# Patient Record
Sex: Female | Born: 1950
Health system: Southern US, Community
[De-identification: ages and names within clinical notes are randomized; demographics above are authoritative.]

## PROBLEM LIST (undated history)

## (undated) DIAGNOSIS — G4730 Sleep apnea, unspecified: Secondary | ICD-10-CM

## (undated) DIAGNOSIS — I10 Essential (primary) hypertension: Secondary | ICD-10-CM

## (undated) DIAGNOSIS — Z8489 Family history of other specified conditions: Secondary | ICD-10-CM

## (undated) DIAGNOSIS — K859 Acute pancreatitis without necrosis or infection, unspecified: Secondary | ICD-10-CM

## (undated) DIAGNOSIS — M1990 Unspecified osteoarthritis, unspecified site: Secondary | ICD-10-CM

## (undated) DIAGNOSIS — Z9289 Personal history of other medical treatment: Secondary | ICD-10-CM

## (undated) DIAGNOSIS — D649 Anemia, unspecified: Secondary | ICD-10-CM

## (undated) DIAGNOSIS — Z8639 Personal history of other endocrine, nutritional and metabolic disease: Secondary | ICD-10-CM

## (undated) DIAGNOSIS — M069 Rheumatoid arthritis, unspecified: Secondary | ICD-10-CM

## (undated) DIAGNOSIS — Z974 Presence of external hearing-aid: Secondary | ICD-10-CM

## (undated) DIAGNOSIS — J45909 Unspecified asthma, uncomplicated: Secondary | ICD-10-CM

## (undated) DIAGNOSIS — J40 Bronchitis, not specified as acute or chronic: Secondary | ICD-10-CM

## (undated) DIAGNOSIS — E039 Hypothyroidism, unspecified: Secondary | ICD-10-CM

## (undated) DIAGNOSIS — Z889 Allergy status to unspecified drugs, medicaments and biological substances status: Secondary | ICD-10-CM

## (undated) DIAGNOSIS — Z973 Presence of spectacles and contact lenses: Secondary | ICD-10-CM

## (undated) HISTORY — DX: Personal history of other endocrine, nutritional and metabolic disease: Z86.39

## (undated) HISTORY — PX: CHOLECYSTECTOMY: SHX55

## (undated) HISTORY — PX: COLONOSCOPY: SHX174

## (undated) HISTORY — DX: Unspecified asthma, uncomplicated: J45.909

## (undated) HISTORY — DX: Rheumatoid arthritis, unspecified: M06.9

## (undated) HISTORY — PX: OVARIAN CYST SURGERY: SHX726

---

## 2001-04-21 ENCOUNTER — Encounter (HOSPITAL_COMMUNITY): Admission: RE | Admit: 2001-04-21 | Discharge: 2001-05-21 | Payer: Self-pay | Admitting: Rheumatology

## 2001-07-21 ENCOUNTER — Encounter (HOSPITAL_COMMUNITY): Admission: RE | Admit: 2001-07-21 | Discharge: 2001-08-20 | Payer: Self-pay | Admitting: Rheumatology

## 2001-11-10 ENCOUNTER — Encounter (HOSPITAL_COMMUNITY): Admission: RE | Admit: 2001-11-10 | Discharge: 2001-12-10 | Payer: Self-pay | Admitting: Rheumatology

## 2002-03-02 ENCOUNTER — Encounter (HOSPITAL_COMMUNITY): Admission: RE | Admit: 2002-03-02 | Discharge: 2002-04-01 | Payer: Self-pay | Admitting: Rheumatology

## 2002-05-30 ENCOUNTER — Encounter (HOSPITAL_COMMUNITY): Admission: RE | Admit: 2002-05-30 | Discharge: 2002-06-29 | Payer: Self-pay | Admitting: Rheumatology

## 2002-08-23 ENCOUNTER — Encounter (HOSPITAL_COMMUNITY): Admission: RE | Admit: 2002-08-23 | Discharge: 2002-09-22 | Payer: Self-pay | Admitting: Rheumatology

## 2002-11-23 ENCOUNTER — Encounter (HOSPITAL_COMMUNITY): Admission: RE | Admit: 2002-11-23 | Discharge: 2002-12-23 | Payer: Self-pay | Admitting: Rheumatology

## 2003-01-11 ENCOUNTER — Encounter (HOSPITAL_COMMUNITY): Admission: RE | Admit: 2003-01-11 | Discharge: 2003-02-10 | Payer: Self-pay | Admitting: Rheumatology

## 2003-02-28 ENCOUNTER — Encounter (HOSPITAL_COMMUNITY): Admission: RE | Admit: 2003-02-28 | Discharge: 2003-03-30 | Payer: Self-pay | Admitting: Rheumatology

## 2003-05-10 ENCOUNTER — Encounter (HOSPITAL_COMMUNITY): Admission: RE | Admit: 2003-05-10 | Discharge: 2003-06-09 | Payer: Self-pay | Admitting: Rheumatology

## 2003-05-24 ENCOUNTER — Encounter: Payer: Self-pay | Admitting: Rheumatology

## 2003-08-30 ENCOUNTER — Encounter (HOSPITAL_COMMUNITY): Admission: RE | Admit: 2003-08-30 | Discharge: 2003-09-29 | Payer: Self-pay | Admitting: Rheumatology

## 2004-10-25 ENCOUNTER — Ambulatory Visit (HOSPITAL_COMMUNITY): Admission: RE | Admit: 2004-10-25 | Discharge: 2004-10-25 | Payer: Self-pay | Admitting: Internal Medicine

## 2006-09-17 ENCOUNTER — Inpatient Hospital Stay (HOSPITAL_COMMUNITY): Admission: RE | Admit: 2006-09-17 | Discharge: 2006-09-22 | Payer: Self-pay | Admitting: Family Medicine

## 2006-09-20 ENCOUNTER — Encounter (INDEPENDENT_AMBULATORY_CARE_PROVIDER_SITE_OTHER): Payer: Self-pay | Admitting: Specialist

## 2006-09-21 ENCOUNTER — Ambulatory Visit: Payer: Self-pay | Admitting: Internal Medicine

## 2006-09-28 ENCOUNTER — Ambulatory Visit (HOSPITAL_COMMUNITY): Admission: RE | Admit: 2006-09-28 | Discharge: 2006-09-28 | Payer: Self-pay | Admitting: General Surgery

## 2006-10-04 ENCOUNTER — Ambulatory Visit (HOSPITAL_COMMUNITY): Admission: RE | Admit: 2006-10-04 | Discharge: 2006-10-04 | Payer: Self-pay | Admitting: Internal Medicine

## 2010-05-19 ENCOUNTER — Ambulatory Visit (HOSPITAL_COMMUNITY): Admission: RE | Admit: 2010-05-19 | Discharge: 2010-05-19 | Payer: Self-pay | Admitting: Endocrinology

## 2011-03-27 NOTE — H&P (Signed)
Laura Allen, Laura Allen             ACCOUNT NO.:  [id number]   MEDICAL RECORD NO.:  [id number]          PATIENT TYPE:  INP   LOCATION:  A339                          FACILITY:  [facility]   PHYSICIAN:  Dalia Heading, M.D.  DATE OF BIRTH:  1951-02-12   DATE OF ADMISSION:  09/17/2006  DATE OF DISCHARGE:  LH                                HISTORY & PHYSICAL   CHIEF COMPLAINT:  Epigastric pain, nausea, vomiting.   HISTORY OF PRESENT ILLNESS:  The patient is a 60 year old white female who  presents with a several-day history of worsening epigastric pain, nausea,  vomiting.  She was seen by Dr. Patrica Duel and sent from his office for an  ultrasound of the gallbladder.  She was found to have acute cholecystitis  with her common bile duct at the upper limits of normal.  Her gallbladder  wall was thickened.   She was then sent to the emergency room for further evaluation/treatment.   PAST MEDICAL HISTORY:  1. Arthritis.  2. Mild hypertension.   PAST SURGICAL HISTORY:  Exploratory laparotomy in the remote past.   CURRENT MEDICATIONS:  Hydrochlorothiazide. __________  .   ALLERGIES:  1. LEVAQUIN.  2. STREPTOMYCIN.   REVIEW OF SYSTEMS:  Noncontributory.   PHYSICAL EXAMINATION:  GENERAL:  The patient is a pleasant 60 year old white  female who is mildly obese in no acute distress.  VITAL SIGNS:  She is afebrile.  Vital signs stable.  HEENT:  Reveals no scleral icterus.  LUNGS:  Clear to auscultation with equal breath sounds bilaterally.  HEART:  Reveals a regular rate and rhythm without S3, S4, or murmurs.  ABDOMEN:  Soft with tenderness noted in the right upper quadrant and  epigastric region.  No rigidity is noted.  No masses noted.  RECTAL:  Deferred at this time.   White blood cell count 21, hematocrit 41, platelet count 433.  MET-7 is  remarkable for a glucose 135.  Liver enzyme tests revealed total bilirubin  of 3.7, alkaline phosphatase of 306, SGOT 311, SGPT 226, calcium  9.8,  amylase 1422, lipase 1122.   IMPRESSION:  Gallstone pancreatitis.   PLAN:  The patient will be admitted to the hospital for control for nausea,  vomiting, and pain.  She will be hydrated for her acute pancreatitis.  Once  this has resolved, she will need a laparoscopic cholecystectomy with  cholangiograms.  Should her liver enzyme test worsen, she may need a  preoperative ERCP.  Further management is pending her course.      Dalia Heading, M.D.  Electronically Signed     MAJ/MEDQ  D:  09/18/2006  T:  09/18/2006  Job:  [id number]   cc:   Patrica Duel, M.D.  Fax: [phone]

## 2011-03-27 NOTE — Discharge Summary (Signed)
NAME:  Laura Allen, COLQUHOUN             ACCOUNT NO.:  [id number]   MEDICAL RECORD NO.:  [id number]          PATIENT TYPE:  INP   LOCATION:  A339                          FACILITY:  [facility]   PHYSICIAN:  Dalia Heading, M.D.  DATE OF BIRTH:  [birth year]   DATE OF ADMISSION:  09/17/2006  DATE OF DISCHARGE:  11/14/2007LH                                 DISCHARGE SUMMARY   HOSPITAL COURSE SUMMARY:  The patient is a 60 year old white female who  presented to [facility] with right upper quadrant abdominal pain,  nausea, vomiting.  Ultrasound of the gallbladder revealed cholelithiasis  with a dilated common bile duct.  Her blood tests revealed that she had  gallstone pancreatitis with elevated LFTs as well as amylase and lipase.  She was admitted to the hospital for further evaluation and treatment.  Once  her pancreatitis resolved, she underwent a laparoscopic cholecystectomy with  cholangiograms.  This was performed on September 20, 2006.  She was found to  have choledocholithiasis at the time of surgery.  She tolerated the surgery  well.  Dr. Karilyn Cota of Gastroenterology was consulted and the following day,  the patient underwent ERCP with stone extraction.  She tolerated this  procedure well.  Her postoperative course was remarkable for a mild fever  which has since resolved.  Her liver enzyme tests were almost normal.  Her  total bilirubin was 0.5.  Her amylase was 35 which was normal.  Her white  blood cell count was within normal limits.  Hematocrit was 30 and felt to be  secondary to multiple procedures and hydration.   DISPOSITION:  The patient is being discharged home on September 22, 2006, in  good, improving condition.   DISCHARGE INSTRUCTIONS:  The patient is to follow up Dr. Franky Macho on  September 28, 2006.   DISCHARGE MEDICATIONS:  Vicodin 1-2 tablets p.o. q.4h. p.r.n. pain.  She is  to resume all her other medications as previously prescribed.   PRINCIPAL DIAGNOSES:  1.  Gallstone pancreatitis.  2. Choledocholithiasis.   PRINCIPAL PROCEDURES:  1. Laparoscopic cholecystectomy with cholangiograms on September 20, 2006,      by Dr. Franky Macho.  2. ERCP with stone extraction by Dr. Karilyn Cota on September 21, 2006.      Dalia Heading, M.D.  Electronically Signed     MAJ/MEDQ  D:  09/22/2006  T:  09/22/2006  Job:  [id number]   cc:   Dalia Heading, M.D.  Fax: [phone]   Lionel December, M.D.  [address]  [place]  [place] [address]   Patrica Duel, M.D.  Fax: [phone]

## 2011-03-27 NOTE — Consult Note (Signed)
[facility]  Patient:    Laura Allen, Laura Allen Visit Number: [id number] MRN: [id number]          Service Type: RHE Location: [facility] Attending Physician:  Aundra Dubin Dictated by:   Nathaneil Canary, M.D. Proc. Date: 03/02/02 Admit Date:  03/02/2002   CC:         Colette Ribas, M.D.   Consultation Report  CHIEF COMPLAINT:  Rheumatoid arthritis.  HISTORY OF PRESENT ILLNESS:  The patient has overall had a good winter even though it has been quite cold and damp. She has had minor flares to her hands. She has had to use no prednisone during this time. She had some sinus problems related to allergies but there is no chronic cough. She has had no fever, URIs, shortness of breath, nausea, or stomatitis. Her weight is up five pounds. There is no polyuria, polydipsia.  CURRENT MEDICATIONS:  Methotrexate 15 mg q. week, folic acid 1 mg q.d., Arava 20 mg q.d., Tums 500 mg t.i.d., Zyrtec 10 mg p.r.n., Allegra q.d.  PHYSICAL EXAMINATION:  VITAL SIGNS:  Weight 218 pounds. Blood pressure 132/88, respirations 16.  GENERAL:  No distress.  SKIN:  Clear.  LUNGS:  Clear.  HEART:  Regular, no murmur.  LOWER EXTREMITIES:  No edema.  MUSCULOSKELETAL:  The hands and wrists have soft, cool, and nontender chronic synovitis. There is no guarding with flexion at the wrists. The elbows extend fully and there are no nodules. Shoulders have some mild stiffness and a good range of motion. Back nontender. Knees are cool and flex easily to 130 degrees. The ankles and feet were nontender.  ASSESSMENT AND PLAN:  Rheumatoid arthritis. She is doing quite well. She does have about 30 minutes of early morning stiffness but overall, is quite pleased at how she is doing. We will continue the medicines, as above. I have pointed out again that I would like to check the labs every three months. We are setting her up for that and will also check today.  She will return in four  months. Dictated by:   Nathaneil Canary, M.D. Attending Physician:  Aundra Dubin DD:  03/02/02 TD:  03/02/02 Job: [id number]

## 2011-03-27 NOTE — Op Note (Signed)
Laura Allen, DOUGAN             ACCOUNT NO.:  [id number]   MEDICAL RECORD NO.:  [id number]          PATIENT TYPE:  INP   LOCATION:  A339                          FACILITY:  [facility]   PHYSICIAN:  Dalia Heading, M.D.  DATE OF BIRTH:  [birth year]   DATE OF PROCEDURE:  09/20/2006  DATE OF DISCHARGE:                                 OPERATIVE REPORT   PREOPERATIVE DIAGNOSES:  Cholecystitis, cholelithiasis.   POSTOPERATIVE DIAGNOSES:  Cholecystitis, cholelithiasis.  Choledocholithiasis.   PROCEDURE:  Laparoscopic cholecystectomy with cholangiograms.   SURGEON:  Dalia Heading, M.D.   ASSISTANT:  Arna Snipe, M.D.   ANESTHESIA:  General endotracheal.   INDICATIONS:  The patient is a 60 year old white female who presented  several days ago with gallstone pancreatitis.  Ultrasound shows  cholelithiasis with a dilated common bile duct.  The patient now comes to  the operating room for laparoscopic cholecystectomy with cholangiograms.  The risks and benefits of the procedure including bleeding, infection,  hepatobiliary injury, the possibility of an open procedure, and the  possibility of a postoperative ERCP were fully explained to the patient who  gave informed consent.   PROCEDURE NOTE:  The patient was placed in the supine position.  After  induction of general endotracheal anesthesia, the abdomen was prepped and  draped using the usual sterile technique with Betadine.  Surgical site  confirmation was performed.   A supraumbilical incision was made down to the fascia.  Veress needle was  introduced into the abdominal cavity and confirmation of placement was done  using the saline drop test.  The abdomen was then insufflated to 16 mmHg  pressure.  An 11-mm trocar was introduced into the abdominal cavity under  direct visualization without difficulty.  The patient was placed in reversed  Trendelenburg position.  Additional 11-mm trocar was placed in the  epigastric region and  5-mm trocars were placed in the right upper quadrant  and right flank regions.  Liver was inspected and noted to be within normal  limits.  The gallbladder was retracted superior and laterally.  The  dissection was begun around the infundibulum of the gallbladder.  The cystic  duct was first identified.  Its junction to the infundibulum was fully  identified.  A single Endoclip was placed proximally on the cystic duct.  An  incision was made into the cystic duct, and a cholangiocatheter was  inserted.  Under digital fluoroscopy, cholangiograms were performed.  There  were multiple filling defects in the distal common bile duct consistent with  choledocholithiasis.  The dye did flow into the duodenum.  Several attempts  at passing a basket through the cystic duct were made, but it could not be  passed.  It was elected to abort any further attempts at getting the common  bile duct stones.  Multiple Endoclips were placed distally on the cystic  duct, and the cystic duct was divided.  The cystic artery was likewise  ligated and divided.  The gallbladder was then freed away from the  gallbladder fossa using Bovie electrocautery.  The gallbladder was delivered  through the epigastric trocar  site using EndoCatch bag.  The gallbladder  fossa was inspected, and no abnormal bleeding or bile leakage was noted.  Surgicel was placed in the gallbladder fossa.  All fluid and air were then  evacuated from the abdominal cavity prior to removal of the trocars.   All wounds were irrigated with normal saline.  All wounds were injected with  0.5% Sensorcaine.  The supraumbilical fascia was reapproximated using an 0  Vicryl interrupted suture.  All skin incisions were closed using staples.  Betadine ointment and dry sterile dressings were applied.   All tape and needle counts were correct at the end of the procedure.  The  patient was extubated in the operating room and went back to recovery room  awake and in  stable condition.   COMPLICATIONS:  None.   SPECIMEN:  Gallbladder with stones.   BLOOD LOSS:  Minimal.      Dalia Heading, M.D.  Electronically Signed     MAJ/MEDQ  D:  09/20/2006  T:  09/20/2006  Job:  [id number]   cc:   Lionel December, M.D.  [address]  [place]  [place] [address]   Patrica Duel, M.D.  Fax: [phone]

## 2011-03-27 NOTE — Consult Note (Signed)
   NAME:  Laura Allen, Puerta NO.:  [id number]   MEDICAL RECORD NO.:  [id number]                   PATIENT TYPE:   LOCATION:                                       FACILITY:   PHYSICIAN:  Aundra Dubin, M.D.            DATE OF BIRTH:   DATE OF CONSULTATION:  01/11/2003  DATE OF DISCHARGE:                                   CONSULTATION   CHIEF COMPLAINT:  Rheumatoid arthritis.   HISTORY OF PRESENT ILLNESS:  The patient reports that she has had some mild  soreness to the left wrist.  This really does not limit her from daily  activities and lifting.  She has had a fairly good winter with no major  flares.  She feels overall that her hands and wrists and other joints are  doing well.  Her weight is down 3 pounds.  There have been no URIs, fever,  cough, shortness of breath, nausea, or stomatitis.   Labs on November 23, 2002 showed an albumin 4.3, ALT 27, AST 32, creatinine  0.7, WBC 9.1, HGB 14.2, PLT 382.   MEDICATIONS:  1. Methotrexate 15 mg every week.  2. Folic acid 1 mg daily.  3. Arava 20 mg daily.  4. Tums 500 mg t.i.d.  5. Allegra p.r.n.  6. Tylenol p.r.n.   PHYSICAL EXAMINATION:  VITAL SIGNS:  Weight 215 pounds, blood pressure  118/80, respirations 16.  GENERAL:  She appears well.  SKIN:  Clear.  LUNGS:  Clear.  NECK:  No adenopathy, negative JVD.  LOWER EXTREMITIES:  No edema.  MUSCULOSKELETAL:  The hands at the MCPs show chronic synovitis but they are  cool and nontender.  She is tender along the left radial area.  She has a  positive Finkelstein's test.  Elbows, shoulders, knees, ankles, and feet  have a good range of motion and show no active arthritis.  The knees have  crepitation.    ASSESSMENT AND PLAN:  Rheumatoid arthritis.  She is stable and the labs are  good.  She will continue on the Arava and methotrexate as above.  She rarely  needs prednisone on a p.r.n. basis.   She will return in mid April for repeat labs and I  will see her back in four  months.                                               Aundra Dubin, M.D.    WWT/MEDQ  D:  01/11/2003  T:  01/11/2003  Job:  [id number]   cc:   Kirk Ruths, M.D.  [address]  [place]  [place] [address]  Fax: [phone]

## 2011-03-27 NOTE — Consult Note (Signed)
NAME:  Laura Allen, Laura Allen NO.:  [id number]   MEDICAL RECORD NO.:  [id number]                   PATIENT TYPE:   LOCATION:                                       FACILITY:   PHYSICIAN:  Aundra Dubin, M.D.            DATE OF BIRTH:   DATE OF CONSULTATION:  05/10/2003  DATE OF DISCHARGE:                                   CONSULTATION   CHIEF COMPLAINT:  Rheumatoid arthritis.   Laura Allen reports that her right wrist and hand have been quite sore and  swollen for about two weeks.  She has generally had a good four months.  She  is not having nausea, rashes, URIs, fever, cough, shortness of breath, or  headaches.  Her weight is up 3 pounds.  She has had fairly significant  congestion and problems with her sinuses over this spring.   MEDICATIONS:  1. Arava 20 mg daily.  2. Methotrexate 20 mg every week.  3. Folic acid 1 mg daily.  4. Tums 500 mg t.i.d.  5. Tylenol p.r.n.  6. Allegra p.r.n.   PHYSICAL EXAMINATION:  VITAL SIGNS:  Weight 218 pounds, blood pressure  138/70, respirations 16.  GENERAL:  No distress.  LUNGS:  They have mild rhonchi in both lungs.  NECK:  No adenopathy, negative JVD.  HEART:  Regular, no murmur.  LOWER EXTREMITIES:  No edema.  MUSCULOSKELETAL:  She does have active swelling to the right MCPs which are  warm.  The dorsal hand and wrist are swollen and tender.  The left hand and  wrist have chronic RA changes but are nontender.  Elbows, shoulders, knees,  ankles, feet have a good range of motion with slight stiffness and no pain.   ASSESSMENT AND PLAN:  1. Rheumatoid arthritis.  This is the first significant flare that I have     seen with her in probably over a year.  We will give her an injection of     120 mg of Depo-Medrol today.  She will continue with the methotrexate and     Arava as above.  Labs checked on February 28, 2003 showed an albumin 3.8,     ALT 20, AST 25, creatinine 0.7, WBC 8.7, HGB 12.6, PLT 351.   Labs will be     checked again on the third week of July.  At that time we will have her     hands     x-rayed.  2. Weight gain.   She will return in four months.                                               Aundra Dubin, M.D.    WWT/MEDQ  D:  05/10/2003  T:  05/10/2003  Job:  [id number]  cc:   Kirk Ruths, M.D.  [address]  [place]  [place] [address]  Fax: [phone]

## 2011-03-27 NOTE — Consult Note (Signed)
NAME:  Laura Allen, Laura Allen             ACCOUNT NO.:  [id number]   MEDICAL RECORD NO.:  [id number]          PATIENT TYPE:  INP   LOCATION:  A339                          FACILITY:  [facility]   PHYSICIAN:  Lionel December, M.D.    DATE OF BIRTH:  08/17/1951   DATE OF CONSULTATION:  09/20/2006  DATE OF DISCHARGE:                                   CONSULTATION   REASON FOR CONSULTATION:  Choledocholithiasis; asked to see the patient for  ERCP.   INDICATIONS:  Asked to see the patient for ERCP by Dr. Lovell Sheehan.   HISTORY OF PRESENT ILLNESS:  Laura Allen is a 60 year old Caucasian female who  was in her usual state of health until about a week ago, when she noted  infrascapular shoulder pain.  Initially she thought this was related to her  rheumatoid arthritis.  However, pain became more intense and also  experienced right upper quadrant pain .  She was seen by Dr. Regino Schultze last  week and noted to have elevated transaminases.  She had ultrasound which  revealed cholelithiasis.  CBD was 6 mm and without evidence of  choledocholithiasis.  The patient was admitted to this facility 2 days ago.  She was seen in consultation by Dr. Lovell Sheehan.  She had laparoscopic  cholecystectomy earlier today.  She had intraoperative cholangiogram, given  elevated transaminases.  She was noted to have multiple stones in the bile  duct, which Dr. Lovell Sheehan was not able to flush out.  She remains with the  right upper quadrant pain.  She describes this pain to be the same pain that  she had prior to the cholecystectomy.  She feels better with pain medicine.  She denies chronic heartburn, dysphagia, hematemesis, melena or rectal  bleeding.  She has had screening colonoscopy with Dr. Jena Gauss 7 or 8 years  ago.   MEDICINES AT HOME:  1. Hydrochlorothiazide 25 mg q.a.m.  2. Areva.   PRESENT MEDICATIONS:  1. Zosyn 3.375 grams IV q.6 h.  2. Dilaudid 4.5 mg to 1 mg IV q.3 h. p.r.n. pain.  3. Zofran 4 mg IV q.6 h. p.r.n.  4. Lortab 1-2  q.4 h. p.r.n.  5. Tylenol p.r.n.  6. Maalox p.r.n.   PAST MEDICAL HISTORY:  1. Rheumatoid arthritis was diagnosed in 1994, and she is under care of      Dr. Aundra Dubin.  Periodically she has required short courses of      prednisone.  2. She has hypertension, diagnosed about 6 months ago.  3. She has history of asthmatic bronchitis.  4. History of ovarian cyst.   PAST SURGICAL HISTORY:  She had a laparotomy in 1977 for ruptured large  ovarian cyst, and she had part of her ovary taken out; she believes she had  tubal ligation at the same time.  She had another laparoscopy in 1980.  She  had colonoscopy 7 or 8 years ago, which was normal.   ALLERGIES:  STREPTOMYCIN and LEVAQUIN, causing rash and itching.   FAMILY HISTORY:  Both parents are in fair to good health.  Father has  noninsulin-dependent diabetes mellitus.  She has 2 brothers in good health.   SOCIAL HISTORY:  She is married.  She works at [employer] with [employer]; she  has a Office manager.  She has two children in good health.  She has never smoked  cigarettes or drank alcohol.   PHYSICAL EXAM:  GENERAL:  Pleasant, mildly obese Caucasian female who is in  no acute distress.  She weighs 100.9 kg.  She is 67 inches tall.  VITAL SIGNS:  Pulse 100 per minute, blood pressure 150/86, temperature is  101.2 and respirations 20.  HEENT:  Conjunctivae is pink.  Sclera is nonicteric.  Oropharyngeal mucosa  is normal.  NECK:  No neck masses are noted.  CARDIAC:  Exam with regular rhythm.  Normal S1-S2.  No murmur or gallop  noted.  LUNGS:  Clear to auscultation.  ABDOMEN:  Full.  She has Steri-Strips covering laparoscopy wounds.  Bowel  sounds are normal.  Abdomen is soft with mild to moderate tenderness at  right upper quadrant.  No organomegaly or masses noted.  RECTAL:  Examination deferred.  EXTREMITIES:  No clubbing or edema noted.   LABS ON ADMISSION:  (November 10, /2007) WBC 7.8, H&H was 11.1 and 32.90,  MCV 79.6,  platelet count 318,000.  Bilirubin 1.7, direct bilirubin 4.8, AP  228, AST 109, ALT 130.  Sodium 137, potassium 3.2, chloride 102, CO2 30,  glucose 140.  Albumin 3.0, calcium 8.3.  Amylase 434, lipase 520.   LABS FROM THIS MORNING:  WBC 8.2.  Potassium 4.2.  LFTs from yesterday:  Bilirubin 0.8, AP 197, AST 46, ALT 86.  Amylase 140, lipase 135.  Cholangiogram reviewed with Dr. Janece Canterbury.  Mildly dilated CBD and chest  discomfort with multiple stones and CBD.  There is some contrast in the  duodenum.   ASSESSMENT:  Laura Allen is a 60 year old Caucasian female who underwent  laparoscopic cholecystectomy along with intraoperative cholangiogram, and  noted to have choledocholithiasis.  Her LFTs preoperatively were elevated,  as was amylase and lipase -- indicating biliary pancreatitis.  She will  therefore need ERCP to clear her duct of these stones.  She has low-grade  fever, which may be due to cholangitis postoperatively, or due to  pancreatitis.  The patient does not appear to be toxic.  I agree with  treating with Zosyn.   RECOMMENDATIONS:  She will have PT/PTT in a.m., along with type and hold for  2 units.  She will also have LFTs, amylase and lipase repeated.  ERCP with sphincterotomy and stone extraction.  I reviewed the procedures  with the patient at length, along with her husband and their daughter.  I  informed her that if her selective cannulation is difficult, pancreatic  stent may be  left in place in order to minimize risk of flaring up pancreatitis.  Other  risks were also reviewed with the patient.  She is agreeable.  The procedure  will be performed on September 21, 2006.   We would like to thank Dr. Lovell Sheehan for the opportunity to participate in the  care of this nice lady.      Lionel December, M.D.  Electronically Signed     NR/MEDQ  D:  09/20/2006  T:  09/20/2006  Job:  [id number]

## 2011-03-27 NOTE — Consult Note (Signed)
   NAME:  Laura Allen, Laura Allen                      ACCOUNT NO.:  [id number]   MEDICAL RECORD NO.:  [id number]                   PATIENT TYPE:   LOCATION:                                       FACILITY:   PHYSICIAN:  Aundra Dubin, M.D.            DATE OF BIRTH:   DATE OF CONSULTATION:  06/29/2002  DATE OF DISCHARGE:                             RHEUMATOLOGY CONSULTATION   CHIEF COMPLAINT:  Rheumatoid arthritis.   HISTORY OF PRESENT ILLNESS:  The patient reports that her right hand has  been swelling off and on during the summer.  It flared up about two weeks  ago and still hurts at this time.  Otherwise, most other joints have not  bothered her.  She has stiffness in the right hand for about an hour in the  mornings.  She denies any URIs, fever, cough, nausea, stomatitis, or  shortness of breath.  There has been no back pain.   MEDICATIONS:  1. Arava 20 mg q.d.  2. Folic acid 1 mg q.d.  3. Methotrexate 15 mg q.d.  4. Tums 500 mg t.i.d.  5. Zyrtec 10 mg q.d.  6. Allegra q.d.   PHYSICAL EXAMINATION:  VITAL SIGNS:  Weight 218 pounds, blood pressure  130/80, respirations 18.  GENERAL:  No distress.  LUNGS:  Clear.  HEART:  Regular with no murmur.  NECK:  Negative JVD.  EXTREMITIES:  Lower extremities no edema.  MUSCULOSKELETAL:  The right hand shows swollen second and third MCPs which  are warm and quite tender.  The right wrist also is warm and tender.  The  left wrist has mild swelling and mild tenderness.  Elbows extend well.  Shoulders:  Good range of motion.  Back:  Nontender.  Knees, ankles, and  feet have a good range of motion and are nontender.   ASSESSMENT/PLAN:  Rheumatoid arthritis.  She is having a moderate flare.  I  have decided to give her 120 mg of Depo Medrol IM and not use daily  prednisone.  The methotrexate will be increased to 20 mg q.week and she will  continue with the Arava.  Laboratories checked on May 30, 2002 showed a WBC  7.3, HGB 13.4, PLT  377,000, albumin 3.9, AST 23, ALT 23, creatinine 0.7.   She will return in about four weeks for laboratories to be checked again and  I will see her in three months.                                               Aundra Dubin, M.D.   WWT/MEDQ  D:  06/29/2002  T:  06/29/2002  Job:  [id number]

## 2011-03-27 NOTE — Consult Note (Signed)
NAME:  Laura Allen, Thies NO.:  [id number]   MEDICAL RECORD NO.:  [id number]                   PATIENT TYPE:   LOCATION:                                       FACILITY:   PHYSICIAN:  Aundra Dubin, M.D.            DATE OF BIRTH:   DATE OF CONSULTATION:  08/30/2003  DATE OF DISCHARGE:                                   CONSULTATION   CHIEF COMPLAINT:  Rheumatoid arthritis.   HISTORY OF PRESENT ILLNESS:  Laura Allen did have her hands x-rayed in July,  and I have evaluated these.  The x-rays show that she has continued joint  space loss of the second and third bilateral MCPs, but there is little  change.  There has been no interval development of erosions since December  2000.  The carpal bone shows some crowding.  There is sclerosis changes more  to the right carpal area.  Overall, these x-rays show little change.  Ms.  Allen tells me that she is doing well.  She has had some mild flaring, but  she says these have not been severe.  She aches more with rainy weather.  Her weight is stable.  There has been no URIs, fever, cough, shortness of  breath, nausea, or stomatitis.   LABORATORY DATA:  Labs on May 24, 2003, showed WBC 10.6, hemoglobin 13.2,  platelets 445, creatinine 0.8, ALT 17, albumin 3.8.   MEDICATIONS:  1. Methotrexate 20 mg q. week.  2. Arava 20 mg q. day.  3. Folic acid 1 mg q. day.  4. Tums 500 mg t.i.d.  5. Tylenol p.r.n.  6. Allegra p.r.n.   PHYSICAL EXAMINATION:  VITAL SIGNS:  Weight 217 pounds, blood pressure  120/68, respirations 16.  GENERAL:  She appears well.  LUNGS:  Clear.  HEART:  Regular, no murmur.  EXTREMITIES:  Lower extremities with no edema.  SKIN:  Normal.  MUSCULOSKELETAL:  She continues with the chronic synovitis to the MCPs and  wrists which are cool and have minor tenderness.  Elbows extend fully and  there is no nodule.  Shoulders good range of motion.  Knees have mild  crepitation, but were  nontender.  The ankles and feet were nontender.    ASSESSMENT AND PLAN:  Rheumatoid arthritis.  She is stable and we will  continue the Arava and methotrexate as above.  We will check usual labs  today.  She will go to Dr. Edison Simon office in January for labs to include a  CBC, ALT, AST, creatinine, and albumin.   She will return to my [place] office in mid-February.      ___________________________________________                                            Aundra Dubin, M.D.  WWT/MEDQ  D:  08/30/2003  T:  08/30/2003  Job:  [id number]   cc:   Kirk Ruths, M.D.  [address]  [place]  [place] [address]  Fax: [phone]

## 2011-03-27 NOTE — Op Note (Signed)
NAME:  Laura Allen, Laura Allen             ACCOUNT NO.:  [id number]   MEDICAL RECORD NO.:  [id number]          PATIENT TYPE:  INP   LOCATION:  A339                          FACILITY:  [facility]   PHYSICIAN:  Lionel December, M.D.    DATE OF BIRTH:  [birth year]   DATE OF PROCEDURE:  09/21/2006  DATE OF DISCHARGE:                                 OPERATIVE REPORT   PROCEDURE:  ERCP with sphincterotomy and stone extraction and PD stenting.   INDICATIONS:  Lenox is a 60 year old Caucasian female who presented with  biliary pancreatitis.  She had laparoscopic cholecystectomy yesterday with  intraoperative cholangiogram revealing multiple stones.  She is therefore  undergoing therapeutic procedure.  Procedure risks were reviewed with the  patient and informed consent was obtained.   MEDICATIONS FOR CONSCIOUS SEDATION AND ANESTHESIA:  Please see anesthesia  records for details.   FINDINGS:  Procedure was performed in OR.  After the patient was given  endotracheal anesthesia, she was positioned in semiprone position.  Therapeutic Olympus video duodenoscope was passed per oropharynx without any  difficulty into stomach and across the pylorus and bulb and descending  duodenum.  Ampulla of Vater was identified was normal appearing.  Initially  a PE was cannulated and gently filled with dilute contrast and no defects  were noted.  The CBD then selectively cannulated, the RX44 autotome and 0.35  Hydra Jagwire.  CBD and CHD were mildly dilated at 7-8 mm in diameter.  The  intrahepatic radicals were normal.  Multiple filling defect noted at CBD  consistent with stones.  The sphincterotomy was performed.  Stone balloon  extractor was passed through bile duct multiple times and at least six or  more stones were removed and duct was cleared of stones and debris.  Given  history of biliary pancreatitis, it was decided to leave the pancreatic  stent in place to minimize the risk of pancreatitis or flaring up her  pancreatitis.  PD was cannulated with the same autotome and 0.35 guidewire  was in the left in the pancreatic duct.  A 4-French 5 cm long pigtail  Freeman stent was pushed over the guidewire with a sphincterotome.  It was  deployed, but still connected to a scope and finally pushed out of the  channel using the snare.  Scope was withdrawn.  The patient tolerated the  procedure well and she was extubated and brought to PACU.   FINAL DIAGNOSIS:  Mildly dilated common bile duct and common hepatic duct  with multiple stones.  Sphincterotomy performed and all stones were removed.   Normal pancreatic duct.  Pancreatic duct stent left in place to reduce risk  of flaring up of her pancreatitis.   PLAN:  Clear liquids today.  Will leave her on antibiotics the rest of the  day.  Low-fat diet in a.m.  She will have her CBC, LFTs and amylase.  She  will have plain abdominal film in 10 days unless she is able to recognize  the stent in her feces.  She will also have LFTs when she sees Dr. Lovell Sheehan  for follow-up.  Lionel December, M.D.  Electronically Signed     NR/MEDQ  D:  09/21/2006  T:  09/21/2006  Job:  [id number]   cc:   Kirk Ruths, M.D.  Fax: [phone]

## 2011-03-27 NOTE — Consult Note (Signed)
   NAMEDEJANA, PUGSLEY                         ACCOUNT NO.:  [id number]   MEDICAL RECORD NO.:  [id number]                  PATIENT TYPE:   LOCATION:                                       FACILITY:   PHYSICIAN:  Aundra Dubin, M.D.            DATE OF BIRTH:   DATE OF CONSULTATION:  09/21/2002  DATE OF DISCHARGE:                                   CONSULTATION   CHIEF COMPLAINT:  Rheumatoid arthritis.   HISTORY OF PRESENT ILLNESS:  The patient returns reporting that she is doing  better than she was in August.  She has transient pain in various joints.  It seems to last a few days, but overall feels her pain is at a low level.  She is not having any back pain, polyuria, polydipsia.  There have been no  URIs, fever, cough, stomatitis, or shortness of breath.   MEDICATIONS:  1. Arava 20 mg q.d.  2. Methotrexate 20 mg q.week.  3. Folic acid 1 mg q.d.  4. Tums 500 mg t.i.d.  5. Zyrtec 10 mg q.d.  6. Allegra q.d.   PHYSICAL EXAMINATION:  VITAL SIGNS:  Weight 218 pounds, blood pressure  128/80, respirations 16.  GENERAL:  No distress.  LUNGS:  Clear.  HEART:  Regular.  No murmur.  EXTREMITIES:  Lower extremities no edema.  MUSCULOSKELETAL:  She continues to have moderate chronic synovitis of the  MCPs and wrists that are cool and have minor tenderness today.  Elbows  extend fully and have no nodules.  Shoulders:  Good range of motion.  Back:  Nontender.  Knees are cool and flex easily to 130 degrees.  The ankles and  feet were nontender.    ASSESSMENT/PLAN:  Rheumatoid arthritis.  She is stable and we will continue  the medicines as above.  Laboratories checked on August 23, 2002 showed an  albumin 3.7, ALT 20, creatinine 0.6, AST 21.  She had a normal CBC and a  hemoglobin was 13.4.  We will check laboratories again in mid January.   She will return in four months.                                               Aundra Dubin, M.D.    WWT/MEDQ  D:  09/21/2002   T:  09/21/2002  Job:  [id number]   cc:   Corrie Mckusick, M.D.  [address] [place] [place]  [place]  [place] [address]  Fax: [phone]

## 2011-03-27 NOTE — Consult Note (Signed)
[facility]  Patient:    Laura Allen, Laura Allen Visit Number: [id number] MRN: [id number]          Service Type: RHE Location: [facility] Attending Physician:  Aundra Dubin Dictated by:   Aundra Dubin, M.D. Proc. Date: 11/10/01 Admit Date:  11/10/2001   CC:         Assunta Found, M.D.   Consultation Report  CHIEF COMPLAINT:  Rheumatoid arthritis.  HISTORY:  Ms. Min returns reporting that she is doing well.  She has had some mild episodes of achiness and pain, particularly before rainy weather arrives.  She has not needed to use any prednisone since last seeing her in September.  There has been no URI, fever, cough, nausea, shortness of breath, or skin rashes.  There has been no back pain.  MEDICATIONS: 1. Arava 20 mg q.d. 2. Methotrexate 15 mg q.week. 3. Folic acid 1 mg q.d. 4. Tums 500 mg t.i.d.  PHYSICAL EXAMINATION  VITAL SIGNS:  Weight 213 pounds, blood pressure 140/90, respirations 16.  GENERAL:  No distress.  LUNGS:  Clear.  NECK:  Negative JVD.  HEART:  Regular, no murmur.  EXTREMITIES:  Lower extremities:  No edema.  MUSCULOSKELETAL:  She has inactive synovitis across the MCPs and wrists which are cool, soft, and nontender.  Elbows extend fully and have no nodules. Shoulders, neck:  Good range of motion without resistance.  Knees, ankles, and feet have a good range of motion and show no active arthritis.  She does have some mild crepitation to the right knee.  ASSESSMENT AND PLAN:  Rheumatoid arthritis.  She is doing well with the vigorous treatment with methotrexate and Arava.  I usually like to check laboratories every three months and it has gone almost four months at this point.  We will check laboratories today and then in three months.  Laboratories on July 21, 2001 showed an albumin 3.9, ALT 24, AST 27, creatinine 0.7.  Hemoglobin 13.5, WBC 6.3, platelets 343,000.  She will return in four months. Dictated  by:   Aundra Dubin, M.D. Attending Physician:  Aundra Dubin DD:  11/10/01 TD:  11/10/01 Job: [id number]

## 2011-04-14 ENCOUNTER — Other Ambulatory Visit (HOSPITAL_COMMUNITY): Payer: Self-pay | Admitting: Family Medicine

## 2011-04-14 DIAGNOSIS — Z139 Encounter for screening, unspecified: Secondary | ICD-10-CM

## 2011-04-24 ENCOUNTER — Ambulatory Visit (HOSPITAL_COMMUNITY)
Admission: RE | Admit: 2011-04-24 | Discharge: 2011-04-24 | Disposition: A | Discharge status: Home / Self Care | Payer: BC Managed Care – PPO | Source: Ambulatory Visit | Attending: Family Medicine | Admitting: Family Medicine

## 2011-04-24 DIAGNOSIS — Z139 Encounter for screening, unspecified: Secondary | ICD-10-CM

## 2011-04-24 DIAGNOSIS — Z1231 Encounter for screening mammogram for malignant neoplasm of breast: Secondary | ICD-10-CM | POA: Insufficient documentation

## 2011-10-13 ENCOUNTER — Other Ambulatory Visit (HOSPITAL_COMMUNITY): Payer: Self-pay | Admitting: Rheumatology

## 2011-10-13 DIAGNOSIS — R609 Edema, unspecified: Secondary | ICD-10-CM

## 2011-10-13 DIAGNOSIS — M25561 Pain in right knee: Secondary | ICD-10-CM

## 2011-10-16 ENCOUNTER — Ambulatory Visit (HOSPITAL_COMMUNITY)
Admission: RE | Admit: 2011-10-16 | Discharge: 2011-10-16 | Disposition: A | Discharge status: Home / Self Care | Payer: BC Managed Care – PPO | Source: Ambulatory Visit | Attending: Rheumatology | Admitting: Rheumatology

## 2011-10-16 DIAGNOSIS — R609 Edema, unspecified: Secondary | ICD-10-CM

## 2011-10-16 DIAGNOSIS — M7120 Synovial cyst of popliteal space [Baker], unspecified knee: Secondary | ICD-10-CM | POA: Insufficient documentation

## 2011-10-16 DIAGNOSIS — M25569 Pain in unspecified knee: Secondary | ICD-10-CM | POA: Insufficient documentation

## 2011-10-16 DIAGNOSIS — M1710 Unilateral primary osteoarthritis, unspecified knee: Secondary | ICD-10-CM | POA: Insufficient documentation

## 2011-10-16 DIAGNOSIS — M25561 Pain in right knee: Secondary | ICD-10-CM

## 2011-10-16 DIAGNOSIS — IMO0002 Reserved for concepts with insufficient information to code with codable children: Secondary | ICD-10-CM | POA: Insufficient documentation

## 2011-12-03 ENCOUNTER — Encounter: Payer: Self-pay | Admitting: Orthopedic Surgery

## 2011-12-03 ENCOUNTER — Ambulatory Visit (INDEPENDENT_AMBULATORY_CARE_PROVIDER_SITE_OTHER): Payer: BC Managed Care – PPO | Admitting: Orthopedic Surgery

## 2011-12-03 VITALS — BP 106/68 | Ht 67.0 in | Wt 242.0 lb

## 2011-12-03 DIAGNOSIS — M069 Rheumatoid arthritis, unspecified: Secondary | ICD-10-CM

## 2011-12-03 DIAGNOSIS — M1380 Other specified arthritis, unspecified site: Secondary | ICD-10-CM

## 2011-12-03 DIAGNOSIS — M1710 Unilateral primary osteoarthritis, unspecified knee: Secondary | ICD-10-CM

## 2011-12-03 DIAGNOSIS — M1289 Other specific arthropathies, not elsewhere classified, multiple sites: Secondary | ICD-10-CM

## 2011-12-03 DIAGNOSIS — M1712 Unilateral primary osteoarthritis, left knee: Secondary | ICD-10-CM | POA: Insufficient documentation

## 2011-12-03 DIAGNOSIS — IMO0002 Reserved for concepts with insufficient information to code with codable children: Secondary | ICD-10-CM

## 2011-12-03 NOTE — Patient Instructions (Signed)
DR WILL CALL  

## 2011-12-03 NOTE — Progress Notes (Signed)
Patient ID: Laura Allen, female   DOB: [birth year], 61 y.o.   MRN: [id number]   Consultation requested by Dr. Stacey Drain Chief complaint cyst behind the knee  This is a 61 year old female who was diagnosed with rheumatoid factor positive for rheumatoid arthritis in 1994 has been followed for several years by Dr. Leonette Most well.  She has had several cortisone shots in her RIGHT knee most recently in December and has been on a prednisone taper for exacerbation of her rheumatoid symptoms.  An MRI was done on December 7 for persistent symptoms and it showed severe 3 compartment disease worse on the medial compartment with extrusion of the medial meniscus and a large Baker cyst  The patient complains of soreness medially when walking and a grabbing sensation behind her knee associated with swelling.  Her symptoms started suddenly and seemed to come and go and wax and wane.  The pain is described as sharp and throbbing.  She rates the pain 6/10.  As far as activities go the patient notes that she does have trouble getting in and out of a car, getting up from a seated position.  She emulates with a cane.  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  The review of systems is as follows  Positive findings fatigue snoring but she's had a sleep apnea testing is negative, heartburn, joint pain and swelling  Negative findings include blurred vision chest pain frequency changes and skin numbness nervousness bleeding excessive thirst or adverse reaction  Past Surgical History  Procedure Date  . Cholecystectomy   . Ovarian cyst surgery    Past Medical History  Diagnosis Date  . History of underactive thyroid   . Rheumatoid arthritis    History   Social History  . Marital Status: Married    Spouse Name: N/A    Number of Children: N/A  . Years of Education: N/A   Occupational History  . Not on file.   Social History Main Topics  . Smoking status: Never Smoker   . Smokeless tobacco: Not on  file  . Alcohol Use: No  . Drug Use: No  . Sexually Active: Not on file   Other Topics Concern  . Not on file   Social History Narrative  . No narrative on file    Physical examination BP 106/68  Ht 5\' 7"  (1.702 m)  Wt 242 lb (109.77 kg)  BMI 37.90 kg/m2  Gen. Appearance: Grooming and hygiene are normal.  Body habitus is endomorphic.  Cardiovascular exam observation and palpation revealed warm extremities normal pulses but mild to moderate peripheral edema.  Lymph nodes exam negative  Skin exam normal x4 extremities  Neurologically intact no deficits or psychiatric exam awake alert oriented x3 no signs of depression  Amylase with a cane  Upper extremity shows some swelling of the joints especially the RIGHT upper extremity along the RIGHT wrist on the ulnar side.  This area is somewhat tender.  Otherwise there are no contractures subluxations no atrophy and no tremors  RIGHT knee there is a pocket of fluid in the back of the knee consistent with Baker cyst.  Medial joint line tenderness is noted.  No significant joint effusion noted.  Range of motion equals 110.  Morphology of the limb may prevent further knee flexion he appears to come to full extension.  Strength and extension is normal.  Knee is stable in the coronal as well as the sagittal plane  The LEFT knee  cursory exam revealed no contracture subluxation atrophy tremor or malalignment  Of note the RIGHT knee is in varus alignment  Hip range of motion normal bilaterally.  Imaging  The patient came with an MRI it shows severe arthritis extrusion of the medial meniscus and large baker cyst  I took plain films with the patient standing or graft to show severe varus malalignment.  The medial compartment is completely obliterated.  There are marginal osteophytes.  Impression although the patient has a diagnosis of rheumatoid arthritis and this may be osteoarthritis or at least the x-ray findings are osteoarthritis  with marginal osteophytes varus deformity and asymmetric medial compartment disease  At this point I think the patient will need a knee replacement.  I think an x-ray of the cervical spine is warranted if surgery is planned.  I will speak with Dr. Kellie Simmering look to discuss possible surgery in [facility] to manage the rheumatoid arthritis  If that is the case she is familiar with Dr. Ophelia Charter and like to see him for evaluation for knee replacement.

## 2012-10-28 ENCOUNTER — Emergency Department (HOSPITAL_COMMUNITY): Payer: Worker's Compensation

## 2012-10-28 ENCOUNTER — Emergency Department (HOSPITAL_COMMUNITY)
Admission: EM | Admit: 2012-10-28 | Discharge: 2012-10-28 | Disposition: A | Discharge status: Home / Self Care | Payer: Worker's Compensation | Attending: Emergency Medicine | Admitting: Emergency Medicine

## 2012-10-28 ENCOUNTER — Encounter (HOSPITAL_COMMUNITY): Payer: Self-pay | Admitting: *Deleted

## 2012-10-28 ENCOUNTER — Other Ambulatory Visit: Payer: Self-pay

## 2012-10-28 DIAGNOSIS — Z79899 Other long term (current) drug therapy: Secondary | ICD-10-CM | POA: Insufficient documentation

## 2012-10-28 DIAGNOSIS — R197 Diarrhea, unspecified: Secondary | ICD-10-CM | POA: Insufficient documentation

## 2012-10-28 DIAGNOSIS — Y9289 Other specified places as the place of occurrence of the external cause: Secondary | ICD-10-CM | POA: Insufficient documentation

## 2012-10-28 DIAGNOSIS — R55 Syncope and collapse: Secondary | ICD-10-CM | POA: Insufficient documentation

## 2012-10-28 DIAGNOSIS — R296 Repeated falls: Secondary | ICD-10-CM | POA: Insufficient documentation

## 2012-10-28 DIAGNOSIS — E079 Disorder of thyroid, unspecified: Secondary | ICD-10-CM | POA: Insufficient documentation

## 2012-10-28 DIAGNOSIS — Y9389 Activity, other specified: Secondary | ICD-10-CM | POA: Insufficient documentation

## 2012-10-28 DIAGNOSIS — R42 Dizziness and giddiness: Secondary | ICD-10-CM | POA: Insufficient documentation

## 2012-10-28 DIAGNOSIS — R110 Nausea: Secondary | ICD-10-CM | POA: Insufficient documentation

## 2012-10-28 DIAGNOSIS — S161XXA Strain of muscle, fascia and tendon at neck level, initial encounter: Secondary | ICD-10-CM

## 2012-10-28 DIAGNOSIS — S139XXA Sprain of joints and ligaments of unspecified parts of neck, initial encounter: Secondary | ICD-10-CM | POA: Insufficient documentation

## 2012-10-28 LAB — COMPREHENSIVE METABOLIC PANEL
ALT: 19 U/L (ref 0–35)
AST: 37 U/L (ref 0–37)
Albumin: 4.2 g/dL (ref 3.5–5.2)
Alkaline Phosphatase: 119 U/L — ABNORMAL HIGH (ref 39–117)
BUN: 21 mg/dL (ref 6–23)
CO2: 28 mEq/L (ref 19–32)
Calcium: 9.8 mg/dL (ref 8.4–10.5)
Chloride: 94 mEq/L — ABNORMAL LOW (ref 96–112)
Creatinine, Ser: 0.82 mg/dL (ref 0.50–1.10)
GFR calc Af Amer: 88 mL/min — ABNORMAL LOW (ref >90)
GFR calc non Af Amer: 76 mL/min — ABNORMAL LOW (ref >90)
Sodium: 134 mEq/L — ABNORMAL LOW (ref 135–145)
Total Bilirubin: 0.3 mg/dL (ref 0.3–1.2)

## 2012-10-28 LAB — CBC WITH DIFFERENTIAL/PLATELET
Basophils Absolute: 0 10*3/uL (ref 0.0–0.1)
Eosinophils Relative: 0 % (ref 0–5)
HCT: 41.3 % (ref 36.0–46.0)
Hemoglobin: 13.5 g/dL (ref 12.0–15.0)
Lymphocytes Relative: 11 % — ABNORMAL LOW (ref 12–46)
MCH: 26.7 pg (ref 26.0–34.0)
MCHC: 32.7 g/dL (ref 30.0–36.0)
MCV: 81.6 fL (ref 78.0–100.0)
Monocytes Absolute: 1.1 10*3/uL — ABNORMAL HIGH (ref 0.1–1.0)
Monocytes Relative: 6 % (ref 3–12)
Neutro Abs: 15.6 10*3/uL — ABNORMAL HIGH (ref 1.7–7.7)
Neutrophils Relative %: 83 % — ABNORMAL HIGH (ref 43–77)
Platelets: 367 10*3/uL (ref 150–400)
RBC: 5.06 MIL/uL (ref 3.87–5.11)
RDW: 15.1 % (ref 11.5–15.5)
WBC: 18.9 10*3/uL — ABNORMAL HIGH (ref 4.0–10.5)

## 2012-10-28 LAB — GLUCOSE, CAPILLARY: Glucose-Capillary: 128 mg/dL — ABNORMAL HIGH (ref 70–99)

## 2012-10-28 NOTE — ED Notes (Signed)
C-Collar placed on arrival.

## 2012-10-28 NOTE — ED Notes (Signed)
Pt states syncopal episode today at work at [employer] Pt states she fell between wall and toilet, c/o neck pain. States she went home and was called by work to come in to be checked.

## 2012-10-28 NOTE — ED Notes (Signed)
Patient with no complaints at this time. Respirations even and unlabored. Skin warm/dry. Discharge instructions reviewed with patient at this time. Patient given opportunity to voice concerns/ask questions. Patient discharged at this time and left Emergency Department with steady gait.   

## 2012-10-28 NOTE — ED Provider Notes (Signed)
History  This chart was scribed for Geoffery Lyons, MD by Bennett Scrape, ED Scribe. This patient was seen in room APA19/[facility]19 and the patient's care was started at 3:20 PM.  CSN: [id number]  Arrival date & time 10/28/12  1441   First MD Initiated Contact with Patient 10/28/12 1520      Chief Complaint  Patient presents with  . Loss of Consciousness  . Neck Pain    The history is provided by the patient. No language interpreter was used.   Laura Allen is a 60 y.o. female who presents to the Emergency Department complaining of one syncopal episode that occurred in the bathroom at her work approximately 4 hours ago. She states that she sat down, immediately felt lightheaded and nauseated and woke up with her head bent to the left against the wall next to the toilet. She c/o neck pain described as stiffness that she states has been improving since the incident. She states that she went home, took a nap and was told by her work to be evaluated when she called to check back in. She reports prior episodes of syncope while pregnant and during times of emotional distress, the last episode occurring 5 years ago. She reports one episode of non-bloody diarrhea today after the incident but denies emesis, CP, and abdominal pain as associated symptoms. She denies any recent changes in medication or heart related conditions. She denies having recurrent angina, following up with a cardiologist or having a recent stress test performed. She has a h/o hypothyroidism and she denies smoking and alcohol use.   Past Medical History  Diagnosis Date  . History of underactive thyroid   . Rheumatoid arthritis     Past Surgical History  Procedure Date  . Cholecystectomy   . Ovarian cyst surgery     Family History  Problem Relation Age of Onset  . Arthritis    . Cancer    . Diabetes      History  Substance Use Topics  . Smoking status: Never Smoker   . Smokeless tobacco: Not on file  . Alcohol  Use: No    No OB history provided.  Review of Systems  Constitutional: Negative for fever and chills.  HENT: Positive for neck pain. Negative for congestion.   Cardiovascular: Negative for chest pain.  Gastrointestinal: Positive for nausea (prior to the episode) and diarrhea (after the episode). Negative for vomiting and abdominal pain.  Neurological: Positive for syncope (one episode) and light-headedness (prior to the episode). Negative for headaches.  All other systems reviewed and are negative.    Allergies  Levofloxacin and Streptomycin  Home Medications   Current Outpatient Rx  Name  Route  Sig  Dispense  Refill  . DILTIAZEM HCL 120 MG PO TABS   Oral   Take 120 mg by mouth 4 (four) times daily.         . ENBREL [place]   Subcutaneous   Inject into the skin.         . FUROSEMIDE 20 MG PO TABS   Oral   Take 20 mg by mouth 2 (two) times daily.         Marland Kitchen LEVOTHYROXINE SODIUM 75 MCG PO TABS   Oral   Take 75 mcg by mouth daily.         Marland Kitchen PREDNISONE 10 MG PO TABS   Oral   Take 10 mg by mouth daily.           Triage  Vitals: BP 119/73  Pulse 92  Temp 97.5 F (36.4 C) (Oral)  Resp 16  Ht 5\' 7"  (1.702 m)  Wt 225 lb (102.059 kg)  BMI 35.24 kg/m2  SpO2 100%  Physical Exam  Nursing note and vitals reviewed. Constitutional: She is oriented to person, place, and time. She appears well-developed and well-nourished. No distress.  HENT:  Head: Normocephalic and atraumatic.  Eyes: Conjunctivae normal and EOM are normal. Pupils are equal, round, and reactive to light.  Neck: No tracheal deviation present.  Cardiovascular: Normal rate and regular rhythm.   Pulmonary/Chest: Effort normal and breath sounds normal. No respiratory distress.  Musculoskeletal: Normal range of motion. She exhibits tenderness. She exhibits no edema.       Tenderness to palpation in the soft tissues of the lower cervical spine. There is no bony tenderness or step offs.  Neurological: She  is alert and oriented to person, place, and time. She exhibits normal muscle tone. Coordination normal.  Skin: Skin is warm and dry.  Psychiatric: She has a normal mood and affect. Her behavior is normal.    ED Course  Procedures (including critical care time)  DIAGNOSTIC STUDIES: Oxygen Saturation is 100% on room air, normal by my interpretation.    COORDINATION OF CARE: 3:39 PM- Advised pt that her EKG was normal. Discussed treatment plan which includes x-rays and CBC panel with pt at bedside and pt agreed to plan.  Labs Reviewed  GLUCOSE, CAPILLARY - Abnormal; Notable for the following:    Glucose-Capillary 128 (*)     All other components within normal limits  CBC WITH DIFFERENTIAL - Abnormal; Notable for the following:    WBC 18.9 (*)     Neutrophils Relative 83 (*)     Neutro Abs 15.6 (*)     Lymphocytes Relative 11 (*)     Monocytes Absolute 1.1 (*)     All other components within normal limits  COMPREHENSIVE METABOLIC PANEL - Abnormal; Notable for the following:    Sodium 134 (*)     Potassium 3.2 (*)     Chloride 94 (*)     Glucose, Bld 115 (*)     Total Protein 8.4 (*)     Alkaline Phosphatase 119 (*)     GFR calc non Af Amer 76 (*)     GFR calc Af Amer 88 (*)     All other components within normal limits   Dg Cervical Spine Complete  10/28/2012  *RADIOLOGY REPORT*  Clinical Data: Neck pain  CERVICAL SPINE - COMPLETE 4+ VIEW  Comparison: None  Findings: Mild osseous demineralization. Disc space narrowing with endplate spur formation C4-C5, C5-C6, and C6-C7 with minimal retrolistheses at these levels. Diffuse facet degenerative changes. Prevertebral soft tissues normal thickness. Small uncovertebral spurs encroach upon cervical neural foramina bilaterally, greatest at left C6-C7. No acute fracture, additional subluxation, or bone destruction. C1-C2 alignment normal.  IMPRESSION: Multilevel facet degenerative changes. Degenerative disc disease changes C4-C5 through C6-C7  with minimal retrolistheses and scattered neural foraminal encroachment greatest at left C6-C7 neural foramen.   Original Report Authenticated By: Ulyses Southward, M.D.      No diagnosis found.   Date: 10/28/2012  Rate: 90  Rhythm: normal sinus rhythm  QRS Axis: normal  Intervals: normal  ST/T Wave abnormalities: nonspecific T wave changes  Conduction Disutrbances:none  Narrative Interpretation:   Old EKG Reviewed: none available    MDM  The patient presents after a syncopal episode at work.  She is complaining  of neck discomfort, however the xrays are unremarkable for fracture.  Her labs reveal an elevated wbc of 19k but she has no other symptoms to explain this.  The electrolytes are unremarkable as is the ekg.  She will be discharged to home with prn follow up.        I personally performed the services described in this documentation, which was scribed in my presence. The recorded information has been reviewed and is accurate.      Geoffery Lyons, MD 10/28/12 [phone]

## 2013-08-18 ENCOUNTER — Encounter (HOSPITAL_COMMUNITY): Payer: Self-pay | Admitting: Pharmacy Technician

## 2013-08-30 NOTE — Patient Instructions (Signed)
Your procedure is scheduled on: 09/04/2013  Report to [facility] at  830  AM.  Call this number if you have problems the morning of surgery: [phone]   Do not eat food or drink liquids :After Midnight.      Take these medicines the morning of surgery with A SIP OF WATER:cardiazem, hydrodiuril, synthroid, methotrexate, deltazone   Do not wear jewelry, make-up or nail polish.  Do not wear lotions, powders, or perfumes.   Do not shave 48 hours prior to surgery.  Do not bring valuables to the hospital.  Contacts, dentures or bridgework may not be worn into surgery.  Leave suitcase in the car. After surgery it may be brought to your room.  For patients admitted to the hospital, checkout time is 11:00 AM the day of discharge.   Patients discharged the day of surgery will not be allowed to drive home.  :     Please read over the following fact sheets that you were given: Coughing and Deep Breathing, Surgical Site Infection Prevention, Anesthesia Post-op Instructions and Care and Recovery After Surgery    Cataract A cataract is a clouding of the lens of the eye. When a lens becomes cloudy, vision is reduced based on the degree and nature of the clouding. Many cataracts reduce vision to some degree. Some cataracts make people more near-sighted as they develop. Other cataracts increase glare. Cataracts that are ignored and become worse can sometimes look white. The white color can be seen through the pupil. CAUSES   Aging. However, cataracts may occur at any age, even in newborns.   Certain drugs.   Trauma to the eye.   Certain diseases such as diabetes.   Specific eye diseases such as chronic inflammation inside the eye or a sudden attack of a rare form of glaucoma.   Inherited or acquired medical problems.  SYMPTOMS   Gradual, progressive drop in vision in the affected eye.   Severe, rapid visual loss. This most often happens when trauma is the cause.  DIAGNOSIS  To detect a  cataract, an eye doctor examines the lens. Cataracts are best diagnosed with an exam of the eyes with the pupils enlarged (dilated) by drops.  TREATMENT  For an early cataract, vision may improve by using different eyeglasses or stronger lighting. If that does not help your vision, surgery is the only effective treatment. A cataract needs to be surgically removed when vision loss interferes with your everyday activities, such as driving, reading, or watching TV. A cataract may also have to be removed if it prevents examination or treatment of another eye problem. Surgery removes the cloudy lens and usually replaces it with a substitute lens (intraocular lens, IOL).  At a time when both you and your doctor agree, the cataract will be surgically removed. If you have cataracts in both eyes, only one is usually removed at a time. This allows the operated eye to heal and be out of danger from any possible problems after surgery (such as infection or poor wound healing). In rare cases, a cataract may be doing damage to your eye. In these cases, your caregiver may advise surgical removal right away. The vast majority of people who have cataract surgery have better vision afterward. HOME CARE INSTRUCTIONS  If you are not planning surgery, you may be asked to do the following:  Use different eyeglasses.   Use stronger or brighter lighting.   Ask your eye doctor about reducing your medicine dose or  changing medicines if it is thought that a medicine caused your cataract. Changing medicines does not make the cataract go away on its own.   Become familiar with your surroundings. Poor vision can lead to injury. Avoid bumping into things on the affected side. You are at a higher risk for tripping or falling.   Exercise extreme care when driving or operating machinery.   Wear sunglasses if you are sensitive to bright light or experiencing problems with glare.  SEEK IMMEDIATE MEDICAL CARE IF:   You have a  worsening or sudden vision loss.   You notice redness, swelling, or increasing pain in the eye.   You have a fever.  Document Released: 10/26/2005 Document Revised: 10/15/2011 Document Reviewed: 06/19/2011 [facility] Patient Information 2012 [place], [place].PATIENT INSTRUCTIONS POST-ANESTHESIA  IMMEDIATELY FOLLOWING SURGERY:  Do not drive or operate machinery for the first twenty four hours after surgery.  Do not make any important decisions for twenty four hours after surgery or while taking narcotic pain medications or sedatives.  If you develop intractable nausea and vomiting or a severe headache please notify your doctor immediately.  FOLLOW-UP:  Please make an appointment with your surgeon as instructed. You do not need to follow up with anesthesia unless specifically instructed to do so.  WOUND CARE INSTRUCTIONS (if applicable):  Keep a dry clean dressing on the anesthesia/puncture wound site if there is drainage.  Once the wound has quit draining you may leave it open to air.  Generally you should leave the bandage intact for twenty four hours unless there is drainage.  If the epidural site drains for more than 36-48 hours please call the anesthesia department.  QUESTIONS?:  Please feel free to call your physician or the hospital operator if you have any questions, and they will be happy to assist you.

## 2013-08-31 ENCOUNTER — Encounter (HOSPITAL_COMMUNITY)
Admission: RE | Admit: 2013-08-31 | Discharge: 2013-08-31 | Disposition: A | Discharge status: Home / Self Care | Payer: BC Managed Care – PPO | Source: Ambulatory Visit | Attending: Ophthalmology | Admitting: Ophthalmology

## 2013-08-31 ENCOUNTER — Encounter (HOSPITAL_COMMUNITY): Payer: Self-pay | Admitting: Pharmacy Technician

## 2013-09-01 ENCOUNTER — Encounter (HOSPITAL_COMMUNITY)
Admission: RE | Admit: 2013-09-01 | Discharge: 2013-09-01 | Disposition: A | Discharge status: Home / Self Care | Payer: BC Managed Care – PPO | Source: Ambulatory Visit | Attending: Ophthalmology | Admitting: Ophthalmology

## 2013-09-01 ENCOUNTER — Encounter (HOSPITAL_COMMUNITY): Payer: Self-pay

## 2013-09-01 HISTORY — DX: Hypothyroidism, unspecified: E03.9

## 2013-09-01 HISTORY — DX: Essential (primary) hypertension: I10

## 2013-09-01 LAB — BASIC METABOLIC PANEL
CO2: 28 mEq/L (ref 19–32)
Chloride: 95 mEq/L — ABNORMAL LOW (ref 96–112)
Creatinine, Ser: 1.02 mg/dL (ref 0.50–1.10)
GFR calc Af Amer: 67 mL/min — ABNORMAL LOW (ref >90)
Potassium: 3.7 mEq/L (ref 3.5–5.1)

## 2013-09-01 LAB — HEMOGLOBIN AND HEMATOCRIT, BLOOD
HCT: 41.2 % (ref 36.0–46.0)
Hemoglobin: 13.6 g/dL (ref 12.0–15.0)

## 2013-09-01 MED ORDER — PHENYLEPHRINE HCL 2.5 % OP SOLN
OPHTHALMIC | Status: AC
  Filled 2013-09-01: qty 15

## 2013-09-01 MED ORDER — TETRACAINE HCL 0.5 % OP SOLN
OPHTHALMIC | Status: AC
  Filled 2013-09-01: qty 2

## 2013-09-01 MED ORDER — LIDOCAINE HCL 3.5 % OP GEL
OPHTHALMIC | Status: AC
  Filled 2013-09-01: qty 1

## 2013-09-01 MED ORDER — LIDOCAINE HCL (PF) 1 % IJ SOLN
INTRAMUSCULAR | Status: AC
  Filled 2013-09-01: qty 2

## 2013-09-01 MED ORDER — CYCLOPENTOLATE-PHENYLEPHRINE OP SOLN OPTIME - NO CHARGE
OPHTHALMIC | Status: AC
  Filled 2013-09-01: qty 2

## 2013-09-01 MED ORDER — NEOMYCIN-POLYMYXIN-DEXAMETH 3.5-10000-0.1 OP SUSP
OPHTHALMIC | Status: AC
  Filled 2013-09-01: qty 5

## 2013-09-04 ENCOUNTER — Encounter (HOSPITAL_COMMUNITY): Payer: BC Managed Care – PPO | Admitting: Anesthesiology

## 2013-09-04 ENCOUNTER — Ambulatory Visit (HOSPITAL_COMMUNITY)
Admission: RE | Admit: 2013-09-04 | Discharge: 2013-09-04 | Disposition: A | Discharge status: Home / Self Care | Payer: BC Managed Care – PPO | Source: Ambulatory Visit | Attending: Ophthalmology | Admitting: Ophthalmology

## 2013-09-04 ENCOUNTER — Ambulatory Visit (HOSPITAL_COMMUNITY): Payer: BC Managed Care – PPO | Admitting: Anesthesiology

## 2013-09-04 ENCOUNTER — Encounter (HOSPITAL_COMMUNITY)
Admission: RE | Disposition: A | Discharge status: Home / Self Care | Payer: Self-pay | Source: Ambulatory Visit | Attending: Ophthalmology

## 2013-09-04 ENCOUNTER — Encounter (HOSPITAL_COMMUNITY): Payer: Self-pay | Admitting: *Deleted

## 2013-09-04 DIAGNOSIS — Z01812 Encounter for preprocedural laboratory examination: Secondary | ICD-10-CM | POA: Insufficient documentation

## 2013-09-04 DIAGNOSIS — H2589 Other age-related cataract: Secondary | ICD-10-CM | POA: Insufficient documentation

## 2013-09-04 DIAGNOSIS — I10 Essential (primary) hypertension: Secondary | ICD-10-CM | POA: Insufficient documentation

## 2013-09-04 HISTORY — PX: CATARACT EXTRACTION W/PHACO: SHX586

## 2013-09-04 SURGERY — PHACOEMULSIFICATION, CATARACT, WITH IOL INSERTION
Anesthesia: Monitor Anesthesia Care | Site: Eye | Laterality: Left | Wound class: Clean

## 2013-09-04 MED ORDER — FENTANYL CITRATE 0.05 MG/ML IJ SOLN
25.0000 ug | INTRAMUSCULAR | Status: AC
  Administered 2013-09-04 (×2): 25 ug via INTRAVENOUS

## 2013-09-04 MED ORDER — PHENYLEPHRINE HCL 2.5 % OP SOLN
1.0000 [drp] | OPHTHALMIC | Status: AC
  Administered 2013-09-04 (×3): 1 [drp] via OPHTHALMIC

## 2013-09-04 MED ORDER — PROVISC 10 MG/ML IO SOLN
INTRAOCULAR | Status: DC | PRN
  Administered 2013-09-04: 8.5 mg via INTRAOCULAR

## 2013-09-04 MED ORDER — BSS IO SOLN
INTRAOCULAR | Status: DC | PRN
  Administered 2013-09-04: 15 mL via INTRAOCULAR

## 2013-09-04 MED ORDER — EPINEPHRINE HCL 1 MG/ML IJ SOLN
INTRAMUSCULAR | Status: DC | PRN
  Administered 2013-09-04: 10:00:00

## 2013-09-04 MED ORDER — LACTATED RINGERS IV SOLN
INTRAVENOUS | Status: DC
  Administered 2013-09-04: 09:00:00 via INTRAVENOUS

## 2013-09-04 MED ORDER — POVIDONE-IODINE 5 % OP SOLN
OPHTHALMIC | Status: DC | PRN
  Administered 2013-09-04: 1 via OPHTHALMIC

## 2013-09-04 MED ORDER — TETRACAINE HCL 0.5 % OP SOLN
1.0000 [drp] | OPHTHALMIC | Status: AC
  Administered 2013-09-04 (×3): 1 [drp] via OPHTHALMIC

## 2013-09-04 MED ORDER — FENTANYL CITRATE 0.05 MG/ML IJ SOLN
INTRAMUSCULAR | Status: AC
  Filled 2013-09-04: qty 2

## 2013-09-04 MED ORDER — CYCLOPENTOLATE-PHENYLEPHRINE 0.2-1 % OP SOLN
1.0000 [drp] | OPHTHALMIC | Status: AC
  Administered 2013-09-04 (×3): 1 [drp] via OPHTHALMIC

## 2013-09-04 MED ORDER — MIDAZOLAM HCL 2 MG/2ML IJ SOLN
1.0000 mg | INTRAMUSCULAR | Status: DC | PRN
  Administered 2013-09-04: 2 mg via INTRAVENOUS

## 2013-09-04 MED ORDER — EPINEPHRINE HCL 1 MG/ML IJ SOLN
INTRAMUSCULAR | Status: AC
  Filled 2013-09-04: qty 1

## 2013-09-04 MED ORDER — LIDOCAINE HCL 3.5 % OP GEL
1.0000 "application " | Freq: Once | OPHTHALMIC | Status: AC
  Administered 2013-09-04: 1 via OPHTHALMIC

## 2013-09-04 MED ORDER — LIDOCAINE HCL (PF) 1 % IJ SOLN
INTRAMUSCULAR | Status: DC | PRN
  Administered 2013-09-04: .6 mL

## 2013-09-04 MED ORDER — MIDAZOLAM HCL 2 MG/2ML IJ SOLN
INTRAMUSCULAR | Status: AC
  Filled 2013-09-04: qty 2

## 2013-09-04 MED ORDER — LIDOCAINE 3.5 % OP GEL OPTIME - NO CHARGE
OPHTHALMIC | Status: DC | PRN
  Administered 2013-09-04: 2 [drp] via OPHTHALMIC

## 2013-09-04 SURGICAL SUPPLY — 32 items

## 2013-09-04 NOTE — H&P (Signed)
I have reviewed the H&P, the patient was re-examined, and I have identified no interval changes in medical condition and plan of care since the history and physical of record  

## 2013-09-04 NOTE — Op Note (Signed)
Date of Admission: 09/04/2013  Date of Surgery: 09/04/2013  Pre-Op Dx: Cataract  Left  Eye  Post-Op Dx: Combined Cataract  Left  Eye,  Dx Code 366.19  Surgeon: Gemma Payor, M.D.  Assistants: None  Anesthesia: Topical with MAC  Indications: Painless, progressive loss of vision with compromise of daily activities.  Surgery: Cataract Extraction with Intraocular lens Implant Left Eye  Discription: The patient had dilating drops and viscous lidocaine placed into the left eye in the pre-op holding area. After transfer to the operating room, a time out was performed. The patient was then prepped and draped. Beginning with a 75 degree blade a paracentesis port was made at the surgeon's 2 o'clock position. The anterior chamber was then filled with 1% non-preserved lidocaine. This was followed by filling the anterior chamber with Provisc. A 2.2mm keratome blade was used to make a clear corneal incision at the temporal limbus. A bent cystatome needle was used to create a continuous tear capsulotomy. Hydrodissection was performed with balanced salt solution on a Fine canula. The lens nucleus was then removed using the phacoemulsification handpiece. Residual cortex was removed with the I&A handpiece. The anterior chamber and capsular bag were refilled with Provisc. A posterior chamber intraocular lens was placed into the capsular bag with it's injector. The implant was positioned with the Kuglan hook. The Provisc was then removed from the anterior chamber and capsular bag with the I&A handpiece. Stromal hydration of the main incision and paracentesis port was performed with BSS on a Fine canula. The wounds were tested for leak which was negative. The patient tolerated the procedure well. There were no operative complications. The patient was then transferred to the recovery room in stable condition.  Complications: None  Specimen: None  EBL: None  Prosthetic device: B&L enVista, MX60, power 17.5D, SN  [id number].

## 2013-09-04 NOTE — Anesthesia Preprocedure Evaluation (Signed)
Anesthesia Evaluation  Patient identified by MRN, date of birth, ID band Patient awake    Reviewed: Allergy & Precautions, H&P , NPO status , Patient's Chart, lab work & pertinent test results  Airway Mallampati: II      Dental  (+) Teeth Intact   Pulmonary neg pulmonary ROS,  breath sounds clear to auscultation        Cardiovascular hypertension, Pt. on medications Rhythm:Regular Rate:Normal     Neuro/Psych    GI/Hepatic   Endo/Other  Hypothyroidism   Renal/GU      Musculoskeletal  (+) Arthritis -, Rheumatoid disorders,    Abdominal   Peds  Hematology   Anesthesia Other Findings   Reproductive/Obstetrics                           Anesthesia Physical Anesthesia Plan  ASA: III  Anesthesia Plan: MAC   Post-op Pain Management:    Induction: Intravenous  Airway Management Planned: Nasal Cannula  Additional Equipment:   Intra-op Plan:   Post-operative Plan:   Informed Consent: I have reviewed the patients History and Physical, chart, labs and discussed the procedure including the risks, benefits and alternatives for the proposed anesthesia with the patient or authorized representative who has indicated his/her understanding and acceptance.     Plan Discussed with:   Anesthesia Plan Comments:         Anesthesia Quick Evaluation  

## 2013-09-04 NOTE — Transfer of Care (Signed)
Immediate Anesthesia Transfer of Care Note  Patient: Laura Allen  Procedure(s) Performed: Procedure(s) with comments: CATARACT EXTRACTION PHACO AND INTRAOCULAR LENS PLACEMENT (IOC) (Left) - CDE:  10.22  Patient Location: Short Stay  Anesthesia Type:MAC  Level of Consciousness: awake, alert , oriented and patient cooperative  Airway & Oxygen Therapy: Patient Spontanous Breathing  Post-op Assessment: Report given to PACU RN, Post -op Vital signs reviewed and stable and Patient moving all extremities  Post vital signs: Reviewed and stable  Complications: No apparent anesthesia complications

## 2013-09-04 NOTE — Anesthesia Postprocedure Evaluation (Signed)
  Anesthesia Post-op Note  Patient: Laura Allen  Procedure(s) Performed: Procedure(s) with comments: CATARACT EXTRACTION PHACO AND INTRAOCULAR LENS PLACEMENT (IOC) (Left) - CDE:  10.22  Patient Location: Short Stay  Anesthesia Type:MAC  Level of Consciousness: awake, alert , oriented and patient cooperative  Airway and Oxygen Therapy: Patient Spontanous Breathing  Post-op Pain: none  Post-op Assessment: Post-op Vital signs reviewed, Patient's Cardiovascular Status Stable, Respiratory Function Stable, Patent Airway and Pain level controlled  Post-op Vital Signs: Reviewed and stable  Complications: No apparent anesthesia complications

## 2013-09-06 ENCOUNTER — Encounter (HOSPITAL_COMMUNITY): Payer: Self-pay | Admitting: Ophthalmology

## 2013-09-11 ENCOUNTER — Encounter (HOSPITAL_COMMUNITY): Payer: Self-pay | Admitting: Pharmacy Technician

## 2013-09-12 ENCOUNTER — Encounter (HOSPITAL_COMMUNITY)
Admission: RE | Admit: 2013-09-12 | Discharge: 2013-09-12 | Disposition: A | Discharge status: Home / Self Care | Payer: BC Managed Care – PPO | Source: Ambulatory Visit | Attending: Ophthalmology | Admitting: Ophthalmology

## 2013-09-12 ENCOUNTER — Encounter (HOSPITAL_COMMUNITY): Payer: Self-pay

## 2013-09-13 MED ORDER — NEOMYCIN-POLYMYXIN-DEXAMETH 3.5-10000-0.1 OP SUSP
OPHTHALMIC | Status: AC
  Filled 2013-09-13: qty 5

## 2013-09-13 MED ORDER — PHENYLEPHRINE HCL 2.5 % OP SOLN
OPHTHALMIC | Status: AC
  Filled 2013-09-13: qty 15

## 2013-09-13 MED ORDER — LIDOCAINE HCL (PF) 1 % IJ SOLN
INTRAMUSCULAR | Status: AC
  Filled 2013-09-13: qty 2

## 2013-09-13 MED ORDER — TETRACAINE HCL 0.5 % OP SOLN
OPHTHALMIC | Status: AC
  Filled 2013-09-13: qty 2

## 2013-09-14 ENCOUNTER — Ambulatory Visit (HOSPITAL_COMMUNITY)
Admission: RE | Admit: 2013-09-14 | Discharge: 2013-09-14 | Disposition: A | Discharge status: Home / Self Care | Payer: BC Managed Care – PPO | Source: Ambulatory Visit | Attending: Ophthalmology | Admitting: Ophthalmology

## 2013-09-14 ENCOUNTER — Encounter (HOSPITAL_COMMUNITY): Payer: Self-pay | Admitting: Ophthalmology

## 2013-09-14 ENCOUNTER — Ambulatory Visit (HOSPITAL_COMMUNITY): Payer: BC Managed Care – PPO | Admitting: Anesthesiology

## 2013-09-14 ENCOUNTER — Encounter (HOSPITAL_COMMUNITY): Payer: BC Managed Care – PPO | Admitting: Anesthesiology

## 2013-09-14 ENCOUNTER — Encounter (HOSPITAL_COMMUNITY)
Admission: RE | Disposition: A | Discharge status: Home / Self Care | Payer: Self-pay | Source: Ambulatory Visit | Attending: Ophthalmology

## 2013-09-14 DIAGNOSIS — H2589 Other age-related cataract: Secondary | ICD-10-CM | POA: Insufficient documentation

## 2013-09-14 DIAGNOSIS — I10 Essential (primary) hypertension: Secondary | ICD-10-CM | POA: Insufficient documentation

## 2013-09-14 HISTORY — PX: CATARACT EXTRACTION W/PHACO: SHX586

## 2013-09-14 SURGERY — PHACOEMULSIFICATION, CATARACT, WITH IOL INSERTION
Anesthesia: Monitor Anesthesia Care | Site: Eye | Laterality: Right | Wound class: Clean

## 2013-09-14 MED ORDER — LIDOCAINE 3.5 % OP GEL OPTIME - NO CHARGE
OPHTHALMIC | Status: DC | PRN
  Administered 2013-09-14: 2 [drp] via OPHTHALMIC

## 2013-09-14 MED ORDER — LACTATED RINGERS IV SOLN
INTRAVENOUS | Status: DC
  Administered 2013-09-14: 1000 mL via INTRAVENOUS

## 2013-09-14 MED ORDER — POVIDONE-IODINE 5 % OP SOLN
OPHTHALMIC | Status: DC | PRN
  Administered 2013-09-14: 1 via OPHTHALMIC

## 2013-09-14 MED ORDER — CYCLOPENTOLATE-PHENYLEPHRINE 0.2-1 % OP SOLN
1.0000 [drp] | OPHTHALMIC | Status: AC
  Administered 2013-09-14 (×3): 1 [drp] via OPHTHALMIC

## 2013-09-14 MED ORDER — FENTANYL CITRATE 0.05 MG/ML IJ SOLN
25.0000 ug | INTRAMUSCULAR | Status: AC
  Administered 2013-09-14 (×2): 25 ug via INTRAVENOUS

## 2013-09-14 MED ORDER — MIDAZOLAM HCL 5 MG/5ML IJ SOLN
INTRAMUSCULAR | Status: AC
  Filled 2013-09-14: qty 5

## 2013-09-14 MED ORDER — MIDAZOLAM HCL 2 MG/2ML IJ SOLN
1.0000 mg | INTRAMUSCULAR | Status: DC | PRN
  Administered 2013-09-14: 2 mg via INTRAVENOUS

## 2013-09-14 MED ORDER — PHENYLEPHRINE HCL 2.5 % OP SOLN
1.0000 [drp] | OPHTHALMIC | Status: AC
  Administered 2013-09-14 (×3): 1 [drp] via OPHTHALMIC

## 2013-09-14 MED ORDER — FENTANYL CITRATE 0.05 MG/ML IJ SOLN
INTRAMUSCULAR | Status: AC
  Filled 2013-09-14: qty 2

## 2013-09-14 MED ORDER — LIDOCAINE HCL 3.5 % OP GEL
1.0000 "application " | Freq: Once | OPHTHALMIC | Status: AC
  Administered 2013-09-14: 1 via OPHTHALMIC

## 2013-09-14 MED ORDER — BSS IO SOLN
INTRAOCULAR | Status: DC | PRN
  Administered 2013-09-14: 15 mL via INTRAOCULAR

## 2013-09-14 MED ORDER — TETRACAINE HCL 0.5 % OP SOLN
1.0000 [drp] | OPHTHALMIC | Status: AC
  Administered 2013-09-14 (×3): 1 [drp] via OPHTHALMIC

## 2013-09-14 MED ORDER — LIDOCAINE HCL 3.5 % OP GEL
OPHTHALMIC | Status: AC
  Filled 2013-09-14: qty 1

## 2013-09-14 MED ORDER — LIDOCAINE HCL (PF) 1 % IJ SOLN
INTRAMUSCULAR | Status: DC | PRN
  Administered 2013-09-14: .3 mL

## 2013-09-14 MED ORDER — EPINEPHRINE HCL 1 MG/ML IJ SOLN
INTRAOCULAR | Status: DC | PRN
  Administered 2013-09-14: 15:00:00

## 2013-09-14 MED ORDER — PROVISC 10 MG/ML IO SOLN
INTRAOCULAR | Status: DC | PRN
  Administered 2013-09-14: 0.85 mL via INTRAOCULAR

## 2013-09-14 SURGICAL SUPPLY — 32 items

## 2013-09-14 NOTE — Anesthesia Postprocedure Evaluation (Signed)
  Anesthesia Post-op Note  Patient: Laura Allen  Procedure(s) Performed: Procedure(s) with comments: CATARACT EXTRACTION PHACO AND INTRAOCULAR LENS PLACEMENT (IOC) (Right) - CDE:8.81  Patient Location: Short Stay  Anesthesia Type:MAC  Level of Consciousness: awake, alert  and oriented  Airway and Oxygen Therapy: Patient Spontanous Breathing  Post-op Pain: none  Post-op Assessment: Post-op Vital signs reviewed, Patient's Cardiovascular Status Stable, Respiratory Function Stable, Patent Airway and No signs of Nausea or vomiting  Post-op Vital Signs: Reviewed and stable  Complications: No apparent anesthesia complications

## 2013-09-14 NOTE — Anesthesia Preprocedure Evaluation (Signed)
Anesthesia Evaluation  Patient identified by MRN, date of birth, ID band Patient awake    Reviewed: Allergy & Precautions, H&P , NPO status , Patient's Chart, lab work & pertinent test results  Airway Mallampati: II      Dental  (+) Teeth Intact   Pulmonary neg pulmonary ROS,  breath sounds clear to auscultation        Cardiovascular hypertension, Pt. on medications Rhythm:Regular Rate:Normal     Neuro/Psych    GI/Hepatic   Endo/Other  Hypothyroidism   Renal/GU      Musculoskeletal  (+) Arthritis -, Rheumatoid disorders,    Abdominal   Peds  Hematology   Anesthesia Other Findings   Reproductive/Obstetrics                           Anesthesia Physical Anesthesia Plan  ASA: III  Anesthesia Plan: MAC   Post-op Pain Management:    Induction: Intravenous  Airway Management Planned: Nasal Cannula  Additional Equipment:   Intra-op Plan:   Post-operative Plan:   Informed Consent: I have reviewed the patients History and Physical, chart, labs and discussed the procedure including the risks, benefits and alternatives for the proposed anesthesia with the patient or authorized representative who has indicated his/her understanding and acceptance.     Plan Discussed with:   Anesthesia Plan Comments:         Anesthesia Quick Evaluation

## 2013-09-14 NOTE — H&P (Signed)
I have reviewed the H&P, the patient was re-examined, and I have identified no interval changes in medical condition and plan of care since the history and physical of record  

## 2013-09-14 NOTE — Transfer of Care (Signed)
Immediate Anesthesia Transfer of Care Note  Patient: Laura Allen  Procedure(s) Performed: Procedure(s) with comments: CATARACT EXTRACTION PHACO AND INTRAOCULAR LENS PLACEMENT (IOC) (Right) - CDE:8.81  Patient Location: Short Stay  Anesthesia Type:MAC  Level of Consciousness: awake  Airway & Oxygen Therapy: Patient Spontanous Breathing  Post-op Assessment: Report given to PACU RN  Post vital signs: Reviewed  Complications: No apparent anesthesia complications

## 2013-09-14 NOTE — Op Note (Signed)
Date of Admission: 09/14/2013  Date of Surgery: 09/14/2013  Pre-Op Dx: Cataract  Right  Eye  Post-Op Dx: Cataract  Right  Eye,  Dx Code 366.19  Surgeon: Gemma Payor, M.D.  Assistants: None  Anesthesia: Topical with MAC  Indications: Painless, progressive loss of vision with compromise of daily activities.  Surgery: Cataract Extraction with Intraocular lens Implant Right Eye  Discription: The patient had dilating drops and viscous lidocaine placed into the right eye in the pre-op holding area. After transfer to the operating room, a time out was performed. The patient was then prepped and draped. Beginning with a 75 degree blade a paracentesis port was made at the surgeon's 2 o'clock position. The anterior chamber was then filled with 1% non-preserved lidocaine. This was followed by filling the anterior chamber with Provisc.  A 2.2mm keratome blade was used to make a clear corneal incision at the temporal limbus.  A bent cystatome needle was used to create a continuous tear capsulotomy. Hydrodissection was performed with balanced salt solution on a Fine canula. The lens nucleus was then removed using the phacoemulsification handpiece. Residual cortex was removed with the I&A handpiece. The anterior chamber and capsular bag were refilled with Provisc. A posterior chamber intraocular lens was placed into the capsular bag with it's injector. The implant was positioned with the Kuglan hook. The Provisc was then removed from the anterior chamber and capsular bag with the I&A handpiece. Stromal hydration of the main incision and paracentesis port was performed with BSS on a Fine canula. The wounds were tested for leak which was negative. The patient tolerated the procedure well. There were no operative complications. The patient was then transferred to the recovery room in stable condition.  Complications: None  Specimen: None  EBL: None  Prosthetic device: B&L enVista, MX60, power 18.0D, SN  [id number].

## 2013-09-18 ENCOUNTER — Encounter (HOSPITAL_COMMUNITY): Payer: Self-pay | Admitting: Ophthalmology

## 2015-05-03 ENCOUNTER — Other Ambulatory Visit (HOSPITAL_COMMUNITY): Payer: Self-pay | Admitting: Orthopaedic Surgery

## 2015-05-09 NOTE — Pre-Procedure Instructions (Signed)
Laura Allen  05/09/2015        Your procedure is scheduled on Monday, May 20, 2015  Report to [facility] Admitting at 10:30 A.M.   Call this number if you have problems the morning of surgery:  [phone]             For other question before surgery, Call [phone] , Monday through Friday from 8:00 am - 4:00 pm.   Remember:  Do not eat food or drink liquids after midnight Sunday, May 19, 2015   Take these medicines the morning of surgery with A SIP OF WATER: diltiazem (CARDIZEM),  levothyroxine (SYNTHROID, LEVOTHROID)      Stop taking Aspirin, vitamins, and herbal medications. Do not take any NSAIDs ie: Ibuprofen, Advil, Naproxen or any medication containing Aspirin; stop 1 week prior to procedure ( Monday, May 13, 2015).      Do not wear jewelry, make-up or nail polish.  Do not wear lotions, powders, or perfumes.  You may not wear deodorant.  Do not shave 48 hours prior to surgery.    Do not bring valuables to the hospital.  [facility] is not responsible for any belongings or valuables.  Contacts, dentures or bridgework may not be worn into surgery.  Leave your suitcase in the car.  After surgery it may be brought to your room.  For patients admitted to the hospital, discharge time will be determined by your treatment team.   Special instructions:    [place] - Preparing for Surgery  Before surgery, you can play an important role.  Because skin is not sterile, your skin needs to be as free of germs as possible.  You can reduce the number of germs on you skin by washing with CHG (chlorahexidine gluconate) soap before surgery.  CHG is an antiseptic cleaner which kills germs and bonds with the skin to continue killing germs even after washing.  Please DO NOT use if you have an allergy to CHG or antibacterial soaps.  If your skin becomes reddened/irritated stop using the CHG and inform your nurse when you arrive at Short Stay.  Do not shave  (including legs and underarms) for at least 48 hours prior to the first CHG shower.  You may shave your face.  Please follow these instructions carefully:   1.  Shower with CHG Soap the night before surgery and the morning of Surgery.  2.  If you choose to wash your hair, wash your hair first as usual with your normal shampoo.  3.  After you shampoo, rinse your hair and body thoroughly to remove the Shampoo.  4.  Use CHG as you would any other liquid soap.  You can apply chg directly  to the skin and wash gently with scrungie or a clean washcloth.  5.  Apply the CHG Soap to your body ONLY FROM THE NECK DOWN.  Do not use on open wounds or open sores.  Avoid contact with your eyes, ears, mouth and genitals (private parts).  Wash genitals (private parts) with your normal soap.  6.  Wash thoroughly, paying special attention to the area where your surgery will be performed.  7.  Thoroughly rinse your body with warm water from the neck down.  8.  DO NOT shower/wash with your normal soap after using and rinsing off the CHG Soap.  9.  Pat yourself dry with a clean towel.  10.  Wear clean pajamas.            11.  Place clean sheets on your bed the night of your first shower and do not sleep with pets.  Day of Surgery  Do not apply any lotions/deodorants the morning of surgery.  Please wear clean clothes to the hospital/surgery center.  Please read over the following fact sheets that you were given. Pain Booklet, Coughing and Deep Breathing, Total Joint Packet, MRSA Information and Surgical Site Infection Prevention

## 2015-05-10 ENCOUNTER — Encounter (HOSPITAL_COMMUNITY): Payer: Self-pay

## 2015-05-10 ENCOUNTER — Encounter (HOSPITAL_COMMUNITY)
Admission: RE | Admit: 2015-05-10 | Discharge: 2015-05-10 | Disposition: A | Discharge status: Home / Self Care | Payer: BC Managed Care – PPO | Source: Ambulatory Visit | Attending: Orthopaedic Surgery | Admitting: Orthopaedic Surgery

## 2015-05-10 ENCOUNTER — Ambulatory Visit (HOSPITAL_COMMUNITY)
Admission: RE | Admit: 2015-05-10 | Discharge: 2015-05-10 | Disposition: A | Discharge status: Home / Self Care | Payer: BC Managed Care – PPO | Source: Ambulatory Visit | Attending: Orthopaedic Surgery | Admitting: Orthopaedic Surgery

## 2015-05-10 DIAGNOSIS — M179 Osteoarthritis of knee, unspecified: Secondary | ICD-10-CM | POA: Insufficient documentation

## 2015-05-10 DIAGNOSIS — Z01810 Encounter for preprocedural cardiovascular examination: Secondary | ICD-10-CM | POA: Insufficient documentation

## 2015-05-10 DIAGNOSIS — Z01818 Encounter for other preprocedural examination: Secondary | ICD-10-CM | POA: Diagnosis not present

## 2015-05-10 DIAGNOSIS — Z01812 Encounter for preprocedural laboratory examination: Secondary | ICD-10-CM | POA: Diagnosis not present

## 2015-05-10 DIAGNOSIS — M1710 Unilateral primary osteoarthritis, unspecified knee: Secondary | ICD-10-CM

## 2015-05-10 HISTORY — DX: Unspecified osteoarthritis, unspecified site: M19.90

## 2015-05-10 HISTORY — DX: Allergy status to unspecified drugs, medicaments and biological substances: Z88.9

## 2015-05-10 HISTORY — DX: Personal history of other medical treatment: Z92.89

## 2015-05-10 LAB — URINE MICROSCOPIC-ADD ON

## 2015-05-10 LAB — COMPREHENSIVE METABOLIC PANEL
ALBUMIN: 3.4 g/dL — AB (ref 3.5–5.0)
ALK PHOS: 95 U/L (ref 38–126)
ALT: 13 U/L — ABNORMAL LOW (ref 14–54)
AST: 16 U/L (ref 15–41)
Anion gap: 8 (ref 5–15)
BILIRUBIN TOTAL: 0.4 mg/dL (ref 0.3–1.2)
BUN: 17 mg/dL (ref 6–20)
CALCIUM: 8.9 mg/dL (ref 8.9–10.3)
CHLORIDE: 101 mmol/L (ref 101–111)
CO2: 29 mmol/L (ref 22–32)
Creatinine, Ser: 0.9 mg/dL (ref 0.44–1.00)
GFR calc Af Amer: >60 mL/min (ref >60)
GLUCOSE: 92 mg/dL (ref 65–99)
Potassium: 3.6 mmol/L (ref 3.5–5.1)
Sodium: 138 mmol/L (ref 135–145)
TOTAL PROTEIN: 7.6 g/dL (ref 6.5–8.1)

## 2015-05-10 LAB — URINALYSIS, ROUTINE W REFLEX MICROSCOPIC
Bilirubin Urine: NEGATIVE
GLUCOSE, UA: NEGATIVE mg/dL
Ketones, ur: NEGATIVE mg/dL
Nitrite: NEGATIVE
Protein, ur: NEGATIVE mg/dL
SPECIFIC GRAVITY, URINE: 1.02 (ref 1.005–1.030)
UROBILINOGEN UA: 0.2 mg/dL (ref 0.0–1.0)
pH: 7 (ref 5.0–8.0)

## 2015-05-10 LAB — CBC
HCT: 42.7 % (ref 36.0–46.0)
HEMOGLOBIN: 13.9 g/dL (ref 12.0–15.0)
MCH: 28.4 pg (ref 26.0–34.0)
MCHC: 32.6 g/dL (ref 30.0–36.0)
MCV: 87.3 fL (ref 78.0–100.0)
Platelets: 426 10*3/uL — ABNORMAL HIGH (ref 150–400)
RBC: 4.89 MIL/uL (ref 3.87–5.11)
RDW: 13.8 % (ref 11.5–15.5)
WBC: 12.6 10*3/uL — AB (ref 4.0–10.5)

## 2015-05-10 LAB — PROTIME-INR
INR: 0.97 (ref 0.00–1.49)
Prothrombin Time: 13.1 seconds (ref 11.6–15.2)

## 2015-05-10 LAB — SURGICAL PCR SCREEN
MRSA, PCR: NEGATIVE
Staphylococcus aureus: POSITIVE — AB

## 2015-05-10 NOTE — Progress Notes (Signed)
   05/10/15 1232  OBSTRUCTIVE SLEEP APNEA  Have you ever been diagnosed with sleep apnea through a sleep study? No (negative sleep study in [place])  Do you snore loudly (loud enough to be heard through closed doors)?  1  Do you often feel tired, fatigued, or sleepy during the daytime? 0  Has anyone observed you stop breathing during your sleep? 0  Do you have, or are you being treated for high blood pressure? 1  BMI more than 35 kg/m2? 1  Age over 62 years old? 1  Neck circumference greater than 40 cm/16 inches? 0  Gender: 0

## 2015-05-19 NOTE — H&P (Signed)
TOTAL KNEE ADMISSION H&P  Patient is being admitted for right total knee arthroplasty.  Subjective:  Chief Complaint:right knee pain.  HPI: Laura Allen, 64 y.o. female, has a history of pain and functional disability in the right knee due to arthritis and has failed non-surgical conservative treatments for greater than 12 weeks to includeNSAID's and/or analgesics, corticosteriod injections, use of assistive devices and activity modification.  Onset of symptoms was gradual, starting 10 years ago with gradually worsening course since that time.  on the right knee(s).  Patient currently rates pain in the right knee(s) at 10 out of 10 with activity. Patient has night pain, pain that interferes with activities of daily living, pain with passive range of motion, crepitus and joint swelling.  Patient has evidence of subchondral sclerosis, periarticular osteophytes and joint space narrowing by imaging studies.  Patient Active Problem List   Diagnosis Date Noted  . Rheumatic joint disease 12/03/2011  . OA (osteoarthritis) of knee 12/03/2011   Past Medical History  Diagnosis Date  . History of underactive thyroid   . Rheumatoid arthritis(714.0)   . Hypertension   . Hypothyroidism   . H/O seasonal allergies   . Osteoarthritis (arthritis due to wear and tear of joints)   . H/O exercise stress test     [facility]    Past Surgical History  Procedure Laterality Date  . Cholecystectomy    . Ovarian cyst surgery    . Cataract extraction w/phaco Left 09/04/2013    Procedure: CATARACT EXTRACTION PHACO AND INTRAOCULAR LENS PLACEMENT (IOC);  Surgeon: Gemma Payor, MD;  Location: [facility];  Service: Ophthalmology;  Laterality: Left;  CDE:  10.22  . Cataract extraction w/phaco Right 09/14/2013    Procedure: CATARACT EXTRACTION PHACO AND INTRAOCULAR LENS PLACEMENT (IOC);  Surgeon: Gemma Payor, MD;  Location: [facility];  Service: Ophthalmology;  Laterality: Right;  CDE:8.81  . Colonoscopy      No  prescriptions prior to admission   Allergies  Allergen Reactions  . Ibuprofen Swelling  . Levofloxacin Hives and Rash  . Streptomycin Hives and Rash    History  Substance Use Topics  . Smoking status: Never Smoker   . Smokeless tobacco: Not on file  . Alcohol Use: No    Family History  Problem Relation Age of Onset  . Arthritis    . Cancer    . Diabetes       Review of Systems  Constitutional: Negative.   HENT: Negative.   Eyes: Negative.   Respiratory: Negative.   Cardiovascular: Negative.   Gastrointestinal: Negative.   Genitourinary: Negative.   Musculoskeletal: Positive for joint pain.  Skin: Negative.   Psychiatric/Behavioral: Negative.     Objective:  Physical Exam  Constitutional: She is oriented to person, place, and time. She appears well-nourished. No distress.  HENT:  Head: Normocephalic.  Eyes: EOM are normal. Pupils are equal, round, and reactive to light.  Neck: Normal range of motion.  Respiratory: No respiratory distress.  GI: She exhibits no distension.  Musculoskeletal: She exhibits tenderness.  Neurological: She is alert and oriented to person, place, and time.  Skin: Skin is warm and dry.  Psychiatric: She has a normal mood and affect.   distal pulses are intact.  Patient is alert and oriented.  Good visual acuity with glasses.  No supraclavicular lymphadenopathy.  She has a rash over the lateral aspect of her left ankle anterior aspect, tenderness of the joint, and she states this is typical for her rheumatoid arthritis  flare in a single joint.  No abdominal tenderness.  Liver and spleen are not palpably enlarged.  Heart is regular.  No audile wheezing.  Height 5 feet  7 inches, weight 214.  BP 111/74.  Pedal pulses are 2+.  Negative straight leg raising.  Anterior tib EHL and gastrocsoleus are intact.  Opposite knee shows less significant varus deformity, crepitus with range of motion, and also has arthritic changes in it. Vital signs in last 24  hours:    Labs:   Estimated body mass index is 34.79 kg/(m^2) as calculated from the following:   Height as of 09/01/13: 5\' 7"  (1.702 m).   Weight as of 09/01/13: 100.789 kg (222 lb 3.2 oz).   Imaging Review Plain radiographs demonstrate moderate degenerative joint disease of the right knee(s). The overall alignment ismild varus. The bone quality appears to be good for age and reported activity level.  Assessment/Plan:  End stage arthritis, right knee   The patient history, physical examination, clinical judgment of the provider and imaging studies are consistent with end stage degenerative joint disease of the right knee(s) and total knee arthroplasty is deemed medically necessary. The treatment options including medical management, injection therapy arthroscopy and arthroplasty were discussed at length. The risks and benefits of total knee arthroplasty were presented and reviewed. The risks due to aseptic loosening, infection, stiffness, patella tracking problems, thromboembolic complications and other imponderables were discussed. The patient acknowledged the explanation, agreed to proceed with the plan and consent was signed. Patient is being admitted for inpatient treatment for surgery, pain control, PT, OT, prophylactic antibiotics, VTE prophylaxis, progressive ambulation and ADL's and discharge planning. The patient is planning to be discharged home with home health services

## 2015-05-20 ENCOUNTER — Inpatient Hospital Stay (HOSPITAL_COMMUNITY): Payer: BC Managed Care – PPO | Admitting: Anesthesiology

## 2015-05-20 ENCOUNTER — Encounter (HOSPITAL_COMMUNITY): Payer: Self-pay | Admitting: *Deleted

## 2015-05-20 ENCOUNTER — Encounter (HOSPITAL_COMMUNITY)
Admission: RE | Disposition: A | Discharge status: Home / Self Care | Payer: Self-pay | Source: Ambulatory Visit | Attending: Orthopaedic Surgery

## 2015-05-20 ENCOUNTER — Inpatient Hospital Stay (HOSPITAL_COMMUNITY)
Admission: RE | Admit: 2015-05-20 | Discharge: 2015-05-22 | DRG: 470 | Disposition: A | Discharge status: Home / Self Care | Payer: BC Managed Care – PPO | Source: Ambulatory Visit | Attending: Orthopaedic Surgery | Admitting: Orthopaedic Surgery

## 2015-05-20 DIAGNOSIS — Z9841 Cataract extraction status, right eye: Secondary | ICD-10-CM | POA: Diagnosis not present

## 2015-05-20 DIAGNOSIS — Z96651 Presence of right artificial knee joint: Secondary | ICD-10-CM

## 2015-05-20 DIAGNOSIS — I10 Essential (primary) hypertension: Secondary | ICD-10-CM | POA: Diagnosis present

## 2015-05-20 DIAGNOSIS — Z9842 Cataract extraction status, left eye: Secondary | ICD-10-CM | POA: Diagnosis not present

## 2015-05-20 DIAGNOSIS — M179 Osteoarthritis of knee, unspecified: Principal | ICD-10-CM | POA: Diagnosis present

## 2015-05-20 DIAGNOSIS — E039 Hypothyroidism, unspecified: Secondary | ICD-10-CM | POA: Diagnosis present

## 2015-05-20 DIAGNOSIS — Z881 Allergy status to other antibiotic agents status: Secondary | ICD-10-CM

## 2015-05-20 DIAGNOSIS — Z961 Presence of intraocular lens: Secondary | ICD-10-CM | POA: Diagnosis present

## 2015-05-20 DIAGNOSIS — Z886 Allergy status to analgesic agent status: Secondary | ICD-10-CM | POA: Diagnosis not present

## 2015-05-20 DIAGNOSIS — M069 Rheumatoid arthritis, unspecified: Secondary | ICD-10-CM | POA: Diagnosis present

## 2015-05-20 HISTORY — PX: KNEE ARTHROPLASTY: SHX992

## 2015-05-20 SURGERY — ARTHROPLASTY, KNEE, TOTAL, USING IMAGELESS COMPUTER-ASSISTED NAVIGATION
Anesthesia: Spinal | Laterality: Right

## 2015-05-20 MED ORDER — POTASSIUM CHLORIDE IN NACL 20-0.45 MEQ/L-% IV SOLN
INTRAVENOUS | Status: DC
  Administered 2015-05-20: 21:00:00 via INTRAVENOUS
  Filled 2015-05-20 (×6): qty 1000

## 2015-05-20 MED ORDER — MENTHOL 3 MG MT LOZG: 1.0000 | LOZENGE | OROMUCOSAL | Status: DC | PRN

## 2015-05-20 MED ORDER — METHOCARBAMOL 1000 MG/10ML IJ SOLN
500.0000 mg | Freq: Four times a day (QID) | INTRAVENOUS | Status: DC | PRN
  Filled 2015-05-20: qty 5

## 2015-05-20 MED ORDER — LACTATED RINGERS IV SOLN: INTRAVENOUS | Status: DC

## 2015-05-20 MED ORDER — PROPOFOL 10 MG/ML IV BOLUS
INTRAVENOUS | Status: AC
  Filled 2015-05-20: qty 20

## 2015-05-20 MED ORDER — MIDAZOLAM HCL 5 MG/5ML IJ SOLN
INTRAMUSCULAR | Status: DC | PRN
  Administered 2015-05-20 (×2): 0.5 mg via INTRAVENOUS

## 2015-05-20 MED ORDER — ACETAMINOPHEN 325 MG PO TABS
650.0000 mg | ORAL_TABLET | Freq: Four times a day (QID) | ORAL | Status: DC | PRN
  Administered 2015-05-21: 650 mg via ORAL
  Filled 2015-05-20: qty 2

## 2015-05-20 MED ORDER — DOCUSATE SODIUM 100 MG PO CAPS
100.0000 mg | ORAL_CAPSULE | Freq: Two times a day (BID) | ORAL | Status: DC
  Administered 2015-05-20 – 2015-05-22 (×4): 100 mg via ORAL
  Filled 2015-05-20 (×4): qty 1

## 2015-05-20 MED ORDER — PROPOFOL INFUSION 10 MG/ML OPTIME
INTRAVENOUS | Status: DC | PRN
  Administered 2015-05-20: 25 ug/kg/min via INTRAVENOUS

## 2015-05-20 MED ORDER — CEFAZOLIN SODIUM-DEXTROSE 2-3 GM-% IV SOLR
INTRAVENOUS | Status: AC
  Administered 2015-05-20: 2 g via INTRAVENOUS
  Filled 2015-05-20: qty 50

## 2015-05-20 MED ORDER — HYDROMORPHONE HCL 1 MG/ML IJ SOLN
INTRAMUSCULAR | Status: AC
  Administered 2015-05-20: 0.5 mg
  Filled 2015-05-20: qty 1

## 2015-05-20 MED ORDER — PHENOL 1.4 % MT LIQD: 1.0000 | OROMUCOSAL | Status: DC | PRN

## 2015-05-20 MED ORDER — LIDOCAINE HCL (CARDIAC) 20 MG/ML IV SOLN
INTRAVENOUS | Status: DC | PRN
  Administered 2015-05-20: 60 mg via INTRAVENOUS
  Administered 2015-05-20: 40 mg via INTRAVENOUS

## 2015-05-20 MED ORDER — ASPIRIN EC 325 MG PO TBEC
325.0000 mg | DELAYED_RELEASE_TABLET | Freq: Every day | ORAL | Status: DC
  Administered 2015-05-21 – 2015-05-22 (×2): 325 mg via ORAL
  Filled 2015-05-20 (×2): qty 1

## 2015-05-20 MED ORDER — HYDROMORPHONE HCL 1 MG/ML IJ SOLN
1.0000 mg | INTRAMUSCULAR | Status: DC | PRN
  Administered 2015-05-20 – 2015-05-21 (×2): 1 mg via INTRAVENOUS
  Filled 2015-05-20 (×2): qty 1

## 2015-05-20 MED ORDER — MEPERIDINE HCL 25 MG/ML IJ SOLN: 6.2500 mg | INTRAMUSCULAR | Status: DC | PRN

## 2015-05-20 MED ORDER — LIDOCAINE HCL (CARDIAC) 20 MG/ML IV SOLN
INTRAVENOUS | Status: AC
  Filled 2015-05-20: qty 5

## 2015-05-20 MED ORDER — CEFAZOLIN SODIUM-DEXTROSE 2-3 GM-% IV SOLR: 2.0000 g | INTRAVENOUS | Status: DC

## 2015-05-20 MED ORDER — METOCLOPRAMIDE HCL 5 MG/ML IJ SOLN: 5.0000 mg | Freq: Three times a day (TID) | INTRAMUSCULAR | Status: DC | PRN

## 2015-05-20 MED ORDER — ONDANSETRON HCL 4 MG/2ML IJ SOLN
INTRAMUSCULAR | Status: AC
  Filled 2015-05-20: qty 2

## 2015-05-20 MED ORDER — OXYCODONE HCL 5 MG PO TABS
5.0000 mg | ORAL_TABLET | ORAL | Status: DC | PRN
  Administered 2015-05-20 – 2015-05-22 (×6): 10 mg via ORAL
  Administered 2015-05-22: 5 mg via ORAL
  Filled 2015-05-20 (×6): qty 2
  Filled 2015-05-20 (×2): qty 1

## 2015-05-20 MED ORDER — SODIUM CHLORIDE 0.9 % IR SOLN
Status: DC | PRN
  Administered 2015-05-20: 4000 mL

## 2015-05-20 MED ORDER — LORATADINE 10 MG PO TABS
10.0000 mg | ORAL_TABLET | Freq: Every day | ORAL | Status: DC | PRN
Start: 2015-05-20 — End: 2015-05-22

## 2015-05-20 MED ORDER — ONDANSETRON HCL 4 MG/2ML IJ SOLN
INTRAMUSCULAR | Status: DC | PRN
  Administered 2015-05-20: 4 mg via INTRAVENOUS

## 2015-05-20 MED ORDER — ADALIMUMAB 40 MG/0.8ML ~~LOC~~ KIT: 40.0000 mg | PACK | SUBCUTANEOUS | Status: DC

## 2015-05-20 MED ORDER — FOLIC ACID 1 MG PO TABS
1.0000 mg | ORAL_TABLET | Freq: Every day | ORAL | Status: DC
  Administered 2015-05-21 – 2015-05-22 (×2): 1 mg via ORAL
  Filled 2015-05-20 (×2): qty 1

## 2015-05-20 MED ORDER — FENTANYL CITRATE (PF) 250 MCG/5ML IJ SOLN
INTRAMUSCULAR | Status: AC
  Filled 2015-05-20: qty 5

## 2015-05-20 MED ORDER — ACETAMINOPHEN 650 MG RE SUPP: 650.0000 mg | Freq: Four times a day (QID) | RECTAL | Status: DC | PRN

## 2015-05-20 MED ORDER — HYDROCHLOROTHIAZIDE 25 MG PO TABS: 25.0000 mg | ORAL_TABLET | Freq: Every day | ORAL | Status: DC | PRN

## 2015-05-20 MED ORDER — MIDAZOLAM HCL 2 MG/2ML IJ SOLN
INTRAMUSCULAR | Status: AC
  Filled 2015-05-20: qty 2

## 2015-05-20 MED ORDER — ONDANSETRON HCL 4 MG PO TABS: 4.0000 mg | ORAL_TABLET | Freq: Four times a day (QID) | ORAL | Status: DC | PRN

## 2015-05-20 MED ORDER — FENTANYL CITRATE (PF) 100 MCG/2ML IJ SOLN
INTRAMUSCULAR | Status: DC | PRN
  Administered 2015-05-20: 50 ug via INTRAVENOUS

## 2015-05-20 MED ORDER — PROMETHAZINE HCL 25 MG/ML IJ SOLN: 6.2500 mg | INTRAMUSCULAR | Status: DC | PRN

## 2015-05-20 MED ORDER — DILTIAZEM HCL ER 60 MG PO CP12
120.0000 mg | ORAL_CAPSULE | Freq: Two times a day (BID) | ORAL | Status: DC
  Administered 2015-05-21 – 2015-05-22 (×2): 120 mg via ORAL
  Filled 2015-05-20 (×5): qty 2

## 2015-05-20 MED ORDER — DILTIAZEM HCL ER 60 MG PO CP12: 120.0000 mg | ORAL_CAPSULE | Freq: Every day | ORAL | Status: DC

## 2015-05-20 MED ORDER — HYDROMORPHONE HCL 1 MG/ML IJ SOLN: 0.2500 mg | INTRAMUSCULAR | Status: DC | PRN

## 2015-05-20 MED ORDER — LEVOTHYROXINE SODIUM 88 MCG PO TABS
88.0000 ug | ORAL_TABLET | Freq: Every day | ORAL | Status: DC
  Administered 2015-05-21 – 2015-05-22 (×2): 88 ug via ORAL
  Filled 2015-05-20 (×2): qty 1

## 2015-05-20 MED ORDER — POLYETHYLENE GLYCOL 3350 17 G PO PACK: 17.0000 g | PACK | Freq: Every day | ORAL | Status: DC | PRN

## 2015-05-20 MED ORDER — METHOCARBAMOL 500 MG PO TABS
500.0000 mg | ORAL_TABLET | Freq: Four times a day (QID) | ORAL | Status: DC | PRN
  Administered 2015-05-20 – 2015-05-22 (×2): 500 mg via ORAL
  Filled 2015-05-20 (×2): qty 1

## 2015-05-20 MED ORDER — CHLORHEXIDINE GLUCONATE 4 % EX LIQD: 60.0000 mL | Freq: Once | CUTANEOUS | Status: DC

## 2015-05-20 MED ORDER — METHOTREXATE 2.5 MG PO TABS: 12.5000 mg | ORAL_TABLET | ORAL | Status: DC

## 2015-05-20 MED ORDER — ONDANSETRON HCL 4 MG/2ML IJ SOLN: 4.0000 mg | Freq: Four times a day (QID) | INTRAMUSCULAR | Status: DC | PRN

## 2015-05-20 MED ORDER — LACTATED RINGERS IV SOLN
INTRAVENOUS | Status: DC
  Administered 2015-05-20 (×2): via INTRAVENOUS

## 2015-05-20 MED ORDER — LEVOTHYROXINE SODIUM 88 MCG PO TABS: 88.0000 ug | ORAL_TABLET | Freq: Every morning | ORAL | Status: DC

## 2015-05-20 MED ORDER — METOCLOPRAMIDE HCL 5 MG PO TABS: 5.0000 mg | ORAL_TABLET | Freq: Three times a day (TID) | ORAL | Status: DC | PRN

## 2015-05-20 SURGICAL SUPPLY — 74 items
APL SKNCLS STERI-STRIP NONHPOA (GAUZE/BANDAGES/DRESSINGS) ×1
BANDAGE ELASTIC 4 VELCRO ST LF (GAUZE/BANDAGES/DRESSINGS) ×2 IMPLANT
BANDAGE ELASTIC 6 VELCRO ST LF (GAUZE/BANDAGES/DRESSINGS) ×1 IMPLANT
BANDAGE ESMARK 6X9 LF (GAUZE/BANDAGES/DRESSINGS) ×1 IMPLANT
BENZOIN TINCTURE PRP APPL 2/3 (GAUZE/BANDAGES/DRESSINGS) ×2 IMPLANT
BLADE SAGITTAL 25.0X1.19X90 (BLADE) ×2 IMPLANT
BLADE SAW SGTL 13X75X1.27 (BLADE) ×2 IMPLANT
BNDG CMPR 9X6 STRL LF SNTH (GAUZE/BANDAGES/DRESSINGS) ×1
BNDG CMPR MED 10X6 ELC LF (GAUZE/BANDAGES/DRESSINGS) ×1
BNDG ELASTIC 6X10 VLCR STRL LF (GAUZE/BANDAGES/DRESSINGS) ×2 IMPLANT
BNDG ESMARK 6X9 LF (GAUZE/BANDAGES/DRESSINGS) ×2
BOWL SMART MIX CTS (DISPOSABLE) ×2 IMPLANT
CAP KNEE TOTAL 3 SIGMA ×1 IMPLANT
CEMENT HV SMART SET (Cement) ×4 IMPLANT
CLSR STERI-STRIP ANTIMIC 1/2X4 (GAUZE/BANDAGES/DRESSINGS) ×4 IMPLANT
COVER SURGICAL LIGHT HANDLE (MISCELLANEOUS) ×2 IMPLANT
CUFF TOURNIQUET SINGLE 34IN LL (TOURNIQUET CUFF) ×2 IMPLANT
CUFF TOURNIQUET SINGLE 44IN (TOURNIQUET CUFF) IMPLANT
DRAPE ORTHO SPLIT 77X108 STRL (DRAPES) ×4
DRAPE SURG ORHT 6 SPLT 77X108 (DRAPES) ×2 IMPLANT
DRAPE U-SHAPE 47X51 STRL (DRAPES) ×2 IMPLANT
DRSG PAD ABDOMINAL 8X10 ST (GAUZE/BANDAGES/DRESSINGS) ×4 IMPLANT
DURAPREP 26ML APPLICATOR (WOUND CARE) ×2 IMPLANT
ELECT REM PT RETURN 9FT ADLT (ELECTROSURGICAL) ×2
ELECTRODE REM PT RTRN 9FT ADLT (ELECTROSURGICAL) ×1 IMPLANT
FACESHIELD WRAPAROUND (MASK) ×4 IMPLANT
FACESHIELD WRAPAROUND OR TEAM (MASK) ×2 IMPLANT
GAUZE SPONGE 4X4 12PLY STRL (GAUZE/BANDAGES/DRESSINGS) ×4 IMPLANT
GAUZE XEROFORM 1X8 LF (GAUZE/BANDAGES/DRESSINGS) ×1 IMPLANT
GAUZE XEROFORM 5X9 LF (GAUZE/BANDAGES/DRESSINGS) ×2 IMPLANT
GLOVE BIOGEL PI IND STRL 8 (GLOVE) ×2 IMPLANT
GLOVE BIOGEL PI INDICATOR 8 (GLOVE) ×2
GLOVE ORTHO TXT STRL SZ7.5 (GLOVE) ×4 IMPLANT
GOWN STRL REUS W/ TWL LRG LVL3 (GOWN DISPOSABLE) ×1 IMPLANT
GOWN STRL REUS W/ TWL XL LVL3 (GOWN DISPOSABLE) ×1 IMPLANT
GOWN STRL REUS W/TWL 2XL LVL3 (GOWN DISPOSABLE) ×2 IMPLANT
GOWN STRL REUS W/TWL LRG LVL3 (GOWN DISPOSABLE) ×2
GOWN STRL REUS W/TWL XL LVL3 (GOWN DISPOSABLE) ×2
HANDPIECE INTERPULSE COAX TIP (DISPOSABLE) ×2
IMMOBILIZER KNEE 22 (SOFTGOODS) ×1 IMPLANT
IMMOBILIZER KNEE 22 UNIV (SOFTGOODS) ×2 IMPLANT
KIT BASIN OR (CUSTOM PROCEDURE TRAY) ×2 IMPLANT
KIT ROOM TURNOVER OR (KITS) ×2 IMPLANT
MANIFOLD NEPTUNE II (INSTRUMENTS) ×2 IMPLANT
MARKER SPHERE PSV REFLC THRD 5 (MARKER) ×6 IMPLANT
NDL HYPO 25GX1X1/2 BEV (NEEDLE) ×1 IMPLANT
NEEDLE HYPO 25GX1X1/2 BEV (NEEDLE) ×2 IMPLANT
NS IRRIG 1000ML POUR BTL (IV SOLUTION) ×2 IMPLANT
PACK TOTAL JOINT (CUSTOM PROCEDURE TRAY) ×2 IMPLANT
PACK UNIVERSAL I (CUSTOM PROCEDURE TRAY) ×2 IMPLANT
PAD ABD 8X10 STRL (GAUZE/BANDAGES/DRESSINGS) ×1 IMPLANT
PAD ARMBOARD 7.5X6 YLW CONV (MISCELLANEOUS) ×4 IMPLANT
PAD CAST 4YDX4 CTTN HI CHSV (CAST SUPPLIES) ×1 IMPLANT
PADDING CAST COTTON 4X4 STRL (CAST SUPPLIES) ×2
PADDING CAST COTTON 6X4 STRL (CAST SUPPLIES) ×3 IMPLANT
PIN SCHANZ 4MM 130MM (PIN) ×8 IMPLANT
SET HNDPC FAN SPRY TIP SCT (DISPOSABLE) ×1 IMPLANT
STAPLER VISISTAT 35W (STAPLE) IMPLANT
STRIP CLOSURE SKIN 1/2X4 (GAUZE/BANDAGES/DRESSINGS) ×1 IMPLANT
SUCTION FRAZIER TIP 10 FR DISP (SUCTIONS) ×2 IMPLANT
SUT ETHILON 3 0 PS 1 (SUTURE) ×1 IMPLANT
SUT VIC AB 0 CT1 27 (SUTURE) ×2
SUT VIC AB 0 CT1 27XBRD ANBCTR (SUTURE) IMPLANT
SUT VIC AB 0 CTX 36 (SUTURE)
SUT VIC AB 0 CTX36XBRD ANTBCTR (SUTURE) IMPLANT
SUT VIC AB 1 CTX 36 (SUTURE) ×4
SUT VIC AB 1 CTX36XBRD ANBCTR (SUTURE) ×2 IMPLANT
SUT VIC AB 2-0 CT1 27 (SUTURE) ×6
SUT VIC AB 2-0 CT1 TAPERPNT 27 (SUTURE) ×2 IMPLANT
SUT VIC AB 3-0 X1 27 (SUTURE) ×4 IMPLANT
SYR CONTROL 10ML LL (SYRINGE) ×2 IMPLANT
TOWEL OR 17X24 6PK STRL BLUE (TOWEL DISPOSABLE) ×2 IMPLANT
TOWEL OR 17X26 10 PK STRL BLUE (TOWEL DISPOSABLE) ×2 IMPLANT
WATER STERILE IRR 1000ML POUR (IV SOLUTION) ×2 IMPLANT

## 2015-05-20 NOTE — Anesthesia Procedure Notes (Signed)
Spinal Patient location during procedure: OR Start time: 05/20/2015 1:35 PM End time: 05/20/2015 1:37 PM Staffing Anesthesiologist: Laverle Hobby Preanesthetic Checklist Completed: patient identified, site marked, surgical consent, pre-op evaluation, timeout performed, IV checked, risks and benefits discussed and monitors and equipment checked Spinal Block Patient position: sitting Prep: DuraPrep Patient monitoring: heart rate, cardiac monitor, continuous pulse ox and blood pressure Approach: midline Location: L3-4 Injection technique: single-shot Needle Needle type: Quincke  Needle gauge: 22 G Needle length: 9 cm Needle insertion depth: 5 cm Assessment Sensory level: T12 Additional Notes Pt accepts procedure w/ risks. 22ga needle w/ clear free flow CSF w/o blood. Pt tolerated well w/o difficulty or discomfort. GES

## 2015-05-20 NOTE — Transfer of Care (Signed)
Immediate Anesthesia Transfer of Care Note  Patient: Laura Allen  Procedure(s) Performed: Procedure(s): COMPUTER ASSISTED TOTAL KNEE ARTHROPLASTY (Right)  Patient Location: PACU  Anesthesia Type:Spinal  Level of Consciousness: awake, alert , oriented and patient cooperative  Airway & Oxygen Therapy: Patient Spontanous Breathing and Patient connected to nasal cannula oxygen  Post-op Assessment: Report given to RN and Post -op Vital signs reviewed and stable  Post vital signs: Reviewed and stable  Last Vitals:  Filed Vitals:   05/20/15 1027  BP: 127/88  Pulse: 96  Temp: 36.9 C  Resp: 20    Complications: No apparent anesthesia complications and moving right foot; sensory mid thigh on the right

## 2015-05-20 NOTE — Anesthesia Preprocedure Evaluation (Addendum)
Anesthesia Evaluation  Patient identified by MRN, date of birth, ID band Patient awake    Reviewed: Allergy & Precautions, NPO status , Patient's Chart, lab work & pertinent test results, reviewed documented beta blocker date and time   Airway Mallampati: II  TM Distance: >3 FB Neck ROM: Full    Dental  (+) Teeth Intact, Dental Advisory Given, Caps   Pulmonary          Cardiovascular hypertension, Pt. on medications     Neuro/Psych    GI/Hepatic   Endo/Other  Hypothyroidism Took replacement this morning  Renal/GU      Musculoskeletal  (+) Arthritis -, Osteoarthritis and Rheumatoid disorders,  On humira and methotrexate   Abdominal   Peds  Hematology   Anesthesia Other Findings Right incisor  Reproductive/Obstetrics                           Anesthesia Physical Anesthesia Plan  ASA: II  Anesthesia Plan: Spinal   Post-op Pain Management:    Induction:   Airway Management Planned: Nasal Cannula  Additional Equipment:   Intra-op Plan:   Post-operative Plan:   Informed Consent:   Dental advisory given  Plan Discussed with: CRNA, Anesthesiologist and Surgeon  Anesthesia Plan Comments:         Anesthesia Quick Evaluation

## 2015-05-20 NOTE — Progress Notes (Signed)
Orthopedic Tech Progress Note Patient Details:  Laura Allen 07-28-1951 [id number]  Applied CPM to RLE. CPM Right Knee Right Knee Flexion (Degrees): 60 Right Knee Extension (Degrees): 0   Lesle Chris 05/20/2015, 4:43 PM

## 2015-05-20 NOTE — Interval H&P Note (Signed)
History and Physical Interval Note:  05/20/2015 12:14 PM  Laura Allen  has presented today for surgery, with the diagnosis of Right Knee Osteoarthritis  The various methods of treatment have been discussed with the patient and family. After consideration of risks, benefits and other options for treatment, the patient has consented to  Procedure(s): COMPUTER ASSISTED TOTAL KNEE ARTHROPLASTY (Right) as a surgical intervention .  The patient's history has been reviewed, patient examined, no change in status, stable for surgery.  I have reviewed the patient's chart and labs.  Questions were answered to the patient's satisfaction.     Lakendria Nicastro C

## 2015-05-20 NOTE — Brief Op Note (Signed)
05/20/2015  4:10 PM  PATIENT:  Laura Allen  64 y.o. female  PRE-OPERATIVE DIAGNOSIS:  Right Knee Osteoarthritis  POST-OPERATIVE DIAGNOSIS:  Right Knee Osteoarthritis   PROCEDURE:  Procedure(s): COMPUTER ASSISTED TOTAL KNEE ARTHROPLASTY (Right)  SURGEON:  Surgeon(s) and Role:    Eldred Manges, MD - Primary  PHYSICIAN ASSISTANT: Quan Cybulski. Lucia Gaskins    ANESTHESIA:   epidural  EBL:  Total I/O In: 1500 [I.V.:1500] Out: 50 [Blood:50]  BLOOD ADMINISTERED:none  DRAINS: none   LOCAL MEDICATIONS USED:  NONE  SPECIMEN:  No Specimen  DISPOSITION OF SPECIMEN:  N/A  COUNTS:  YES  TOURNIQUET:  * Missing tourniquet times found for documented tourniquets in log:  [id number] *  PATIENT DISPOSITION:  PACU - hemodynamically stable.

## 2015-05-20 NOTE — Progress Notes (Signed)
It appears Ms. Herdt is due for her Humira dose today, however, we do not dispense this inpatient.  Please reschedule dose for after discharge.  Thanks! Tad Moore, BCPS  Clinical Pharmacist Pager [phone]  05/20/2015 5:58 PM

## 2015-05-21 ENCOUNTER — Encounter (HOSPITAL_COMMUNITY): Payer: Self-pay | Admitting: Orthopaedic Surgery

## 2015-05-21 LAB — CBC
HCT: 36.3 % (ref 36.0–46.0)
Hemoglobin: 11.9 g/dL — ABNORMAL LOW (ref 12.0–15.0)
MCH: 28.7 pg (ref 26.0–34.0)
MCHC: 32.8 g/dL (ref 30.0–36.0)
MCV: 87.7 fL (ref 78.0–100.0)
Platelets: 255 K/uL (ref 150–400)
RBC: 4.14 MIL/uL (ref 3.87–5.11)
RDW: 13.9 % (ref 11.5–15.5)
WBC: 14.1 K/uL — ABNORMAL HIGH (ref 4.0–10.5)

## 2015-05-21 LAB — BASIC METABOLIC PANEL WITH GFR
Anion gap: 8 (ref 5–15)
BUN: 15 mg/dL (ref 6–20)
CO2: 28 mmol/L (ref 22–32)
Calcium: 8.7 mg/dL — ABNORMAL LOW (ref 8.9–10.3)
Chloride: 98 mmol/L — ABNORMAL LOW (ref 101–111)
Creatinine, Ser: 0.82 mg/dL (ref 0.44–1.00)
GFR calc Af Amer: >60 mL/min
GFR calc non Af Amer: >60 mL/min
Glucose, Bld: 134 mg/dL — ABNORMAL HIGH (ref 65–99)
Potassium: 3.6 mmol/L (ref 3.5–5.1)
Sodium: 134 mmol/L — ABNORMAL LOW (ref 135–145)

## 2015-05-21 NOTE — Progress Notes (Signed)
Physical Therapy Treatment Patient Details Name: Laura Allen MRN: [id number] DOB: [birth year] Today's Date: 05/21/2015    History of Present Illness Rt TKA    PT Comments    Patient is making good progress with PT.  From a mobility standpoint anticipate patient will be ready for DC home.     Follow Up Recommendations  Home health PT     Equipment Recommendations  None recommended by PT    Recommendations for Other Services       Precautions / Restrictions Precautions Precautions: Fall;Knee Precaution Booklet Issued: Yes (comment) Precaution Comments: provided with TKA HEP handout Restrictions Weight Bearing Restrictions: Yes RLE Weight Bearing: Weight bearing as tolerated    Mobility  Bed Mobility Overal bed mobility: Needs Assistance Bed Mobility: Sit to Supine     Supine to sit: Mod assist     General bed mobility comments: assist with Rt LE  Transfers Overall transfer level: Needs assistance Equipment used: Rolling walker (2 wheeled) Transfers: Sit to/from Stand Sit to Stand: Min assist         General transfer comment: Extra time and effort but ultimately min A from elevated bed height. Assist for balance during power up.  Ambulation/Gait Ambulation/Gait assistance: Min assist Ambulation Distance (Feet): 40 Feet Assistive device: Rolling walker (2 wheeled) Gait Pattern/deviations: Step-to pattern   Gait velocity interpretation: Below normal speed for age/gender     Stairs            Wheelchair Mobility    Modified Rankin (Stroke Patients Only)       Balance Overall balance assessment: Needs assistance Sitting-balance support: No upper extremity supported Sitting balance-Leahy Scale: Good     Standing balance support: Bilateral upper extremity supported Standing balance-Leahy Scale: Poor Standing balance comment: rw for balance                    Cognition Arousal/Alertness: Awake/alert Behavior During Therapy:  WFL for tasks assessed/performed Overall Cognitive Status: Within Functional Limits for tasks assessed                      Exercises Total Joint Exercises Ankle Circles/Pumps: AROM;Both;15 reps;Supine Quad Sets: Strengthening;Right;10 reps;Supine Heel Slides: Right;AAROM;10 reps;Supine Goniometric ROM: 10-72    General Comments        Pertinent Vitals/Pain Pain Assessment: 0-10 Pain Score: 4  Pain Location: Rt knee Pain Descriptors / Indicators: Sore Pain Intervention(s): Patient requesting pain meds-RN notified    Home Living Family/patient expects to be discharged to:: Private residence Living Arrangements: Spouse/significant other Available Help at Discharge: Family Type of Home: House Home Access: Ramped entrance   Home Layout: Able to live on main level with bedroom/bathroom Home Equipment: Walker - 2 wheels;Bedside commode;Adaptive equipment Additional Comments: Planning to stay at mother's home with husband and brothers help    Prior Function Level of Independence: Independent          PT Goals (current goals can now be found in the care plan section) Acute Rehab PT Goals Patient Stated Goal: to go home tomorrow (7/13) PT Goal Formulation: With patient Time For Goal Achievement: 06/04/15 Potential to Achieve Goals: Good Progress towards PT goals: Progressing toward goals    Frequency  7X/week    PT Plan Current plan remains appropriate    Co-evaluation             End of Session Equipment Utilized During Treatment: Gait belt Activity Tolerance: Patient tolerated treatment well Patient left: in  bed;with call bell/phone within reach;with family/visitor present     Time: 7034-0352 PT Time Calculation (min) (ACUTE ONLY): 24 min  Charges:  $Gait Training: 8-22 mins $Therapeutic Exercise: 8-22 mins                    G Codes:      Christiane Ha, PT, CSCS Pager [phone] Office [phone]  05/21/2015, 3:18 PM

## 2015-05-21 NOTE — Progress Notes (Signed)
Occupational Therapy Evaluation Patient Details Name: Laura Allen MRN: [id number] DOB: 1951/10/16 Today's Date: 05/21/2015    History of Present Illness Rt TKA   Clinical Impression   Pt admitted with the above diagnoses and presents with below problem list. Pt will benefit from continued acute OT to address the below listed deficits and maximize independence with BADLs prior to d/c home with family assisting. PTA pt was independent with ADLs. Pt is currently min A for LB ADLs and transfers. OT to continue to follow acutely.      Follow Up Recommendations  Supervision/Assistance - 24 hour;No OT follow up    Equipment Recommendations  None recommended by OT    Recommendations for Other Services       Precautions / Restrictions Precautions Precautions: Fall;Knee Precaution Comments: reviewed Restrictions Weight Bearing Restrictions: Yes RLE Weight Bearing: Weight bearing as tolerated      Mobility Bed Mobility Overal bed mobility: Needs Assistance Bed Mobility: Supine to Sit     Supine to sit: Min assist     General bed mobility comments: Pt able to slide RLE across mattress with min A from therapist.   Transfers Overall transfer level: Needs assistance Equipment used: Rolling walker (2 wheeled) Transfers: Sit to/from Stand Sit to Stand: Min assist;From elevated surface         General transfer comment: Extra time and effort but ultimately min A from elevated bed height. Assist for balance during power up.    Balance Overall balance assessment: Needs assistance Sitting-balance support: Single extremity supported Sitting balance-Leahy Scale: Poor     Standing balance support: Bilateral upper extremity supported;During functional activity Standing balance-Leahy Scale: Poor Standing balance comment: rw for balance                            ADL Overall ADL's : Needs assistance/impaired Eating/Feeding: Set up;Sitting   Grooming: Set  up;Sitting   Upper Body Bathing: Set up;Sitting   Lower Body Bathing: Minimal assistance;Sit to/from stand   Upper Body Dressing : Set up;Sitting   Lower Body Dressing: Minimal assistance;Sit to/from stand Lower Body Dressing Details (indicate cue type and reason): able to reach straight down RLE to access feet Toilet Transfer: Minimal assistance;Ambulation;BSC;RW   Toileting- Clothing Manipulation and Hygiene: Minimal assistance;Sit to/from stand     Tub/Shower Transfer Details (indicate cue type and reason): plans to sponge bath until walk-in shower installed soon. Reviewed shower transfer technique. Functional mobility during ADLs: Minimal assistance;Rolling walker (6-7 feet from EOB to recliner. ) General ADL Comments: Upon therapist arrival pt reporting she had just returned from ambulating to/from bathroom. Pt reporting increased pain with nurse giving pain meds at start of session. Pt ambulated 6-7 feet from EOB to recliner with min A for balance. Educated on techniques for LB ADLs with knee precautions. Reviewed home setup safety (remove throw rugs, place bag on rw, etc)     Vision     Perception     Praxis      Pertinent Vitals/Pain Pain Assessment: 0-10 Pain Score: 8  Pain Location: Rt knee Pain Descriptors / Indicators: Aching;Sore Pain Intervention(s): Limited activity within patient's tolerance;Monitored during session;Repositioned;RN gave pain meds during session;Ice applied     Hand Dominance     Extremity/Trunk Assessment Upper Extremity Assessment Upper Extremity Assessment: Overall WFL for tasks assessed   Lower Extremity Assessment Lower Extremity Assessment: Defer to PT evaluation       Communication Communication Communication: No  difficulties   Cognition Arousal/Alertness: Awake/alert Behavior During Therapy: WFL for tasks assessed/performed Overall Cognitive Status: Within Functional Limits for tasks assessed                      General Comments       Exercises       Shoulder Instructions      Home Living Family/patient expects to be discharged to:: Private residence Living Arrangements: Spouse/significant other Available Help at Discharge: Family Type of Home: House Home Access: Ramped entrance     Home Layout: Able to live on main level with bedroom/bathroom     Bathroom Shower/Tub: Walk-in shower;Other (comment) (getting walk-in shower installed ASAP)     Bathroom Accessibility: Yes How Accessible: Accessible via walker Home Equipment: Walker - 2 wheels;Bedside commode;Adaptive equipment Adaptive Equipment: Reacher Additional Comments: Planning to stay at mother's home with husband and brothers help      Prior Functioning/Environment Level of Independence: Independent             OT Diagnosis: Acute pain   OT Problem List: Impaired balance (sitting and/or standing);Decreased knowledge of use of DME or AE;Decreased knowledge of precautions;Pain   OT Treatment/Interventions: Self-care/ADL training;DME and/or AE instruction;Therapeutic activities;Patient/family education;Balance training    OT Goals(Current goals can be found in the care plan section) Acute Rehab OT Goals Patient Stated Goal: to go home tomorrow (7/13) OT Goal Formulation: With patient Time For Goal Achievement: 05/28/15 Potential to Achieve Goals: Good ADL Goals Pt Will Perform Grooming: with min guard assist;standing Pt Will Perform Lower Body Bathing: with min guard assist;sit to/from stand Pt Will Perform Lower Body Dressing: with min guard assist;sit to/from stand Pt Will Transfer to Toilet: with min guard assist;ambulating (3n1 over toilet) Pt Will Perform Toileting - Clothing Manipulation and hygiene: with min guard assist;sit to/from stand Additional ADL Goal #1: Pt will complete bed mobility at mod I level to prepare for OOB ADLs.  OT Frequency: Min 2X/week   Barriers to D/C:            Co-evaluation               End of Session Equipment Utilized During Treatment: Gait belt;Rolling walker CPM Right Knee Additional Comments: rolled blanket under Rt heel to promote knee extension  Activity Tolerance: Patient limited by pain;Patient tolerated treatment well Patient left: in chair;with call bell/phone within reach;with family/visitor present   Time: 1345-1410 OT Time Calculation (min): 25 min Charges:  OT General Charges $OT Visit: 1 Procedure OT Evaluation $Initial OT Evaluation Tier I: 1 Procedure OT Treatments $Self Care/Home Management : 8-22 mins G-Codes:    Pilar Grammes Jun 14, 2015, 2:22 PM

## 2015-05-21 NOTE — Progress Notes (Signed)
Utilization review completed. Quante Pettry, RN, BSN. 

## 2015-05-21 NOTE — Evaluation (Signed)
Physical Therapy Evaluation Patient Details Name: Laura Allen MRN: [id number] DOB: Oct 03, 1951 Today's Date: 05/21/2015   History of Present Illness  Rt TKA  Clinical Impression  Pt is s/p TKA resulting in the deficits listed below (see PT Problem List). Pt will benefit from skilled PT to increase their independence and safety with mobility to allow discharge to home with family assist.      Follow Up Recommendations Home health PT    Equipment Recommendations  None recommended by PT    Recommendations for Other Services       Precautions / Restrictions Precautions Precautions: Fall;Knee Restrictions Weight Bearing Restrictions: Yes RLE Weight Bearing: Weight bearing as tolerated      Mobility  Bed Mobility Overal bed mobility: Needs Assistance Bed Mobility: Supine to Sit     Supine to sit: Mod assist     General bed mobility comments: Assit with Rt LE  Transfers Overall transfer level: Needs assistance Equipment used: Rolling walker (2 wheeled) Transfers: Sit to/from Stand Sit to Stand: Mod assist;From elevated surface         General transfer comment: cues for hand placement  Ambulation/Gait Ambulation/Gait assistance: Mod assist Ambulation Distance (Feet): 3 Feet Assistive device: Rolling walker (2 wheeled) Gait Pattern/deviations: Step-to pattern;Decreased weight shift to right   Gait velocity interpretation: Below normal speed for age/gender    Stairs            Wheelchair Mobility    Modified Rankin (Stroke Patients Only)       Balance Overall balance assessment: Needs assistance Sitting-balance support: Single extremity supported Sitting balance-Leahy Scale: Poor     Standing balance support: Bilateral upper extremity supported Standing balance-Leahy Scale: Poor                               Pertinent Vitals/Pain Pain Assessment: 0-10 Pain Score: 5  Pain Location: Rt knee Pain Descriptors / Indicators:  Sore Pain Intervention(s): Limited activity within patient's tolerance;Monitored during session;Repositioned    Home Living Family/patient expects to be discharged to:: Private residence Living Arrangements: Spouse/significant other Available Help at Discharge: Family Type of Home: House Home Access: Ramped entrance     Home Layout: Able to live on main level with bedroom/bathroom Home Equipment: Environmental consultant - 2 wheels Additional Comments: Planning to stay at mother's home with husband and brothers help    Prior Function Level of Independence: Independent               Hand Dominance        Extremity/Trunk Assessment               Lower Extremity Assessment: Overall WFL for tasks assessed (max assist with RLE for straight leg raise)         Communication   Communication: No difficulties  Cognition Arousal/Alertness: Awake/alert Behavior During Therapy: WFL for tasks assessed/performed Overall Cognitive Status: Within Functional Limits for tasks assessed                      General Comments      Exercises        Assessment/Plan    PT Assessment Patient needs continued PT services  PT Diagnosis Difficulty walking;Generalized weakness   PT Problem List Decreased strength;Decreased range of motion;Decreased activity tolerance;Decreased balance;Decreased mobility  PT Treatment Interventions DME instruction;Gait training;Stair training;Functional mobility training;Therapeutic activities;Therapeutic exercise;Patient/family education   PT Goals (Current goals can be found  in the Care Plan section) Acute Rehab PT Goals Patient Stated Goal: Be able to go home PT Goal Formulation: With patient Time For Goal Achievement: 06/04/15 Potential to Achieve Goals: Good    Frequency 7X/week   Barriers to discharge        Co-evaluation               End of Session Equipment Utilized During Treatment: Gait belt Activity Tolerance: Patient limited by  fatigue Patient left: in chair;with call bell/phone within reach Nurse Communication: Mobility status         Time: 8676-7209 PT Time Calculation (min) (ACUTE ONLY): 30 min   Charges:   PT Evaluation $Initial PT Evaluation Tier I: 1 Procedure PT Treatments $Therapeutic Activity: 8-22 mins   PT G Codes:        Christiane Ha, PT, CSCS Pager [phone] Office [phone]  05/21/2015, 11:15 AM

## 2015-05-21 NOTE — Progress Notes (Signed)
Subjective: 1 Day Post-Op Procedure(s) (LRB): COMPUTER ASSISTED TOTAL KNEE ARTHROPLASTY (Right) Patient reports pain as 7 on 0-10 scale.    Objective: Vital signs in last 24 hours: Temp:  [97.9 F (36.6 C)-98.6 F (37 C)] 98.6 F (37 C) (07/12 0440) Pulse Rate:  [66-96] 80 (07/12 0440) Resp:  [11-20] 14 (07/12 0440) BP: (99-130)/(43-88) 115/55 mmHg (07/12 0440) SpO2:  [96 %-100 %] 96 % (07/12 0440) Weight:  [102.513 kg (226 lb)] 102.513 kg (226 lb) (07/11 1027)  Intake/Output from previous day: 07/11 0701 - 07/12 0700 In: 1740 [P.O.:240; I.V.:1500] Out: 50 [Blood:50] Intake/Output this shift:     Recent Labs  05/21/15 0424  HGB 11.9*    Recent Labs  05/21/15 0424  WBC 14.1*  RBC 4.14  HCT 36.3  PLT 255    Recent Labs  05/21/15 0424  NA 134*  K 3.6  CL 98*  CO2 28  BUN 15  CREATININE 0.82  GLUCOSE 134*  CALCIUM 8.7*   No results for input(s): LABPT, INR in the last 72 hours.  Neurologically intact  Assessment/Plan: 1 Day Post-Op Procedure(s) (LRB): COMPUTER ASSISTED TOTAL KNEE ARTHROPLASTY (Right) Up with therapy  Mohab Ashby C 05/21/2015, 7:50 AM

## 2015-05-21 NOTE — Op Note (Signed)
Laura Allen, Laura Allen             ACCOUNT NO.:  [id number]  MEDICAL RECORD NO.:  [id number]  LOCATION:  5N20C                        FACILITY:  [facility]  PHYSICIAN:  Juley Giovanetti C. [name], M.D.    DATE OF BIRTH:  1951-04-22  DATE OF PROCEDURE:  05/20/2015 DATE OF DISCHARGE:                              OPERATIVE REPORT   PREOPERATIVE DIAGNOSIS:  Right knee osteoarthritis.  POSTOPERATIVE DIAGNOSIS:  Right knee osteoarthritis.  PROCEDURE:  Right cemented total knee arthroplasty, computer assist.  IMPLANTS:  #4 LCS femur with lugs cemented, #4 tibial component rotating platform, 10 mm poly, 35 mm 3-PEG patella.  SURGEON:  Tyronica Truxillo C. [name], M.D.  ASSISTANT:  Zonia Kief, PA-C, medically necessary and present for the entire procedure.  COMPLICATIONS:  None.  TOURNIQUET TIME:  1 hour 3 minutes.  DESCRIPTION OF PROCEDURE:  Measure apparent anesthesia put spinal, then jumping down after induction of spinal anesthesia, proximal leg holder. Lateral post heel bump were used, DuraPrep, time-out procedure, Ancef prophylaxis.  Usual extremity sheets drapes, impervious stockinette, Coban, Betadine, Steri-Drape was used to seal the skin, after midline incision was marked.  Leg was wrapped in Esmarch, tourniquet inflated. Midline incision was made.  Medial parapatellar arthrotomy was performed.  Patella was cut, removing 10 mm of bone.  Large spurs removed off the femur.  Pins were placed inside the incision on the femur.  Stab incision, mid tibia and the computer models were made.  A 10 mm was taken off the distal femur, 9.8 on the tibia.  Chamfer cuts were made.  Verified all cuts were within a millimeter.  Very large spurs removed off the medial tibial edge.  Posterior spurs removed 3 cortical curved osteotome, keel preparation pulsatile lavage.  Trials were inserted with which showed good fit and a symmetric flexion extension gaps.  Knee reached full extension.  There was excellent flexion.  No  releases were necessary due to the adequate cuts and the correction of the 9.7 degrees flexion contracture and 7 degrees varus. Patella tracking was good.  Pulse lavage again back commencing mixing of the cement, cementing the tibia followed by the femur insertion and the final rotating platform 10 mm bearing 35 mm poly and holding the cement until the cement was hard at 15 minutes.  All excessive cement had been removed.  Pins for the computer removed when 15 minutes was up, the cement was hard.  The tourniquet was deflated.  Hemostasis obtained and then standard layered closure with nonabsorbable in the deep retinaculum 2-0 Vicryl superficial retinaculum subcuticular closure, postop dressing, knee immobilizer.  The patient tolerated the procedure well.  Transferred to recovery room in stable condition.     Laura Allen, M.D.     MCY/MEDQ  D:  05/20/2015  T:  05/21/2015  Job:  [id number]

## 2015-05-21 NOTE — Progress Notes (Signed)
Orthopedic Tech Progress Note Patient Details:  Laura Allen Feb 04, 1951 [id number] On cpm at 8:30 pm Patient ID: Laura Allen, female   DOB: 1951-04-23, 64 y.o.   MRN: [id number]   Laura Allen 05/21/2015, 8:30 PM

## 2015-05-22 LAB — CBC
HEMATOCRIT: 34.3 % — AB (ref 36.0–46.0)
Hemoglobin: 11.1 g/dL — ABNORMAL LOW (ref 12.0–15.0)
MCH: 27.9 pg (ref 26.0–34.0)
MCHC: 32.4 g/dL (ref 30.0–36.0)
MCV: 86.2 fL (ref 78.0–100.0)
Platelets: 240 10*3/uL (ref 150–400)
RBC: 3.98 MIL/uL (ref 3.87–5.11)
RDW: 14.1 % (ref 11.5–15.5)
WBC: 16.2 10*3/uL — AB (ref 4.0–10.5)

## 2015-05-22 MED ORDER — OXYCODONE-ACETAMINOPHEN 5-325 MG PO TABS: 1.0000 | ORAL_TABLET | Freq: Four times a day (QID) | ORAL | Status: DC | PRN

## 2015-05-22 MED ORDER — METHOCARBAMOL 500 MG PO TABS
500.0000 mg | ORAL_TABLET | Freq: Four times a day (QID) | ORAL | Status: DC
Start: 2015-05-22 — End: 2016-11-27

## 2015-05-22 MED ORDER — ASPIRIN EC 325 MG PO TBEC: 325.0000 mg | DELAYED_RELEASE_TABLET | Freq: Every day | ORAL | Status: DC

## 2015-05-22 NOTE — Progress Notes (Signed)
Physical Therapy Treatment Patient Details Name: Laura Allen MRN: [id number] DOB: 12/16/1950 Today's Date: 05/22/2015    History of Present Illness Rt TKA    PT Comments    Patient is making good progress with PT.  From a mobility standpoint anticipate patient will be ready for DC home with Home Health follow-up. Patient denies any questions or concerns.      Follow Up Recommendations  Home health PT     Equipment Recommendations  None recommended by PT    Recommendations for Other Services       Precautions / Restrictions Precautions Precautions: Fall;Knee Precaution Comments: Reviewed HEP Restrictions Weight Bearing Restrictions: Yes RLE Weight Bearing: Weight bearing as tolerated    Mobility  Bed Mobility                  Transfers Overall transfer level: Needs assistance Equipment used: Rolling walker (2 wheeled) Transfers: Sit to/from Stand Sit to Stand: Modified independent (Device/Increase time)            Ambulation/Gait Ambulation/Gait assistance: Supervision Ambulation Distance (Feet): 70 Feet Assistive device: Rolling walker (2 wheeled) Gait Pattern/deviations: Step-to pattern   Gait velocity interpretation: Below normal speed for age/gender     Stairs         General stair comments: Patient reporting that she does not have any stairs at home and does not feel like she needes to attempt them. We did review stair technique incase she had to attempt them in the community. Patient verbalized understanding and again declined physically attempting  Wheelchair Mobility    Modified Rankin (Stroke Patients Only)       Balance Overall balance assessment: Needs assistance Sitting-balance support: No upper extremity supported Sitting balance-Leahy Scale: Good     Standing balance support: Bilateral upper extremity supported Standing balance-Leahy Scale: Poor                      Cognition Arousal/Alertness:  Awake/alert Behavior During Therapy: WFL for tasks assessed/performed Overall Cognitive Status: Within Functional Limits for tasks assessed                      Exercises Total Joint Exercises Ankle Circles/Pumps: AROM;Both;15 reps;Supine Heel Slides: Right;AAROM;10 reps;Seated Goniometric ROM: 7-78 Other Exercises Other Exercises: extension hang, performed and reviewed how to perform at home.  Other Exercises: Patient has copy of TKA HEP for reference    General Comments        Pertinent Vitals/Pain Pain Assessment: 0-10 Pain Score: 4  Pain Location: Rt knee Pain Descriptors / Indicators: Sore Pain Intervention(s): Monitored during session    Home Living                      Prior Function            PT Goals (current goals can now be found in the care plan section) Acute Rehab PT Goals Patient Stated Goal: go home today PT Goal Formulation: With patient Time For Goal Achievement: 06/04/15 Potential to Achieve Goals: Good Progress towards PT goals: Progressing toward goals    Frequency  7X/week    PT Plan Current plan remains appropriate    Co-evaluation             End of Session Equipment Utilized During Treatment: Gait belt Activity Tolerance: Patient tolerated treatment well Patient left: in bed;with call bell/phone within reach     Time: 1062-6948 PT Time Calculation (min) (  ACUTE ONLY): 26 min  Charges:  $Gait Training: 8-22 mins $Therapeutic Exercise: 8-22 mins                    G Codes:      Christiane Ha, PT, CSCS Pager [phone] Office [phone]  05/22/2015, 10:57 AM

## 2015-05-22 NOTE — Progress Notes (Signed)
Subjective: 2 Days Post-Op Procedure(s) (LRB): COMPUTER ASSISTED TOTAL KNEE ARTHROPLASTY (Right) Patient reports pain as mild.    Objective: Vital signs in last 24 hours: Temp:  [97.8 F (36.6 C)-98.8 F (37.1 C)] 97.8 F (36.6 C) (07/13 0622) Pulse Rate:  [68-96] 91 (07/13 0622) Resp:  [16] 16 (07/13 0622) BP: (109-116)/(51-66) 109/51 mmHg (07/13 0622) SpO2:  [91 %-96 %] 91 % (07/13 0622)  Intake/Output from previous day: 07/12 0701 - 07/13 0700 In: 1480.2 [P.O.:480; I.V.:1000.2] Out: -  Intake/Output this shift:     Recent Labs  05/21/15 0424 05/22/15 0401  HGB 11.9* 11.1*    Recent Labs  05/21/15 0424 05/22/15 0401  WBC 14.1* 16.2*  RBC 4.14 3.98  HCT 36.3 34.3*  PLT 255 240    Recent Labs  05/21/15 0424  NA 134*  K 3.6  CL 98*  CO2 28  BUN 15  CREATININE 0.82  GLUCOSE 134*  CALCIUM 8.7*   No results for input(s): LABPT, INR in the last 72 hours.  Neurologically intact  Assessment/Plan: 2 Days Post-Op Procedure(s) (LRB): COMPUTER ASSISTED TOTAL KNEE ARTHROPLASTY (Right) Up with therapy walked in the hall. Likely stairs today then home this afternoon.   Tramain Gershman C 05/22/2015, 7:25 AM

## 2015-05-22 NOTE — Progress Notes (Signed)
Pt being discharged home via wheelchair with family. Pt alert and oriented x4. VSS. Pt c/o no pain at this time. No signs of respiratory distress. Education complete and care plans resolved. IV removed with catheter intact and pt tolerated well. No further issues at this time. Pt to follow up with PCP. Angalena Cousineau R, RN 

## 2015-05-22 NOTE — Progress Notes (Signed)
Occupational Therapy Treatment Patient Details Name: Laura Allen MRN: [id number] DOB: 1951-10-01 Today's Date: 05/22/2015    History of present illness Rt TKA   OT comments  Pt progressing towards acute OT goals. Focus of session was toilet transfers and cm/h and LB dressing. D/c plan reamins appropriate.   Follow Up Recommendations  Supervision/Assistance - 24 hour;No OT follow up    Equipment Recommendations  None recommended by OT    Recommendations for Other Services      Precautions / Restrictions Precautions Precautions: Fall;Knee Precaution Booklet Issued: Yes (comment) Precaution Comments: reviewed Restrictions Weight Bearing Restrictions: Yes RLE Weight Bearing: Weight bearing as tolerated       Mobility Bed Mobility                  Transfers Overall transfer level: Needs assistance Equipment used: Rolling walker (2 wheeled) Transfers: Sit to/from Stand Sit to Stand: Supervision              Balance Overall balance assessment: Needs assistance Sitting-balance support: No upper extremity supported Sitting balance-Leahy Scale: Good     Standing balance support: Bilateral upper extremity supported;During functional activity Standing balance-Leahy Scale: Poor Standing balance comment: rw for balance                   ADL Overall ADL's : Needs assistance/impaired                 Upper Body Dressing : Set up;Sitting   Lower Body Dressing: Min guard;Sit to/from stand   Toilet Transfer: Min guard;Ambulation;RW (3n1 over toilet)   Toileting- Clothing Manipulation and Hygiene: Min guard;Sitting/lateral lean Toileting - Clothing Manipulation Details (indicate cue type and reason): Instructed on compensatory techniques for pericare     Functional mobility during ADLs: Min guard;Rolling walker General ADL Comments: Pt completed ADLs asa detailed above. Reviewed ADL education.      Vision                      Perception     Praxis      Cognition   Behavior During Therapy: WFL for tasks assessed/performed Overall Cognitive Status: Within Functional Limits for tasks assessed                       Extremity/Trunk Assessment               Exercises    Shoulder Instructions       General Comments      Pertinent Vitals/ Pain       Pain Assessment: 0-10 Pain Score: 6  Pain Location: Rt knee Pain Descriptors / Indicators: Sore Pain Intervention(s): Limited activity within patient's tolerance;Monitored during session;Repositioned;Premedicated before session  Home Living                                          Prior Functioning/Environment              Frequency Min 2X/week     Progress Toward Goals  OT Goals(current goals can now be found in the care plan section)  Progress towards OT goals: Progressing toward goals  Acute Rehab OT Goals Patient Stated Goal: go home today OT Goal Formulation: With patient Time For Goal Achievement: 05/28/15 Potential to Achieve Goals: Good ADL Goals Pt Will Perform Grooming: with min guard assist;standing Pt  Will Perform Lower Body Bathing: with min guard assist;sit to/from stand Pt Will Perform Lower Body Dressing: with min guard assist;sit to/from stand Pt Will Transfer to Toilet: with min guard assist;ambulating Pt Will Perform Toileting - Clothing Manipulation and hygiene: with min guard assist;sit to/from stand Additional ADL Goal #1: Pt will complete bed mobility at mod I level to prepare for OOB ADLs.  Plan Discharge plan remains appropriate    Co-evaluation                 End of Session Equipment Utilized During Treatment: Gait belt;Rolling walker   Activity Tolerance Patient limited by pain;Patient tolerated treatment well   Patient Left in chair;with call bell/phone within reach;with family/visitor present   Nurse Communication          Time: 1210-1222 OT Time Calculation  (min): 12 min  Charges: OT General Charges $OT Visit: 1 Procedure OT Treatments $Self Care/Home Management : 8-22 mins  Pilar Grammes 05/22/2015, 1:54 PM

## 2015-05-22 NOTE — Anesthesia Postprocedure Evaluation (Signed)
Anesthesia Post Note  Patient: Laura Allen  Procedure(s) Performed: Procedure(s) (LRB): COMPUTER ASSISTED TOTAL KNEE ARTHROPLASTY (Right)  Anesthesia type: Spinal  Patient location: PACU  Post pain: Pain level controlled  Post assessment: Post-op Vital signs reviewed  Last Vitals: BP 109/51 mmHg  Pulse 91  Temp(Src) 36.6 C (Oral)  Resp 16  Ht 5\' 7"  (1.702 m)  Wt 226 lb (102.513 kg)  BMI 35.39 kg/m2  SpO2 91%  Post vital signs: Reviewed  Level of consciousness: sedated  Complications: No apparent anesthesia complications

## 2015-06-11 NOTE — Discharge Summary (Signed)
Patient ID: Laura Allen MRN: [id number] DOB/AGE: 63-Oct-1952 63 y.o.  Admit date: 05/20/2015 Discharge date: 06/11/2015  Admission Diagnoses:  Active Problems:   Status post total right knee replacement   Discharge Diagnoses:  Active Problems:   Status post total right knee replacement  status post Procedure(s): COMPUTER ASSISTED TOTAL KNEE ARTHROPLASTY  Past Medical History  Diagnosis Date  . History of underactive thyroid   . Rheumatoid arthritis(714.0)   . Hypertension   . Hypothyroidism   . H/O seasonal allergies   . Osteoarthritis (arthritis due to wear and tear of joints)   . H/O exercise stress test     [facility]    Surgeries: Procedure(s): COMPUTER ASSISTED TOTAL KNEE ARTHROPLASTY on 05/20/2015   Consultants:    Discharged Condition: Improved  Hospital Course: Laura Allen is an 64 y.o. female who was admitted 05/20/2015 for operative treatment of knee djd.  Patient failed conservative treatments (please see the history and physical for the specifics) and had severe unremitting pain that affects sleep, daily activities and work/hobbies. After pre-op clearance, the patient was taken to the operating room on 05/20/2015 and underwent  Procedure(s): COMPUTER ASSISTED TOTAL KNEE ARTHROPLASTY.    Patient was given perioperative antibiotics:  Anti-infectives    Start     Dose/Rate Route Frequency Ordered Stop   05/20/15 1044  ceFAZolin (ANCEF) 2-3 GM-% IVPB SOLR    Comments:  Shireen Quan   : cabinet override      05/20/15 1044 05/20/15 1340   05/20/15 1035  ceFAZolin (ANCEF) IVPB 2 g/50 mL premix  Status:  Discontinued     2 g 100 mL/hr over 30 Minutes Intravenous On call to O.R. 05/20/15 1035 05/20/15 1719       Patient was given sequential compression devices and early ambulation to prevent DVT.   Patient benefited maximally from hospital stay and there were no complications. At the time of discharge, the patient was urinating/moving  their bowels without difficulty, tolerating a regular diet, pain is controlled with oral pain medications and they have been cleared by PT/OT.   Recent vital signs: No data found.    Recent laboratory studies: No results for input(s): WBC, HGB, HCT, PLT, NA, K, CL, CO2, BUN, CREATININE, GLUCOSE, INR, CALCIUM in the last 72 hours.  Invalid input(s): PT, 2   Discharge Medications:     Medication List    STOP taking these medications        acetaminophen 500 MG tablet  Commonly known as:  TYLENOL      TAKE these medications        aspirin EC 325 MG tablet  Take 1 tablet (325 mg total) by mouth daily.     calcium carbonate 600 MG Tabs tablet  Commonly known as:  OS-CAL  Take 600 mg by mouth daily.     cholecalciferol 1000 UNITS tablet  Commonly known as:  VITAMIN D  Take 1,000 Units by mouth daily.     diltiazem 120 MG 12 hr capsule  Commonly known as:  CARDIZEM SR  Take 120 mg by mouth daily.     folic acid 1 MG tablet  Commonly known as:  FOLVITE  Take 1 mg by mouth daily.     HUMIRA 40 MG/0.8ML injection  Generic drug:  adalimumab  Inject 40 mg into the skin every 14 (fourteen) days.     hydrochlorothiazide 25 MG tablet  Commonly known as:  HYDRODIURIL  Take 25 mg by mouth daily  as needed (swelling). For fluid     levothyroxine 88 MCG tablet  Commonly known as:  SYNTHROID, LEVOTHROID  Take 88 mcg by mouth every morning.     loratadine 10 MG tablet  Commonly known as:  CLARITIN  Take 10 mg by mouth daily as needed for allergies.     methocarbamol 500 MG tablet  Commonly known as:  ROBAXIN  Take 1 tablet (500 mg total) by mouth 4 (four) times daily.     methotrexate 2.5 MG tablet  Commonly known as:  RHEUMATREX  Take 12.5 mg by mouth 2 (two) times a week. Takes on Thursdays and Fridays--Caution:Chemotherapy. Protect from light.     oxyCODONE-acetaminophen 5-325 MG per tablet  Commonly known as:  ROXICET  Take 1-2 tablets by mouth every 6 (six) hours as  needed for severe pain.     POTASSIUM PO  Take 1 tablet by mouth daily.     predniSONE 5 MG tablet  Commonly known as:  DELTASONE  Take 5 mg by mouth daily as needed (for rheumatoid arthritis flare ups). For rheumatoid arthritis     traMADol 50 MG tablet  Commonly known as:  ULTRAM  Take 50 mg by mouth every 6 (six) hours as needed for moderate pain.        Diagnostic Studies: No results found.      Discharge Plan:  discharge to home  Disposition:     Signed: Naida Sleight  06/11/2015, 1:36 PM

## 2015-09-11 ENCOUNTER — Encounter: Payer: Self-pay | Admitting: "Endocrinology

## 2015-09-11 ENCOUNTER — Ambulatory Visit (INDEPENDENT_AMBULATORY_CARE_PROVIDER_SITE_OTHER): Payer: BC Managed Care – PPO | Admitting: "Endocrinology

## 2015-09-11 VITALS — BP 118/79 | HR 105 | Ht 67.0 in | Wt 214.0 lb

## 2015-09-11 DIAGNOSIS — E039 Hypothyroidism, unspecified: Secondary | ICD-10-CM | POA: Diagnosis not present

## 2015-09-11 MED ORDER — LEVOTHYROXINE SODIUM 88 MCG PO TABS: 88.0000 ug | ORAL_TABLET | Freq: Every morning | ORAL | Status: DC

## 2015-09-11 NOTE — Progress Notes (Signed)
HPI  Laura Allen is a 64 y.o.-year-old female, here to follow up for her hypothyroidism.  She is on levothyroxine 88 g, by mouth every morning. She is compliant. No new complaints.  She has lost 10 pounds since last visit .She reports to have been diagnosed with hypothyroidism x 7 yrs ago and has been on thyroid hormone as long. I reviewed pt's thyroid tests: Consistent with appropriate replacement. She denies cold intolerance, heat intolerance. No h/o radiation tx to head or neck.   ROS: Constitutional: +weight  Loss. - no subjective hyperthermia/hypothermia Eyes: no blurry vision, no xerophthalmia ENT: no sore throat, no nodules palpated in throat, no dysphagia/odynophagia, no hoarseness Cardiovascular: no CP/SOB/palpitations/leg swelling Respiratory: no cough/SOB Gastrointestinal: no N/V/D/C Musculoskeletal: no muscle/joint aches Skin: no rashes Neurological: no tremors/numbness/tingling/dizziness Psychiatric: no depression/anxiety  PE: BP 118/79 mmHg  Pulse 105  Ht 5\' 7"  (1.702 m)  Wt 214 lb (97.07 kg)  BMI 33.51 kg/m2  SpO2 99% Wt Readings from Last 3 Encounters:  09/11/15 214 lb (97.07 kg)  05/20/15 226 lb (102.513 kg)  05/10/15 226 lb 8 oz (102.74 kg)   Constitutional: overweight, in NAD Eyes: PERRLA, EOMI, no exophthalmos ENT: moist mucous membranes, no thyromegaly, no cervical lymphadenopathy Cardiovascular: RRR, No MRG Respiratory: CTA B Gastrointestinal: abdomen soft, NT, ND, BS+ Musculoskeletal: no deformities, strength intact in all 4 Skin: moist, warm, no rashes Neurological: no tremor with outstretched hands, DTR normal in all 4  ASSESSMENT: 1. Hypothyroidism  PLAN:    Patient with long-standing hypothyroidism, on levothyroxine therapy. Patient appears euthyroid.  I would continue on the same dose of levothyroxine 88 g by mouth every morning. - We discussed about correct intake of levothyroxine, at fasting, with water, separated by at least  30 minutes from breakfast, and separated by more than 4 hours from calcium, iron, multivitamins, acid reflux medications (PPIs). -Patient is made aware of the fact that thyroid hormone replacement is needed for life, dose to be adjusted by periodic monitoring of thyroid function tests. - Will check thyroid tests before next visit in 6 months: TSH, free T4  07/11/15, MD Phone: [phone]  Fax: [phone]   09/11/2015, 1:58 PM

## 2015-09-11 NOTE — Patient Instructions (Addendum)

## 2016-01-01 ENCOUNTER — Other Ambulatory Visit: Payer: Self-pay

## 2016-01-01 MED ORDER — LEVOTHYROXINE SODIUM 88 MCG PO TABS: 88.0000 ug | ORAL_TABLET | Freq: Every morning | ORAL | Status: DC

## 2016-02-24 ENCOUNTER — Other Ambulatory Visit: Payer: Self-pay | Admitting: "Endocrinology

## 2016-03-12 ENCOUNTER — Other Ambulatory Visit: Payer: Self-pay | Admitting: "Endocrinology

## 2016-03-12 ENCOUNTER — Ambulatory Visit: Payer: BC Managed Care – PPO | Admitting: "Endocrinology

## 2016-03-12 LAB — T4, FREE: Free T4: 1.3 ng/dL (ref 0.8–1.8)

## 2016-03-12 LAB — TSH: TSH: 2.48 mIU/L

## 2016-03-19 ENCOUNTER — Ambulatory Visit (INDEPENDENT_AMBULATORY_CARE_PROVIDER_SITE_OTHER): Payer: BC Managed Care – PPO | Admitting: "Endocrinology

## 2016-03-19 ENCOUNTER — Encounter: Payer: Self-pay | Admitting: "Endocrinology

## 2016-03-19 VITALS — BP 107/76 | HR 93 | Ht 67.0 in | Wt 220.0 lb

## 2016-03-19 DIAGNOSIS — E042 Nontoxic multinodular goiter: Secondary | ICD-10-CM | POA: Diagnosis not present

## 2016-03-19 DIAGNOSIS — E039 Hypothyroidism, unspecified: Secondary | ICD-10-CM | POA: Diagnosis not present

## 2016-03-19 MED ORDER — LEVOTHYROXINE SODIUM 88 MCG PO TABS: 88.0000 ug | ORAL_TABLET | Freq: Every morning | ORAL | Status: DC

## 2016-03-19 NOTE — Progress Notes (Signed)
HPI  Laura Allen is a 65 y.o.-year-old female, here to follow up for her hypothyroidism.  She is on levothyroxine 88 g, by mouth every morning. She is compliant. No new complaints.  She has steady weight since one year ago. She reports to have been diagnosed with hypothyroidism x 7 yrs ago and has been on thyroid hormone as long. I reviewed pt's thyroid tests: Consistent with appropriate replacement. She denies cold intolerance, heat intolerance. No h/o radiation tx to head or neck. She has had multiple bilateral subcentimeter nodules and ultrasound study of the thyroid in 2011.   ROS: Constitutional: +weight  Loss. - no subjective hyperthermia/hypothermia Eyes: no blurry vision, no xerophthalmia ENT: no sore throat, no nodules palpated in throat, no dysphagia/odynophagia, no hoarseness Cardiovascular: no CP/SOB/palpitations/leg swelling Respiratory: no cough/SOB Gastrointestinal: no N/V/D/C Musculoskeletal: no muscle/joint aches Skin: no rashes Neurological: no tremors/numbness/tingling/dizziness Psychiatric: no depression/anxiety  PE: BP 107/76 mmHg  Pulse 93  Ht 5\' 7"  (1.702 m)  Wt 220 lb (99.791 kg)  BMI 34.45 kg/m2  SpO2 97% Wt Readings from Last 3 Encounters:  03/19/16 220 lb (99.791 kg)  09/11/15 214 lb (97.07 kg)  05/20/15 226 lb (102.513 kg)   Constitutional: overweight, in NAD Eyes: PERRLA, EOMI, no exophthalmos ENT: moist mucous membranes, no thyromegaly, no cervical lymphadenopathy Cardiovascular: RRR, No MRG Respiratory: CTA B Gastrointestinal: abdomen soft, NT, ND, BS+ Musculoskeletal: no deformities, strength intact in all 4 Skin: moist, warm, no rashes Neurological: no tremor with outstretched hands, DTR normal in all 4  ASSESSMENT: 1. Hypothyroidism 2. Multinodular goiter  PLAN:    Patient with long-standing hypothyroidism, on levothyroxine therapy.  -Her thyroid function tests are consistent with appropriate replacement. I will continue  on the same dose of levothyroxine 88 g by mouth every morning. - We discussed about correct intake of levothyroxine, at fasting, with water, separated by at least 30 minutes from breakfast, and separated by more than 4 hours from calcium, iron, multivitamins, acid reflux medications (PPIs). -Patient is made aware of the fact that thyroid hormone replacement is needed for life, dose to be adjusted by periodic monitoring of thyroid function tests. - Will check thyroid tests before next visit in 6 months: TSH, free T4. -I will obtain repeat thyroid ultrasound before her next visit to monitor nodular growth from her last study in 2011.  2012, MD Phone: [phone]  Fax: [phone]   03/19/2016, 10:13 AM

## 2016-04-22 ENCOUNTER — Ambulatory Visit (HOSPITAL_COMMUNITY): Admission: RE | Admit: 2016-04-22 | Payer: BC Managed Care – PPO | Source: Ambulatory Visit

## 2016-05-14 ENCOUNTER — Ambulatory Visit (HOSPITAL_COMMUNITY): Payer: BC Managed Care – PPO

## 2016-05-27 ENCOUNTER — Other Ambulatory Visit: Payer: Self-pay | Admitting: "Endocrinology

## 2016-06-30 ENCOUNTER — Encounter (HOSPITAL_COMMUNITY): Payer: Self-pay

## 2016-06-30 ENCOUNTER — Emergency Department (HOSPITAL_COMMUNITY): Payer: BC Managed Care – PPO

## 2016-06-30 ENCOUNTER — Emergency Department (HOSPITAL_COMMUNITY)
Admission: EM | Admit: 2016-06-30 | Discharge: 2016-06-30 | Disposition: A | Discharge status: Home / Self Care | Payer: BC Managed Care – PPO | Attending: Emergency Medicine | Admitting: Emergency Medicine

## 2016-06-30 DIAGNOSIS — T148XXA Other injury of unspecified body region, initial encounter: Secondary | ICD-10-CM

## 2016-06-30 DIAGNOSIS — S5002XA Contusion of left elbow, initial encounter: Secondary | ICD-10-CM | POA: Insufficient documentation

## 2016-06-30 DIAGNOSIS — E039 Hypothyroidism, unspecified: Secondary | ICD-10-CM | POA: Diagnosis not present

## 2016-06-30 DIAGNOSIS — Z7982 Long term (current) use of aspirin: Secondary | ICD-10-CM | POA: Diagnosis not present

## 2016-06-30 DIAGNOSIS — W19XXXA Unspecified fall, initial encounter: Secondary | ICD-10-CM

## 2016-06-30 DIAGNOSIS — Z79899 Other long term (current) drug therapy: Secondary | ICD-10-CM | POA: Diagnosis not present

## 2016-06-30 DIAGNOSIS — S022XXA Fracture of nasal bones, initial encounter for closed fracture: Secondary | ICD-10-CM | POA: Diagnosis not present

## 2016-06-30 DIAGNOSIS — S0232XA Fracture of orbital floor, left side, initial encounter for closed fracture: Secondary | ICD-10-CM | POA: Insufficient documentation

## 2016-06-30 DIAGNOSIS — W01198A Fall on same level from slipping, tripping and stumbling with subsequent striking against other object, initial encounter: Secondary | ICD-10-CM | POA: Diagnosis not present

## 2016-06-30 DIAGNOSIS — S0285XA Fracture of orbit, unspecified, initial encounter for closed fracture: Secondary | ICD-10-CM

## 2016-06-30 DIAGNOSIS — Y92009 Unspecified place in unspecified non-institutional (private) residence as the place of occurrence of the external cause: Secondary | ICD-10-CM | POA: Diagnosis not present

## 2016-06-30 DIAGNOSIS — Y93H2 Activity, gardening and landscaping: Secondary | ICD-10-CM | POA: Insufficient documentation

## 2016-06-30 DIAGNOSIS — S0992XA Unspecified injury of nose, initial encounter: Secondary | ICD-10-CM | POA: Diagnosis present

## 2016-06-30 DIAGNOSIS — I10 Essential (primary) hypertension: Secondary | ICD-10-CM | POA: Insufficient documentation

## 2016-06-30 DIAGNOSIS — S0280XA Fracture of other specified skull and facial bones, unspecified side, initial encounter for closed fracture: Secondary | ICD-10-CM

## 2016-06-30 DIAGNOSIS — Y999 Unspecified external cause status: Secondary | ICD-10-CM | POA: Insufficient documentation

## 2016-06-30 MED ORDER — BACITRACIN ZINC 500 UNIT/GM EX OINT
TOPICAL_OINTMENT | Freq: Once | CUTANEOUS | Status: AC
  Administered 2016-06-30: 1 via TOPICAL
  Filled 2016-06-30: qty 0.9

## 2016-06-30 NOTE — Discharge Instructions (Signed)
You were seen today after a fall.  You have a nasal bone fracture and a possible orbital wall fracture.  You will be very sore tomorrow.  Use ice as needed.  After swelling improves if you have persistent nasal bone deformity or any concerns, follow-up with ENT as given above.

## 2016-06-30 NOTE — ED Provider Notes (Signed)
[facility] Provider Note   CSN: [id number] Arrival date & time: 06/30/16  2035  By signing my name below, I, Vista Mink, attest that this documentation has been prepared under the direction and in the presence of Shon Baton, MD. Electronically signed, Vista Mink, ED Scribe. 06/30/16. 11:18 PM.  History   Chief Complaint Chief Complaint  Patient presents with  . Fall    HPI The history is provided by the patient. No language interpreter was used.   HPI Comments: Laura Allen is a 65 y.o. female with a PMHx of RA, who presents to the Emergency Department s/p a mechanical fall that occurred approximately 4 hours ago. Pt states that she was ambulating down the walkway in front of her house when she tripped over a garden hose. Pt states she landed on her right wrist, bilateral knees and struck her face on the ground. Pt denies any bleeding other than superficial abrasion to the bridge of her nose. Pt currently describes 3/10 pain. Pt states she is able to ambulate without difficulty. Pt has right knee replacement but denies any acute knee pain. She denies any LOC. Tetanus is UTD. Denies any blood thinners.   Past Medical History:  Diagnosis Date  . H/O exercise stress test    [facility]  . H/O seasonal allergies   . History of underactive thyroid   . Hypertension   . Hypothyroidism   . Osteoarthritis (arthritis due to wear and tear of joints)   . Rheumatoid arthritis(714.0)     Patient Active Problem List   Diagnosis Date Noted  . Nontoxic multinodular goiter 03/19/2016  . Primary hypothyroidism 09/11/2015  . Status post total right knee replacement 05/20/2015  . Rheumatic joint disease 12/03/2011  . OA (osteoarthritis) of knee 12/03/2011    Past Surgical History:  Procedure Laterality Date  . CATARACT EXTRACTION W/PHACO Left 09/04/2013   Procedure: CATARACT EXTRACTION PHACO AND INTRAOCULAR LENS PLACEMENT (IOC);  Surgeon: Gemma Payor, MD;   Location: [facility];  Service: Ophthalmology;  Laterality: Left;  CDE:  10.22  . CATARACT EXTRACTION W/PHACO Right 09/14/2013   Procedure: CATARACT EXTRACTION PHACO AND INTRAOCULAR LENS PLACEMENT (IOC);  Surgeon: Gemma Payor, MD;  Location: [facility];  Service: Ophthalmology;  Laterality: Right;  CDE:8.81  . CHOLECYSTECTOMY    . COLONOSCOPY    . KNEE ARTHROPLASTY Right 05/20/2015   Procedure: COMPUTER ASSISTED TOTAL KNEE ARTHROPLASTY;  Surgeon: Eldred Manges, MD;  Location: [facility] OR;  Service: Orthopedics;  Laterality: Right;  . OVARIAN CYST SURGERY      OB History    No data available       Home Medications    Prior to Admission medications   Medication Sig Start Date End Date Taking? Authorizing Provider  adalimumab (HUMIRA) 40 MG/0.8ML injection Inject 40 mg into the skin every 14 (fourteen) days.    Historical Provider, MD  aspirin EC 325 MG tablet Take 1 tablet (325 mg total) by mouth daily. 05/22/15   Naida Sleight, PA-C  calcium carbonate (OS-CAL) 600 MG TABS tablet Take 600 mg by mouth daily.    Historical Provider, MD  cholecalciferol (VITAMIN D) 1000 UNITS tablet Take 1,000 Units by mouth daily.    Historical Provider, MD  diltiazem (CARDIZEM SR) 120 MG 12 hr capsule Take 120 mg by mouth daily. 04/15/15   Historical Provider, MD  folic acid (FOLVITE) 1 MG tablet Take 1 mg by mouth daily.    Historical Provider, MD  hydrochlorothiazide (HYDRODIURIL) 25  MG tablet Take 25 mg by mouth daily as needed (swelling). For fluid    Historical Provider, MD  levothyroxine (SYNTHROID, LEVOTHROID) 88 MCG tablet TAKE 1 TABLET BY MOUTH EVERY MORNING 05/27/16   Roma Kayser, MD  loratadine (CLARITIN) 10 MG tablet Take 10 mg by mouth daily as needed for allergies.    Historical Provider, MD  methocarbamol (ROBAXIN) 500 MG tablet Take 1 tablet (500 mg total) by mouth 4 (four) times daily. 05/22/15   Naida Sleight, PA-C  methotrexate (RHEUMATREX) 2.5 MG tablet Take 12.5 mg by mouth 2 (two) times a week.  Takes on Thursdays and Fridays--Caution:Chemotherapy. Protect from light.    Historical Provider, MD  oxyCODONE-acetaminophen (ROXICET) 5-325 MG per tablet Take 1-2 tablets by mouth every 6 (six) hours as needed for severe pain. 05/22/15   Naida Sleight, PA-C  POTASSIUM PO Take 1 tablet by mouth daily.    Historical Provider, MD  predniSONE (DELTASONE) 5 MG tablet Take 5 mg by mouth daily as needed (for rheumatoid arthritis flare ups). For rheumatoid arthritis    Historical Provider, MD  traMADol (ULTRAM) 50 MG tablet Take 50 mg by mouth every 6 (six) hours as needed for moderate pain.    Historical Provider, MD    Family History Family History  Problem Relation Age of Onset  . Arthritis    . Cancer    . Diabetes      Social History Social History  Substance Use Topics  . Smoking status: Never Smoker  . Smokeless tobacco: Never Used  . Alcohol use No     Allergies   Ibuprofen; Levofloxacin; and Streptomycin   Review of Systems Review of Systems  Constitutional: Negative for fever.  Respiratory: Negative for shortness of breath.   Cardiovascular: Negative for chest pain.  Musculoskeletal: Positive for arthralgias (mild pain to right wrist). Negative for gait problem.  Skin: Positive for color change (echymosis to nose, bilateral knees) and wound (bridge of nose).  Neurological: Negative for dizziness and syncope.  All other systems reviewed and are negative.    Physical Exam Updated Vital Signs BP (!) 118/54 (BP Location: Left Arm)   Pulse 82   Temp 98.9 F (37.2 C) (Oral)   Resp 18   Ht 5\' 7"  (1.702 m)   Wt 215 lb (97.5 kg)   SpO2 97%   BMI 33.67 kg/m   Physical Exam  Constitutional: She is oriented to person, place, and time. She appears well-developed and well-nourished. No distress.  HENT:  Head: Normocephalic.  Abrasion noted over the bridge of the nose with mild swelling, no septal hematoma noted, dried blood noticed in the left nare  Eyes: EOM are  normal. Pupils are equal, round, and reactive to light.  Neck: Normal range of motion. Neck supple.  No midline C-spine tenderness  Cardiovascular: Normal rate, regular rhythm and normal heart sounds.   Pulmonary/Chest: Effort normal and breath sounds normal. No respiratory distress. She has no wheezes.  Abdominal: Soft. Bowel sounds are normal. There is no tenderness. There is no guarding.  Musculoskeletal:  Tenderness to palpation over the thenar eminence of the right hand, normal range of motion of the wrist, no obvious deformities, normal range of motion of the bilateral knees, mild contusions noted bilaterally, there is also contusion noted over the left elbow, no effusion noted, normal range of motion at the elbow, 2+ radial pulses bilaterally  Neurological: She is alert and oriented to person, place, and time.  Skin: Skin is  warm and dry.  Psychiatric: She has a normal mood and affect.  Nursing note and vitals reviewed.    ED Treatments / Results  DIAGNOSTIC STUDIES: Oxygen Saturation is 100% on RA, normal by my interpretation.  COORDINATION OF CARE: 11:17 PM-Will discharge and discussed return precautions. Discussed treatment plan with pt at bedside and pt agreed to plan.   Labs (all labs ordered are listed, but only abnormal results are displayed) Labs Reviewed - No data to display  EKG  EKG Interpretation None       Radiology Dg Wrist Complete Right  Result Date: 06/30/2016 CLINICAL DATA:  Fall over garden nose with wrist pain. Initial encounter. EXAM: RIGHT WRIST - COMPLETE 3+ VIEW COMPARISON:  None. FINDINGS: No acute fracture or dislocation. Advanced radiocarpal osteoarthritis. Advanced intercarpal osteoarthritic narrowing, especially at the articulation of the capitate and lunate. Associated lunate spurring and sclerosis without visible fragmentation. Radial ulnar joint spurring. IMPRESSION: 1. No acute finding. 2. Advanced carpal, radiocarpal, and distal radial  ulnar osteoarthritis. Electronically Signed   By: Marnee Spring M.D.   On: 06/30/2016 21:34   Ct Head Wo Contrast  Result Date: 06/30/2016 CLINICAL DATA:  Trip and fall over water hose tonight striking face. Bruising about the nose and infraorbital region. Pain. EXAM: CT HEAD WITHOUT CONTRAST CT MAXILLOFACIAL WITHOUT CONTRAST CT CERVICAL SPINE WITHOUT CONTRAST TECHNIQUE: Multidetector CT imaging of the head, cervical spine, and maxillofacial structures were performed using the standard protocol without intravenous contrast. Multiplanar CT image reconstructions of the cervical spine and maxillofacial structures were also generated. COMPARISON:  None. FINDINGS: CT HEAD FINDINGS Brain: No intracranial hemorrhage, mass effect, or midline shift. No hydrocephalus. The basilar cisterns are patent. No evidence of territorial infarct. No intracranial fluid collection. Vascular: No hyperdense vessel or abnormal calcification. Skull: No fracture. Calvarium is intact. Mastoid air cells are clear. Sinuses/Orbits: Evaluated on dedicated face CT performed concurrently. CT MAXILLOFACIAL FINDINGS Bones: Minimally displaced left nasal bridge fracture. Associated soft tissue edema. Nasal septum is intact. Opacification of left ethmoid air cells adjacent to the medial left orbital wall with a questionable but not definitive nondisplaced fracture. Right orbit is intact. Both globes are normal. No retrobulbar soft tissue edema. The nasal bone, mandibles, zygomatic arches and pterygoid plates are intact. No paranasal sinus fluid levels. Paranasal sinuses are clear other than the left ethmoid air cell opacification. Degenerative change of the right greater than left are mandibular joints. Streak artifact from dental fillings. Soft tissues: Soft tissue edema about the nasal bridge. Minimal left periorbital edema. No radiopaque foreign body. CT CERVICAL SPINE FINDINGS No fracture or acute subluxation. The dens is intact. There are no  jumped or perched facets. Straightening of normal lordosis. Disc space narrowing from C4-C5 through C6-C7 with endplate spurring. There is minimal anterolisthesis of C3 on C4 and C7 on T1 likely secondary to degenerative facet arthropathy. Facet arthropathy is seen throughout. No prevertebral soft tissue edema. IMPRESSION: 1.  No acute intracranial abnormality.  No calvarial fracture. 2. Minimally displaced left nasal bone fracture. Questionable nondisplaced fracture of the medial wall of the left orbit. 3. Multilevel degenerative change in the cervical spine without acute fracture or subluxation. Electronically Signed   By: Rubye Oaks M.D.   On: 06/30/2016 22:04   Ct Cervical Spine Wo Contrast  Result Date: 06/30/2016 CLINICAL DATA:  Trip and fall over water hose tonight striking face. Bruising about the nose and infraorbital region. Pain. EXAM: CT HEAD WITHOUT CONTRAST CT MAXILLOFACIAL WITHOUT CONTRAST CT CERVICAL SPINE  WITHOUT CONTRAST TECHNIQUE: Multidetector CT imaging of the head, cervical spine, and maxillofacial structures were performed using the standard protocol without intravenous contrast. Multiplanar CT image reconstructions of the cervical spine and maxillofacial structures were also generated. COMPARISON:  None. FINDINGS: CT HEAD FINDINGS Brain: No intracranial hemorrhage, mass effect, or midline shift. No hydrocephalus. The basilar cisterns are patent. No evidence of territorial infarct. No intracranial fluid collection. Vascular: No hyperdense vessel or abnormal calcification. Skull: No fracture. Calvarium is intact. Mastoid air cells are clear. Sinuses/Orbits: Evaluated on dedicated face CT performed concurrently. CT MAXILLOFACIAL FINDINGS Bones: Minimally displaced left nasal bridge fracture. Associated soft tissue edema. Nasal septum is intact. Opacification of left ethmoid air cells adjacent to the medial left orbital wall with a questionable but not definitive nondisplaced fracture.  Right orbit is intact. Both globes are normal. No retrobulbar soft tissue edema. The nasal bone, mandibles, zygomatic arches and pterygoid plates are intact. No paranasal sinus fluid levels. Paranasal sinuses are clear other than the left ethmoid air cell opacification. Degenerative change of the right greater than left are mandibular joints. Streak artifact from dental fillings. Soft tissues: Soft tissue edema about the nasal bridge. Minimal left periorbital edema. No radiopaque foreign body. CT CERVICAL SPINE FINDINGS No fracture or acute subluxation. The dens is intact. There are no jumped or perched facets. Straightening of normal lordosis. Disc space narrowing from C4-C5 through C6-C7 with endplate spurring. There is minimal anterolisthesis of C3 on C4 and C7 on T1 likely secondary to degenerative facet arthropathy. Facet arthropathy is seen throughout. No prevertebral soft tissue edema. IMPRESSION: 1.  No acute intracranial abnormality.  No calvarial fracture. 2. Minimally displaced left nasal bone fracture. Questionable nondisplaced fracture of the medial wall of the left orbit. 3. Multilevel degenerative change in the cervical spine without acute fracture or subluxation. Electronically Signed   By: Rubye Oaks M.D.   On: 06/30/2016 22:04   Ct Maxillofacial Wo Contrast  Result Date: 06/30/2016 CLINICAL DATA:  Trip and fall over water hose tonight striking face. Bruising about the nose and infraorbital region. Pain. EXAM: CT HEAD WITHOUT CONTRAST CT MAXILLOFACIAL WITHOUT CONTRAST CT CERVICAL SPINE WITHOUT CONTRAST TECHNIQUE: Multidetector CT imaging of the head, cervical spine, and maxillofacial structures were performed using the standard protocol without intravenous contrast. Multiplanar CT image reconstructions of the cervical spine and maxillofacial structures were also generated. COMPARISON:  None. FINDINGS: CT HEAD FINDINGS Brain: No intracranial hemorrhage, mass effect, or midline shift. No  hydrocephalus. The basilar cisterns are patent. No evidence of territorial infarct. No intracranial fluid collection. Vascular: No hyperdense vessel or abnormal calcification. Skull: No fracture. Calvarium is intact. Mastoid air cells are clear. Sinuses/Orbits: Evaluated on dedicated face CT performed concurrently. CT MAXILLOFACIAL FINDINGS Bones: Minimally displaced left nasal bridge fracture. Associated soft tissue edema. Nasal septum is intact. Opacification of left ethmoid air cells adjacent to the medial left orbital wall with a questionable but not definitive nondisplaced fracture. Right orbit is intact. Both globes are normal. No retrobulbar soft tissue edema. The nasal bone, mandibles, zygomatic arches and pterygoid plates are intact. No paranasal sinus fluid levels. Paranasal sinuses are clear other than the left ethmoid air cell opacification. Degenerative change of the right greater than left are mandibular joints. Streak artifact from dental fillings. Soft tissues: Soft tissue edema about the nasal bridge. Minimal left periorbital edema. No radiopaque foreign body. CT CERVICAL SPINE FINDINGS No fracture or acute subluxation. The dens is intact. There are no jumped or perched facets. Straightening of  normal lordosis. Disc space narrowing from C4-C5 through C6-C7 with endplate spurring. There is minimal anterolisthesis of C3 on C4 and C7 on T1 likely secondary to degenerative facet arthropathy. Facet arthropathy is seen throughout. No prevertebral soft tissue edema. IMPRESSION: 1.  No acute intracranial abnormality.  No calvarial fracture. 2. Minimally displaced left nasal bone fracture. Questionable nondisplaced fracture of the medial wall of the left orbit. 3. Multilevel degenerative change in the cervical spine without acute fracture or subluxation. Electronically Signed   By: Rubye Oaks M.D.   On: 06/30/2016 22:04    Procedures Procedures (including critical care time)  Medications Ordered  in ED Medications  bacitracin ointment (not administered)     Initial Impression / Assessment and Plan / ED Course  I have reviewed the triage vital signs and the nursing notes.  Pertinent labs & imaging results that were available during my care of the patient were reviewed by me and considered in my medical decision making (see chart for details).  Clinical Course   Patient presents following a fall. She is nontoxic. ABCs intact. Abrasion of the nose. Imaging sent from triage reviewed. She has a nasal bone fracture as well as a possible medial orbital fracture on the left. Extraocular movements are intact. She does not appear entrapped. She has an abrasion over her nose but fractures are closed. She is declining x-rays of her knees. She has been ambulatory and states that she has pain at baseline. Discussed with patient supportive measures. She has pain medication at home.  She will be given ENT follow-up.  After history, exam, and medical workup I feel the patient has been appropriately medically screened and is safe for discharge home. Pertinent diagnoses were discussed with the patient. Patient was given return precautions.   Final Clinical Impressions(s) / ED Diagnoses   Final diagnoses:  Fall  Fall, initial encounter  Nasal bone fracture, closed, initial encounter  Orbital wall fracture, closed, initial encounter (HCC)  Contusion    New Prescriptions New Prescriptions   No medications on file   I personally performed the services described in this documentation, which was scribed in my presence. The recorded information has been reviewed and is accurate.     Shon Baton, MD 06/30/16 2337

## 2016-06-30 NOTE — ED Triage Notes (Signed)
Larey Seat today when I tripped over a water hose and landed on my hands, knees, and hit my face.  Laceration to bridge of nose. Bruising noted to nose and under left eye.

## 2016-08-17 ENCOUNTER — Other Ambulatory Visit (HOSPITAL_COMMUNITY): Payer: Self-pay | Admitting: Family Medicine

## 2016-08-17 DIAGNOSIS — Z1231 Encounter for screening mammogram for malignant neoplasm of breast: Secondary | ICD-10-CM

## 2016-08-20 ENCOUNTER — Ambulatory Visit (HOSPITAL_COMMUNITY)
Admission: RE | Admit: 2016-08-20 | Discharge: 2016-08-20 | Disposition: A | Discharge status: Home / Self Care | Payer: BC Managed Care – PPO | Source: Ambulatory Visit | Attending: Family Medicine | Admitting: Family Medicine

## 2016-08-20 DIAGNOSIS — Z1231 Encounter for screening mammogram for malignant neoplasm of breast: Secondary | ICD-10-CM | POA: Insufficient documentation

## 2016-08-25 ENCOUNTER — Other Ambulatory Visit: Payer: Self-pay | Admitting: "Endocrinology

## 2016-09-10 ENCOUNTER — Ambulatory Visit (HOSPITAL_COMMUNITY)
Admission: RE | Admit: 2016-09-10 | Discharge: 2016-09-10 | Disposition: A | Discharge status: Home / Self Care | Payer: BC Managed Care – PPO | Source: Ambulatory Visit | Attending: "Endocrinology | Admitting: "Endocrinology

## 2016-09-10 DIAGNOSIS — E039 Hypothyroidism, unspecified: Secondary | ICD-10-CM | POA: Diagnosis not present

## 2016-09-10 DIAGNOSIS — E042 Nontoxic multinodular goiter: Secondary | ICD-10-CM | POA: Insufficient documentation

## 2016-09-14 ENCOUNTER — Other Ambulatory Visit: Payer: Self-pay | Admitting: "Endocrinology

## 2016-09-14 LAB — TSH: TSH: 3.4 m[IU]/L

## 2016-09-14 LAB — T4, FREE: Free T4: 1.5 ng/dL (ref 0.8–1.8)

## 2016-09-21 ENCOUNTER — Encounter: Payer: Self-pay | Admitting: "Endocrinology

## 2016-09-21 ENCOUNTER — Ambulatory Visit (INDEPENDENT_AMBULATORY_CARE_PROVIDER_SITE_OTHER): Payer: BC Managed Care – PPO | Admitting: "Endocrinology

## 2016-09-21 VITALS — BP 124/84 | HR 87 | Ht 67.0 in | Wt 225.0 lb

## 2016-09-21 DIAGNOSIS — E042 Nontoxic multinodular goiter: Secondary | ICD-10-CM | POA: Diagnosis not present

## 2016-09-21 DIAGNOSIS — E039 Hypothyroidism, unspecified: Secondary | ICD-10-CM

## 2016-09-21 MED ORDER — LEVOTHYROXINE SODIUM 100 MCG PO TABS: 100.0000 ug | ORAL_TABLET | Freq: Every day | ORAL | 6 refills | Status: DC

## 2016-09-21 NOTE — Progress Notes (Signed)
HPI  Laura Allen is a 65 y.o.-year-old female, here to follow up for her hypothyroidism.  She is on levothyroxine 88 g, by mouth every morning. She is compliant. No new complaints.  She has steady weight gain of  10 lbs since august 2017.  She reports to have been diagnosed with hypothyroidism at age 89 yrs  and has been on thyroid hormone ever since. I reviewed pt's thyroid tests: Consistent with appropriate replacement. She denies cold intolerance, heat intolerance. No h/o radiation tx to head or neck. She has had multiple bilateral subcentimeter nodules and ultrasound study of the thyroid in 2011.   ROS: Constitutional: +weight  Gain. - no subjective hyperthermia/hypothermia Eyes: no blurry vision, no xerophthalmia ENT: no sore throat, no nodules palpated in throat, no dysphagia/odynophagia, no hoarseness Cardiovascular: no CP/SOB/palpitations/leg swelling Respiratory: no cough/SOB Gastrointestinal: no N/V/D/C Musculoskeletal: no muscle/joint aches Skin: no rashes Neurological: no tremors/numbness/tingling/dizziness Psychiatric: no depression/anxiety  PE: BP 124/84   Pulse 87   Ht 5\' 7"  (1.702 m)   Wt 225 lb (102.1 kg)   BMI 35.24 kg/m  Wt Readings from Last 3 Encounters:  09/21/16 225 lb (102.1 kg)  06/30/16 215 lb (97.5 kg)  03/19/16 220 lb (99.8 kg)   Constitutional: overweight, in NAD Eyes: PERRLA, EOMI, no exophthalmos ENT: moist mucous membranes, no thyromegaly, no cervical lymphadenopathy Cardiovascular: RRR, No MRG Respiratory: CTA B Gastrointestinal: abdomen soft, NT, ND, BS+ Musculoskeletal: no deformities, strength intact in all 4 Skin: moist, warm, no rashes Neurological: no tremor with outstretched hands, DTR normal in all 4  ASSESSMENT: 1. Hypothyroidism 2. Multinodular goiter  PLAN:    Patient with long-standing hypothyroidism, on levothyroxine therapy.  -Her thyroid function tests are consistent with appropriate replacement. However she  would benefit from slight increase in her levothyroxine. I will increase her levothyroxine to 100 g by mouth every morning.  - We discussed about correct intake of levothyroxine, at fasting, with water, separated by at least 30 minutes from breakfast, and separated by more than 4 hours from calcium, iron, multivitamins, acid reflux medications (PPIs). -Patient is made aware of the fact that thyroid hormone replacement is needed for life, dose to be adjusted by periodic monitoring of thyroid function tests. - Will check thyroid tests before next visit in 6 months: TSH, free T4. -  Her follow-up ultrasound of the thyroid shows normal size thyroid with 1.1 cm left lobe nodule previously not observed. She will not need any intervention at this time ,however will need a follow-up thyroid/neck ultrasound in 1 year.   05/19/16, MD Phone: [phone]  Fax: [phone]   09/21/2016, 9:43 AM

## 2016-10-29 ENCOUNTER — Ambulatory Visit (INDEPENDENT_AMBULATORY_CARE_PROVIDER_SITE_OTHER): Payer: Self-pay

## 2016-10-29 ENCOUNTER — Ambulatory Visit (INDEPENDENT_AMBULATORY_CARE_PROVIDER_SITE_OTHER): Payer: BC Managed Care – PPO | Admitting: Orthopaedic Surgery

## 2016-10-29 ENCOUNTER — Encounter (INDEPENDENT_AMBULATORY_CARE_PROVIDER_SITE_OTHER): Payer: Self-pay | Admitting: Orthopaedic Surgery

## 2016-10-29 VITALS — BP 120/81 | HR 105 | Ht 67.0 in | Wt 219.0 lb

## 2016-10-29 DIAGNOSIS — M1289 Other specific arthropathies, not elsewhere classified, multiple sites: Secondary | ICD-10-CM

## 2016-10-29 DIAGNOSIS — M069 Rheumatoid arthritis, unspecified: Secondary | ICD-10-CM

## 2016-10-29 DIAGNOSIS — M25562 Pain in left knee: Secondary | ICD-10-CM | POA: Diagnosis not present

## 2016-10-29 NOTE — Progress Notes (Signed)
Office Visit Note   Patient: Laura Allen           Date of Birth: [birth year]           MRN: [id number] Visit Date: 10/29/2016              Requested by: Assunta Found, MD [address] [place], [place] [address] PCP: Colette Ribas, MD   Assessment & Plan: Visit Diagnoses:  1. Rheumatoid arthritis involving left knee, unspecified rheumatoid factor presence (HCC)   2. Rheumatic joint disease     Plan: Patient has end-stage rheumatoid arthritis with some radiographic features of osteoarthritis as well. She is bone-on-bone changes marginal joint erosion chronic synovitis and some medial subluxation of the femur on the tibia. She is using a cane as felt conservative treatment including maximum rheumatologic treatment with rheumatologist. She has had the recent cortisone injection a few weeks ago and states she is ready to proceed with left total knee arthroplasty as soon as possible so she can continue ambulating.  Follow-Up Instructions: No Follow-up on file.   Orders:  Orders Placed This Encounter  Procedures  . XR KNEE 3 VIEW LEFT   No orders of the defined types were placed in this encounter.     Procedures: No procedures performed   Clinical Data: No additional findings.   Subjective: Chief Complaint  Patient presents with  . Left Knee - Pain    Patient comes in today with complaint of left knee pain. She wants to discuss total knee replacement. She received a cortisone injection last week by her rheumatologist.  Patients in multiple cortisone injections in her knee is no longer effective. She's been on rheumatologic medication. Opposite right total knee arthroplasty from 2016 has been doing very well without problems.  Review of Systems  Constitutional: Negative for chills and diaphoresis.  HENT: Negative for ear discharge, ear pain and nosebleeds.   Eyes: Negative for discharge and visual disturbance.  Respiratory: Negative for cough, choking and  shortness of breath.   Cardiovascular: Negative for chest pain and palpitations.  Gastrointestinal: Negative for abdominal distention and abdominal pain.  Endocrine: Negative for cold intolerance and heat intolerance.  Genitourinary: Negative for flank pain and hematuria.  Musculoskeletal:       Positive for rheumatoid arthritis she's had the total knee arthroplasty right knee 2016 doing well.  Skin: Negative for rash and wound.  Neurological: Negative for seizures and speech difficulty.  Hematological: Negative for adenopathy. Does not bruise/bleed easily.  Psychiatric/Behavioral: Negative for agitation and suicidal ideas.     Objective: Vital Signs: BP 120/81   Pulse (!) 105   Ht 5\' 7"  (1.702 m)   Wt 219 lb (99.3 kg)   BMI 34.30 kg/m   Physical Exam  Constitutional: She is oriented to person, place, and time. She appears well-developed.  HENT:  Head: Normocephalic.  Right Ear: External ear normal.  Left Ear: External ear normal.  Eyes: Pupils are equal, round, and reactive to light.  Neck: No tracheal deviation present. No thyromegaly present.  Cardiovascular: Normal rate.   Pulmonary/Chest: Effort normal.  Abdominal: Soft.  Musculoskeletal:  Patient's a mature with a cane well-healed right total knee arthroplasty incision. She has some slight ulnar drift MCP joints bilaterally. Minimal DIP changes. Left knee has crepitus with range of motion mild varus deformity and positive synovitis. Distal pulses are 2+ negative straight leg raising negative popped a compression test collateral ligaments are stable with some mild medial laxity. No pain  with hip range of motion. Sciatic notch tenderness.  Neurological: She is alert and oriented to person, place, and time.  Skin: Skin is warm and dry.  Psychiatric: She has a normal mood and affect. Her behavior is normal.    Ortho Exam well-healed right total knee arthroplasty incision good collateral balance good range of motion. Flexion  past 90 no synovitis.  Specialty Comments:  No specialty comments available.  Imaging: Xr Knee 3 View Left  Result Date: 10/29/2016 [facility] and lateral standing x-ray of the knees obtained and lateral the left knee. This shows well fixed in position right total knee arthroplasty. Left knee shows bone-on-bone changes medial femoral condyle flattening marginal osteophytes subchondral sclerosis and mild varus deformity. Impression: End-stage  rheumatoid arthritis with osteoarthritis changes as well. left knee. Satisfactory position right cemented total knee arthroplasty    PMFS History: Patient Active Problem List   Diagnosis Date Noted  . Nontoxic multinodular goiter 03/19/2016  . Primary hypothyroidism 09/11/2015  . Status post total right knee replacement 05/20/2015  . Rheumatic joint disease 12/03/2011  . Unilateral primary osteoarthritis, left knee 12/03/2011   Past Medical History:  Diagnosis Date  . H/O exercise stress test    [facility]  . H/O seasonal allergies   . History of underactive thyroid   . Hypertension   . Hypothyroidism   . Osteoarthritis (arthritis due to wear and tear of joints)   . Rheumatoid arthritis(714.0)     Family History  Problem Relation Age of Onset  . Arthritis    . Cancer    . Diabetes      Past Surgical History:  Procedure Laterality Date  . CATARACT EXTRACTION W/PHACO Left 09/04/2013   Procedure: CATARACT EXTRACTION PHACO AND INTRAOCULAR LENS PLACEMENT (IOC);  Surgeon: Gemma Payor, MD;  Location: [facility];  Service: Ophthalmology;  Laterality: Left;  CDE:  10.22  . CATARACT EXTRACTION W/PHACO Right 09/14/2013   Procedure: CATARACT EXTRACTION PHACO AND INTRAOCULAR LENS PLACEMENT (IOC);  Surgeon: Gemma Payor, MD;  Location: [facility];  Service: Ophthalmology;  Laterality: Right;  CDE:8.81  . CHOLECYSTECTOMY    . COLONOSCOPY    . KNEE ARTHROPLASTY Right 05/20/2015   Procedure: COMPUTER ASSISTED TOTAL KNEE ARTHROPLASTY;  Surgeon: Eldred Manges, MD;  Location: [facility];  Service: Orthopedics;  Laterality: Right;  . OVARIAN CYST SURGERY     Social History   Occupational History  . Not on file.   Social History Main Topics  . Smoking status: Never Smoker  . Smokeless tobacco: Never Used  . Alcohol use No  . Drug use: No  . Sexual activity: Yes    Birth control/ protection: Post-menopausal

## 2016-11-18 ENCOUNTER — Encounter (HOSPITAL_COMMUNITY)
Admission: RE | Admit: 2016-11-18 | Discharge: 2016-11-18 | Disposition: A | Discharge status: Home / Self Care | Payer: BC Managed Care – PPO | Source: Ambulatory Visit | Attending: Orthopaedic Surgery | Admitting: Orthopaedic Surgery

## 2016-11-18 ENCOUNTER — Encounter (HOSPITAL_COMMUNITY): Payer: Self-pay

## 2016-11-18 ENCOUNTER — Ambulatory Visit (INDEPENDENT_AMBULATORY_CARE_PROVIDER_SITE_OTHER): Payer: BC Managed Care – PPO | Admitting: Orthopaedic Surgery

## 2016-11-18 DIAGNOSIS — M1712 Unilateral primary osteoarthritis, left knee: Secondary | ICD-10-CM | POA: Insufficient documentation

## 2016-11-18 DIAGNOSIS — Z01818 Encounter for other preprocedural examination: Secondary | ICD-10-CM | POA: Diagnosis present

## 2016-11-18 DIAGNOSIS — Z01810 Encounter for preprocedural cardiovascular examination: Secondary | ICD-10-CM | POA: Diagnosis not present

## 2016-11-18 HISTORY — DX: Sleep apnea, unspecified: G47.30

## 2016-11-18 HISTORY — DX: Anemia, unspecified: D64.9

## 2016-11-18 HISTORY — DX: Bronchitis, not specified as acute or chronic: J40

## 2016-11-18 HISTORY — DX: Presence of spectacles and contact lenses: Z97.3

## 2016-11-18 HISTORY — DX: Presence of external hearing-aid: Z97.4

## 2016-11-18 HISTORY — DX: Family history of other specified conditions: Z84.89

## 2016-11-18 HISTORY — DX: Acute pancreatitis without necrosis or infection, unspecified: K85.90

## 2016-11-18 LAB — CBC
HCT: 42.3 % (ref 36.0–46.0)
Hemoglobin: 13.6 g/dL (ref 12.0–15.0)
MCH: 28.2 pg (ref 26.0–34.0)
MCHC: 32.2 g/dL (ref 30.0–36.0)
MCV: 87.8 fL (ref 78.0–100.0)
PLATELETS: 333 10*3/uL (ref 150–400)
RBC: 4.82 MIL/uL (ref 3.87–5.11)
RDW: 15.4 % (ref 11.5–15.5)
WBC: 10.1 10*3/uL (ref 4.0–10.5)

## 2016-11-18 LAB — URINALYSIS, ROUTINE W REFLEX MICROSCOPIC
BACTERIA UA: NONE SEEN
Bilirubin Urine: NEGATIVE
Glucose, UA: NEGATIVE mg/dL
Ketones, ur: NEGATIVE mg/dL
Nitrite: NEGATIVE
PROTEIN: NEGATIVE mg/dL
Specific Gravity, Urine: 1.02 (ref 1.005–1.030)
pH: 5 (ref 5.0–8.0)

## 2016-11-18 LAB — COMPREHENSIVE METABOLIC PANEL
ALT: 13 U/L — ABNORMAL LOW (ref 14–54)
AST: 18 U/L (ref 15–41)
Albumin: 3.8 g/dL (ref 3.5–5.0)
Alkaline Phosphatase: 114 U/L (ref 38–126)
Anion gap: 8 (ref 5–15)
BUN: 16 mg/dL (ref 6–20)
CHLORIDE: 104 mmol/L (ref 101–111)
CO2: 26 mmol/L (ref 22–32)
Calcium: 9.5 mg/dL (ref 8.9–10.3)
Creatinine, Ser: 0.81 mg/dL (ref 0.44–1.00)
Glucose, Bld: 102 mg/dL — ABNORMAL HIGH (ref 65–99)
POTASSIUM: 4.6 mmol/L (ref 3.5–5.1)
Sodium: 138 mmol/L (ref 135–145)
Total Bilirubin: 0.5 mg/dL (ref 0.3–1.2)
Total Protein: 7.9 g/dL (ref 6.5–8.1)

## 2016-11-18 LAB — TYPE AND SCREEN
ABO/RH(D): O POS
ANTIBODY SCREEN: NEGATIVE

## 2016-11-18 LAB — PROTIME-INR
INR: 0.91
PROTHROMBIN TIME: 12.2 s (ref 11.4–15.2)

## 2016-11-18 LAB — SURGICAL PCR SCREEN
MRSA, PCR: NEGATIVE
STAPHYLOCOCCUS AUREUS: NEGATIVE

## 2016-11-18 LAB — APTT: aPTT: 28 seconds (ref 24–36)

## 2016-11-18 LAB — ABO/RH: ABO/RH(D): O POS

## 2016-11-18 NOTE — Progress Notes (Signed)
Pt denies SOB, chest pain, and being under the care of a cardiologist. Pt denies having a cardiac cath but stated that a stress test and echo was performed at [facility] " several years ago, it was okay."  Pt cardiac records requested.

## 2016-11-18 NOTE — Pre-Procedure Instructions (Signed)
    Laura Allen  11/18/2016      [id number] - [place],  - [address] AT [facility] [address] [place] [address] Phone: [phone] Fax: [phone]    Your procedure is scheduled on Wednesday, November 25, 2016  Report to [facility] Admitting at 10:30 A.M.  Call this number if you have problems the morning of surgery:  [phone]   Remember:  Do not eat food or drink liquids after midnight Tuesday, November 24, 2016  Take these medicines the morning of surgery with A SIP OF WATER : diltiazem (CARDIZEM SR), levothyroxine (SYNTHROID),  if needed: loratadine (CLARITIN) for allergies, pain medication,  predniSONE (DELTASONE) for rheumatoid arthritis flare ups) Stop taking Aspirin,vitamins, fish oil and herbal medications. Do not take any NSAIDs ie: Ibuprofen, Advil, Naproxen, BC and Goody Powder or any medication containing Aspirin; stop now.  Do not wear jewelry, make-up or nail polish.  Do not wear lotions, powders, or perfumes, or deoderant.  Do not shave 48 hours prior to surgery.    Do not bring valuables to the hospital.  [facility] is not responsible for any belongings or valuables. Contacts, dentures or bridgework may not be worn into surgery.  Leave your suitcase in the car.  After surgery it may be brought to your room. For patients admitted to the hospital, discharge time will be determined by your treatment team Special instructions: Shower the night before surgery and the morning of surgery with CHG. Please read over the following fact sheets that you were given. Pain Booklet, Coughing and Deep Breathing, Blood Transfusion Information, MRSA Information and Surgical Site Infection Prevention

## 2016-11-18 NOTE — Progress Notes (Signed)
Pt denies having a chest x ray and EKG within the last year. 

## 2016-11-20 NOTE — Progress Notes (Signed)
[facility] (now [facility]  with [facility]) states that pt.records are locked up in a building downtown and it will take over a week to obtain them. Pt does not remember exactly when done and [facility] has no recent records.

## 2016-11-25 ENCOUNTER — Inpatient Hospital Stay (HOSPITAL_COMMUNITY): Payer: BC Managed Care – PPO | Admitting: Anesthesiology

## 2016-11-25 ENCOUNTER — Inpatient Hospital Stay (HOSPITAL_COMMUNITY): Payer: BC Managed Care – PPO

## 2016-11-25 ENCOUNTER — Encounter (HOSPITAL_COMMUNITY)
Admission: RE | Disposition: A | Discharge status: Home Health | Payer: Self-pay | Source: Ambulatory Visit | Attending: Orthopaedic Surgery

## 2016-11-25 ENCOUNTER — Inpatient Hospital Stay (HOSPITAL_COMMUNITY)
Admission: RE | Admit: 2016-11-25 | Discharge: 2016-11-27 | DRG: 470 | Disposition: A | Discharge status: Home Health | Payer: BC Managed Care – PPO | Source: Ambulatory Visit | Attending: Orthopaedic Surgery | Admitting: Orthopaedic Surgery

## 2016-11-25 ENCOUNTER — Encounter (HOSPITAL_COMMUNITY): Payer: Self-pay | Admitting: *Deleted

## 2016-11-25 DIAGNOSIS — Z09 Encounter for follow-up examination after completed treatment for conditions other than malignant neoplasm: Secondary | ICD-10-CM

## 2016-11-25 DIAGNOSIS — M069 Rheumatoid arthritis, unspecified: Secondary | ICD-10-CM | POA: Diagnosis present

## 2016-11-25 DIAGNOSIS — E039 Hypothyroidism, unspecified: Secondary | ICD-10-CM | POA: Diagnosis present

## 2016-11-25 DIAGNOSIS — M1712 Unilateral primary osteoarthritis, left knee: Secondary | ICD-10-CM | POA: Diagnosis present

## 2016-11-25 DIAGNOSIS — I10 Essential (primary) hypertension: Secondary | ICD-10-CM | POA: Diagnosis present

## 2016-11-25 DIAGNOSIS — Z96651 Presence of right artificial knee joint: Secondary | ICD-10-CM | POA: Diagnosis present

## 2016-11-25 DIAGNOSIS — M25662 Stiffness of left knee, not elsewhere classified: Secondary | ICD-10-CM

## 2016-11-25 DIAGNOSIS — M25562 Pain in left knee: Secondary | ICD-10-CM | POA: Diagnosis present

## 2016-11-25 DIAGNOSIS — R262 Difficulty in walking, not elsewhere classified: Secondary | ICD-10-CM

## 2016-11-25 HISTORY — PX: TOTAL KNEE ARTHROPLASTY: SHX125

## 2016-11-25 SURGERY — ARTHROPLASTY, KNEE, TOTAL
Anesthesia: Spinal | Site: Knee | Laterality: Left

## 2016-11-25 MED ORDER — ACETAMINOPHEN 650 MG RE SUPP: 650.0000 mg | Freq: Four times a day (QID) | RECTAL | Status: DC | PRN

## 2016-11-25 MED ORDER — LORATADINE 10 MG PO TABS: 10.0000 mg | ORAL_TABLET | Freq: Every day | ORAL | Status: DC | PRN

## 2016-11-25 MED ORDER — CEFAZOLIN SODIUM-DEXTROSE 2-4 GM/100ML-% IV SOLN
2.0000 g | INTRAVENOUS | Status: AC
  Administered 2016-11-25: 2 g via INTRAVENOUS
  Filled 2016-11-25: qty 100

## 2016-11-25 MED ORDER — FOLIC ACID 1 MG PO TABS
1.0000 mg | ORAL_TABLET | Freq: Every day | ORAL | Status: DC
  Administered 2016-11-26 – 2016-11-27 (×2): 1 mg via ORAL
  Filled 2016-11-25 (×2): qty 1

## 2016-11-25 MED ORDER — PROPOFOL 500 MG/50ML IV EMUL
INTRAVENOUS | Status: DC | PRN
  Administered 2016-11-25: 25 ug/kg/min via INTRAVENOUS

## 2016-11-25 MED ORDER — HYDROMORPHONE HCL 2 MG/ML IJ SOLN
0.5000 mg | INTRAMUSCULAR | Status: DC | PRN
  Administered 2016-11-25 – 2016-11-26 (×3): 1 mg via INTRAVENOUS
  Filled 2016-11-25 (×3): qty 1

## 2016-11-25 MED ORDER — SUCCINYLCHOLINE CHLORIDE 200 MG/10ML IV SOSY
PREFILLED_SYRINGE | INTRAVENOUS | Status: AC
  Filled 2016-11-25: qty 10

## 2016-11-25 MED ORDER — METOCLOPRAMIDE HCL 5 MG PO TABS: 5.0000 mg | ORAL_TABLET | Freq: Three times a day (TID) | ORAL | Status: DC | PRN

## 2016-11-25 MED ORDER — POLYETHYLENE GLYCOL 3350 17 G PO PACK: 17.0000 g | PACK | Freq: Every day | ORAL | Status: DC | PRN

## 2016-11-25 MED ORDER — LEVOTHYROXINE SODIUM 100 MCG PO TABS
100.0000 ug | ORAL_TABLET | Freq: Every day | ORAL | Status: DC
  Administered 2016-11-26 – 2016-11-27 (×2): 100 ug via ORAL
  Filled 2016-11-25 (×2): qty 1

## 2016-11-25 MED ORDER — MENTHOL 3 MG MT LOZG: 1.0000 | LOZENGE | OROMUCOSAL | Status: DC | PRN

## 2016-11-25 MED ORDER — PHENYLEPHRINE 40 MCG/ML (10ML) SYRINGE FOR IV PUSH (FOR BLOOD PRESSURE SUPPORT)
PREFILLED_SYRINGE | INTRAVENOUS | Status: AC
  Filled 2016-11-25: qty 10

## 2016-11-25 MED ORDER — EPHEDRINE SULFATE-NACL 50-0.9 MG/10ML-% IV SOSY
PREFILLED_SYRINGE | INTRAVENOUS | Status: DC | PRN
  Administered 2016-11-25 (×2): 5 mg via INTRAVENOUS
  Administered 2016-11-25 (×3): 10 mg via INTRAVENOUS

## 2016-11-25 MED ORDER — OXYCODONE HCL 5 MG PO TABS
5.0000 mg | ORAL_TABLET | ORAL | Status: DC | PRN
  Administered 2016-11-25 – 2016-11-27 (×8): 5 mg via ORAL
  Filled 2016-11-25 (×8): qty 1

## 2016-11-25 MED ORDER — EPHEDRINE 5 MG/ML INJ
INTRAVENOUS | Status: AC
  Filled 2016-11-25: qty 10

## 2016-11-25 MED ORDER — PHENOL 1.4 % MT LIQD: 1.0000 | OROMUCOSAL | Status: DC | PRN

## 2016-11-25 MED ORDER — METHOCARBAMOL 500 MG PO TABS
500.0000 mg | ORAL_TABLET | Freq: Four times a day (QID) | ORAL | Status: DC | PRN
  Administered 2016-11-26 – 2016-11-27 (×2): 500 mg via ORAL
  Filled 2016-11-25 (×2): qty 1

## 2016-11-25 MED ORDER — MIDAZOLAM HCL 2 MG/2ML IJ SOLN
INTRAMUSCULAR | Status: AC
Start: 2016-11-25 — End: 2016-11-25
  Filled 2016-11-25: qty 2

## 2016-11-25 MED ORDER — PROPOFOL 10 MG/ML IV BOLUS
INTRAVENOUS | Status: AC
  Filled 2016-11-25: qty 20

## 2016-11-25 MED ORDER — MIDAZOLAM HCL 5 MG/5ML IJ SOLN
INTRAMUSCULAR | Status: DC | PRN
  Administered 2016-11-25 (×2): 1 mg via INTRAVENOUS

## 2016-11-25 MED ORDER — CHLORHEXIDINE GLUCONATE 4 % EX LIQD: 60.0000 mL | Freq: Once | CUTANEOUS | Status: DC

## 2016-11-25 MED ORDER — FENTANYL CITRATE (PF) 100 MCG/2ML IJ SOLN
INTRAMUSCULAR | Status: AC
  Filled 2016-11-25: qty 2

## 2016-11-25 MED ORDER — SODIUM CHLORIDE 0.9 % IV SOLN
INTRAVENOUS | Status: DC
  Administered 2016-11-26: 01:00:00 via INTRAVENOUS

## 2016-11-25 MED ORDER — ASPIRIN EC 325 MG PO TBEC
325.0000 mg | DELAYED_RELEASE_TABLET | Freq: Every day | ORAL | Status: DC
  Administered 2016-11-26 – 2016-11-27 (×2): 325 mg via ORAL
  Filled 2016-11-25 (×2): qty 1

## 2016-11-25 MED ORDER — METHOCARBAMOL 1000 MG/10ML IJ SOLN
500.0000 mg | Freq: Four times a day (QID) | INTRAVENOUS | Status: DC | PRN
  Filled 2016-11-25: qty 5

## 2016-11-25 MED ORDER — METHOTREXATE 2.5 MG PO TABS
10.0000 mg | ORAL_TABLET | ORAL | Status: DC
  Administered 2016-11-26 – 2016-11-27 (×2): 10 mg via ORAL
  Filled 2016-11-25 (×3): qty 4

## 2016-11-25 MED ORDER — DILTIAZEM HCL ER 60 MG PO CP12
120.0000 mg | ORAL_CAPSULE | Freq: Every day | ORAL | Status: DC
  Administered 2016-11-26 – 2016-11-27 (×2): 120 mg via ORAL
  Filled 2016-11-25 (×3): qty 2

## 2016-11-25 MED ORDER — BUPIVACAINE HCL (PF) 0.25 % IJ SOLN
INTRAMUSCULAR | Status: AC
  Filled 2016-11-25: qty 30

## 2016-11-25 MED ORDER — POTASSIUM GLUCONATE 550 MG PO TABS: 550.0000 mg | ORAL_TABLET | Freq: Every day | ORAL | Status: DC

## 2016-11-25 MED ORDER — MIDAZOLAM HCL 2 MG/2ML IJ SOLN
INTRAMUSCULAR | Status: AC
  Filled 2016-11-25: qty 2

## 2016-11-25 MED ORDER — ROCURONIUM BROMIDE 50 MG/5ML IV SOSY
PREFILLED_SYRINGE | INTRAVENOUS | Status: AC
  Filled 2016-11-25: qty 5

## 2016-11-25 MED ORDER — METOCLOPRAMIDE HCL 5 MG/ML IJ SOLN: 5.0000 mg | Freq: Three times a day (TID) | INTRAMUSCULAR | Status: DC | PRN

## 2016-11-25 MED ORDER — ONDANSETRON HCL 4 MG/2ML IJ SOLN
4.0000 mg | Freq: Four times a day (QID) | INTRAMUSCULAR | Status: DC | PRN
  Administered 2016-11-26: 4 mg via INTRAVENOUS
  Filled 2016-11-25: qty 2

## 2016-11-25 MED ORDER — ONDANSETRON HCL 4 MG PO TABS: 4.0000 mg | ORAL_TABLET | Freq: Four times a day (QID) | ORAL | Status: DC | PRN

## 2016-11-25 MED ORDER — VITAMIN D 1000 UNITS PO TABS
1000.0000 [IU] | ORAL_TABLET | Freq: Every day | ORAL | Status: DC
  Administered 2016-11-26 – 2016-11-27 (×2): 1000 [IU] via ORAL
  Filled 2016-11-25 (×2): qty 1

## 2016-11-25 MED ORDER — ACETAMINOPHEN 325 MG PO TABS: 650.0000 mg | ORAL_TABLET | Freq: Four times a day (QID) | ORAL | Status: DC | PRN

## 2016-11-25 MED ORDER — BUPIVACAINE IN DEXTROSE 0.75-8.25 % IT SOLN
INTRATHECAL | Status: DC | PRN
  Administered 2016-11-25: 2 mL via INTRATHECAL

## 2016-11-25 MED ORDER — FENTANYL CITRATE (PF) 250 MCG/5ML IJ SOLN
INTRAMUSCULAR | Status: DC | PRN
  Administered 2016-11-25 (×2): 50 ug via INTRAVENOUS

## 2016-11-25 MED ORDER — PHENYLEPHRINE 40 MCG/ML (10ML) SYRINGE FOR IV PUSH (FOR BLOOD PRESSURE SUPPORT)
PREFILLED_SYRINGE | INTRAVENOUS | Status: DC | PRN
  Administered 2016-11-25: 40 ug via INTRAVENOUS
  Administered 2016-11-25 (×3): 80 ug via INTRAVENOUS
  Administered 2016-11-25: 40 ug via INTRAVENOUS
  Administered 2016-11-25 (×3): 80 ug via INTRAVENOUS

## 2016-11-25 MED ORDER — DOCUSATE SODIUM 100 MG PO CAPS
100.0000 mg | ORAL_CAPSULE | Freq: Two times a day (BID) | ORAL | Status: DC
  Administered 2016-11-25 – 2016-11-27 (×4): 100 mg via ORAL
  Filled 2016-11-25 (×4): qty 1

## 2016-11-25 MED ORDER — HYDROMORPHONE HCL 1 MG/ML IJ SOLN: 0.2500 mg | INTRAMUSCULAR | Status: DC | PRN

## 2016-11-25 MED ORDER — LACTATED RINGERS IV SOLN
INTRAVENOUS | Status: DC
  Administered 2016-11-25 (×2): via INTRAVENOUS

## 2016-11-25 MED ORDER — HUMIRA 40 MG/0.8ML ~~LOC~~ KIT: 40.0000 mg | PACK | SUBCUTANEOUS | Status: DC

## 2016-11-25 MED ORDER — LIDOCAINE 2% (20 MG/ML) 5 ML SYRINGE
INTRAMUSCULAR | Status: AC
  Filled 2016-11-25: qty 5

## 2016-11-25 MED ORDER — SODIUM CHLORIDE 0.9 % IR SOLN
Status: DC | PRN
Start: 2016-11-25 — End: 2016-11-25
  Administered 2016-11-25: 3000 mL
  Administered 2016-11-25: 1000 mL

## 2016-11-25 SURGICAL SUPPLY — 70 items
APL SKNCLS STERI-STRIP NONHPOA (GAUZE/BANDAGES/DRESSINGS) ×1
BANDAGE ACE 4X5 VEL STRL LF (GAUZE/BANDAGES/DRESSINGS) ×2 IMPLANT
BANDAGE ESMARK 6X9 LF (GAUZE/BANDAGES/DRESSINGS) ×1 IMPLANT
BENZOIN TINCTURE PRP APPL 2/3 (GAUZE/BANDAGES/DRESSINGS) ×2 IMPLANT
BLADE SAGITTAL 25.0X1.19X90 (BLADE) ×2 IMPLANT
BLADE SAW SGTL 13X75X1.27 (BLADE) ×2 IMPLANT
BNDG CMPR 9X6 STRL LF SNTH (GAUZE/BANDAGES/DRESSINGS) ×1
BNDG CMPR MED 10X6 ELC LF (GAUZE/BANDAGES/DRESSINGS) ×1
BNDG ELASTIC 6X10 VLCR STRL LF (GAUZE/BANDAGES/DRESSINGS) ×2 IMPLANT
BNDG ESMARK 6X9 LF (GAUZE/BANDAGES/DRESSINGS) ×2
BOWL SMART MIX CTS (DISPOSABLE) ×2 IMPLANT
CAPT KNEE TOTAL 3 ATTUNE ×1 IMPLANT
CEMENT HV SMART SET (Cement) ×4 IMPLANT
COVER SURGICAL LIGHT HANDLE (MISCELLANEOUS) ×2 IMPLANT
CUFF TOURNIQUET SINGLE 34IN LL (TOURNIQUET CUFF) ×2 IMPLANT
CUFF TOURNIQUET SINGLE 44IN (TOURNIQUET CUFF) IMPLANT
DRAPE ORTHO SPLIT 77X108 STRL (DRAPES) ×4
DRAPE SURG ORHT 6 SPLT 77X108 (DRAPES) ×2 IMPLANT
DRAPE U-SHAPE 47X51 STRL (DRAPES) ×2 IMPLANT
DRSG PAD ABDOMINAL 8X10 ST (GAUZE/BANDAGES/DRESSINGS) ×2 IMPLANT
DURAPREP 26ML APPLICATOR (WOUND CARE) ×2 IMPLANT
ELECT REM PT RETURN 9FT ADLT (ELECTROSURGICAL) ×2
ELECTRODE REM PT RTRN 9FT ADLT (ELECTROSURGICAL) ×1 IMPLANT
EVACUATOR 1/8 PVC DRAIN (DRAIN) IMPLANT
FACESHIELD WRAPAROUND (MASK) ×2 IMPLANT
FACESHIELD WRAPAROUND OR TEAM (MASK) ×1 IMPLANT
GAUZE SPONGE 4X4 12PLY STRL (GAUZE/BANDAGES/DRESSINGS) ×2 IMPLANT
GAUZE XEROFORM 1X8 LF (GAUZE/BANDAGES/DRESSINGS) ×1 IMPLANT
GAUZE XEROFORM 5X9 LF (GAUZE/BANDAGES/DRESSINGS) ×2 IMPLANT
GLOVE BIOGEL PI IND STRL 8 (GLOVE) ×2 IMPLANT
GLOVE BIOGEL PI INDICATOR 8 (GLOVE) ×2
GLOVE ORTHO TXT STRL SZ7.5 (GLOVE) ×4 IMPLANT
GOWN STRL REUS W/ TWL LRG LVL3 (GOWN DISPOSABLE) ×1 IMPLANT
GOWN STRL REUS W/ TWL XL LVL3 (GOWN DISPOSABLE) ×1 IMPLANT
GOWN STRL REUS W/TWL 2XL LVL3 (GOWN DISPOSABLE) ×2 IMPLANT
GOWN STRL REUS W/TWL LRG LVL3 (GOWN DISPOSABLE) ×2
GOWN STRL REUS W/TWL XL LVL3 (GOWN DISPOSABLE) ×2
HANDPIECE INTERPULSE COAX TIP (DISPOSABLE) ×2
IMMOBILIZER KNEE 22 (SOFTGOODS) ×1 IMPLANT
IMMOBILIZER KNEE 22 UNIV (SOFTGOODS) ×2 IMPLANT
KIT BASIN OR (CUSTOM PROCEDURE TRAY) ×2 IMPLANT
KIT ROOM TURNOVER OR (KITS) ×2 IMPLANT
MANIFOLD NEPTUNE II (INSTRUMENTS) ×2 IMPLANT
MARKER SKIN DUAL TIP RULER LAB (MISCELLANEOUS) ×2 IMPLANT
NDL HYPO 25GX1X1/2 BEV (NEEDLE) ×1 IMPLANT
NEEDLE HYPO 25GX1X1/2 BEV (NEEDLE) ×2 IMPLANT
NS IRRIG 1000ML POUR BTL (IV SOLUTION) ×2 IMPLANT
PACK TOTAL JOINT (CUSTOM PROCEDURE TRAY) ×2 IMPLANT
PAD ARMBOARD 7.5X6 YLW CONV (MISCELLANEOUS) ×4 IMPLANT
PAD CAST 4YDX4 CTTN HI CHSV (CAST SUPPLIES) ×1 IMPLANT
PADDING CAST ABS 4INX4YD NS (CAST SUPPLIES) ×1
PADDING CAST ABS COTTON 4X4 ST (CAST SUPPLIES) IMPLANT
PADDING CAST COTTON 4X4 STRL (CAST SUPPLIES) ×2
PADDING CAST COTTON 6X4 STRL (CAST SUPPLIES) ×2 IMPLANT
SET HNDPC FAN SPRY TIP SCT (DISPOSABLE) ×1 IMPLANT
STAPLER VISISTAT 35W (STAPLE) ×2 IMPLANT
STRIP CLOSURE SKIN 1/2X4 (GAUZE/BANDAGES/DRESSINGS) ×4 IMPLANT
SUCTION FRAZIER HANDLE 10FR (MISCELLANEOUS) ×1
SUCTION TUBE FRAZIER 10FR DISP (MISCELLANEOUS) ×1 IMPLANT
SUT VIC AB 0 CT1 27 (SUTURE) ×2
SUT VIC AB 0 CT1 27XBRD ANBCTR (SUTURE) ×1 IMPLANT
SUT VIC AB 1 CTX 36 (SUTURE) ×4
SUT VIC AB 1 CTX36XBRD ANBCTR (SUTURE) ×2 IMPLANT
SUT VIC AB 2-0 CT1 27 (SUTURE) ×4
SUT VIC AB 2-0 CT1 TAPERPNT 27 (SUTURE) ×2 IMPLANT
SUT VIC AB 3-0 X1 27 (SUTURE) ×2 IMPLANT
SYR CONTROL 10ML LL (SYRINGE) ×2 IMPLANT
TOWEL OR 17X24 6PK STRL BLUE (TOWEL DISPOSABLE) ×2 IMPLANT
TOWEL OR 17X26 10 PK STRL BLUE (TOWEL DISPOSABLE) ×2 IMPLANT
TRAY CATH 16FR W/PLASTIC CATH (SET/KITS/TRAYS/PACK) IMPLANT

## 2016-11-25 NOTE — Transfer of Care (Signed)
Immediate Anesthesia Transfer of Care Note  Patient: Laura Allen  Procedure(s) Performed: Procedure(s): LEFT TOTAL KNEE ARTHROPLASTY (Left)  Patient Location: PACU  Anesthesia Type:Regional  Level of Consciousness: awake, alert , oriented and patient cooperative  Airway & Oxygen Therapy: Patient Spontanous Breathing and Patient connected to nasal cannula oxygen  Post-op Assessment: Report given to RN and Post -op Vital signs reviewed and stable  Post vital signs: Reviewed  Last Vitals: 95-51, 86, 18, 100% Vitals:   11/25/16 0955  BP: 131/69  Pulse: 98  Resp: 20  Temp: 37.1 C    Last Pain:  Vitals:   11/25/16 0955  TempSrc: Oral      Patients Stated Pain Goal: 5 (11/25/16 1013)  Complications: No apparent anesthesia complications

## 2016-11-25 NOTE — Interval H&P Note (Signed)
History and Physical Interval Note:  11/25/2016 11:45 AM  Laura Allen  has presented today for surgery, with the diagnosis of Left Knee Osteoarthritis  The various methods of treatment have been discussed with the patient and family. After consideration of risks, benefits and other options for treatment, the patient has consented to  Procedure(s): LEFT TOTAL KNEE ARTHROPLASTY (Left) as a surgical intervention .  The patient's history has been reviewed, patient examined, no change in status, stable for surgery.  I have reviewed the patient's chart and labs.  Questions were answered to the patient's satisfaction.     Eldred Manges

## 2016-11-25 NOTE — Op Note (Signed)
Preop diagnosis: Left primary knee osteoarthritis  Postop diagnosis: Same  Procedure: Left cemented total knee arthroplasty.  Surgeon: Annell Greening M.D.  Asst.: April Fulp RN FA  Anesthesia: Spinal  Tourniquet time 46 7 minutes  Implants Depuy attuned of #4 left femur with lugs. #6 tibial baseplate 7 mm spacer rotating platform. 30 mm patella 3 peg.  Procedure standard prepping and draping sterile skin marker Betadine Steri-Drape impervious stockinette Caban leg was wrapped an Esmarch after timeout Ancef was given prophylactically. Midline incision was made medial parapatellar incision spurs removed patient had tricompartmental degenerative changes bone-on-bone changes. Trial sizing was on the femur intramedullary hole left the patella was cut 10 mm incised 438. Sizing for the femur was a #4 and anterior cut was flush with the anterior cortex of the distal femur. Chamfer cuts are made tibial was cut trial sizer showed 6 was good possibly 7. Patient had large spurs removed posteriorly. All meniscal remnants were resected. He'll preparation the tibia and then trials. Pulsatile lavage vacuum mixing of the cement tibia cemented first followed by femur placement. Polly was inserted and patellar component was placed last. Lavage tourniquet deflation after 15 minutes was cement was hard hemostasis obtained in standard layer closure #1 interrupted in the quad tendon fascia 2 on the subtendinous tissue subarticular skin closure postop dressing and knee immobilizer.

## 2016-11-25 NOTE — H&P (Signed)
TOTAL KNEE ADMISSION H&P  Patient is being admitted for left total knee arthroplasty.  Subjective:  Chief Complaint:left knee pain.  HPI: Laura Allen, 66 y.o. female, has a history of pain and functional disability in the left knee due to arthritis and has failed non-surgical conservative treatments for greater than 12 weeks to includeNSAID's and/or analgesics, corticosteriod injections, use of assistive devices and activity modification.  Onset of symptoms was gradual,   Patient currently rates pain in the left knee(s) at 10 out of 10 with activity. Patient has night pain, worsening of pain with activity and weight bearing, pain that interferes with activities of daily living, crepitus and joint swelling.  Patient has evidence of subchondral cysts, subchondral sclerosis, periarticular osteophytes and joint space narrowing by imaging studies. . There is no active infection.  Patient Active Problem List   Diagnosis Date Noted  . Nontoxic multinodular goiter 03/19/2016  . Primary hypothyroidism 09/11/2015  . Status post total right knee replacement 05/20/2015  . Rheumatic joint disease 12/03/2011  . Unilateral primary osteoarthritis, left knee 12/03/2011   Past Medical History:  Diagnosis Date  . Anemia    PMH  . Bronchitis    PMH  . Family history of adverse reaction to anesthesia    mother gets PONV  . H/O exercise stress test    [facility]  . H/O seasonal allergies   . Hearing aid worn    B/L  . History of underactive thyroid   . Hypertension   . Hypothyroidism   . Osteoarthritis (arthritis due to wear and tear of joints)   . Pancreatitis   . Rheumatoid arthritis(714.0)   . Sleep apnea    mild; does not wear CPAP  . Wears glasses     Past Surgical History:  Procedure Laterality Date  . CATARACT EXTRACTION W/PHACO Left 09/04/2013   Procedure: CATARACT EXTRACTION PHACO AND INTRAOCULAR LENS PLACEMENT (IOC);  Surgeon: Gemma Payor, MD;  Location: [facility];   Service: Ophthalmology;  Laterality: Left;  CDE:  10.22  . CATARACT EXTRACTION W/PHACO Right 09/14/2013   Procedure: CATARACT EXTRACTION PHACO AND INTRAOCULAR LENS PLACEMENT (IOC);  Surgeon: Gemma Payor, MD;  Location: [facility];  Service: Ophthalmology;  Laterality: Right;  CDE:8.81  . CHOLECYSTECTOMY    . COLONOSCOPY    . KNEE ARTHROPLASTY Right 05/20/2015   Procedure: COMPUTER ASSISTED TOTAL KNEE ARTHROPLASTY;  Surgeon: Eldred Manges, MD;  Location: [facility];  Service: Orthopedics;  Laterality: Right;  . OVARIAN CYST SURGERY      No prescriptions prior to admission.   Allergies  Allergen Reactions  . Ibuprofen Swelling    Can take in small doses.  Large doses cause swelling.   . Levofloxacin Hives and Rash  . Streptomycin Hives and Rash    Social History  Substance Use Topics  . Smoking status: Never Smoker  . Smokeless tobacco: Never Used  . Alcohol use No    Family History  Problem Relation Age of Onset  . Arthritis    . Cancer    . Diabetes       Review of Systems  Constitutional: Negative.   HENT: Negative.   Eyes: Negative.   Respiratory: Negative.   Cardiovascular: Negative.   Gastrointestinal: Negative.   Genitourinary: Negative.   Musculoskeletal: Positive for joint pain.  Skin: Negative.   Neurological: Negative.   Psychiatric/Behavioral: Negative.     Objective:  Physical Exam  Constitutional: She is oriented to person, place, and time. No distress.  HENT:  Head: Normocephalic and atraumatic.  Eyes: EOM are normal. Pupils are equal, round, and reactive to light.  Neck: Normal range of motion.  Respiratory: Effort normal. No respiratory distress.  GI: She exhibits no distension.  Musculoskeletal: She exhibits tenderness.  Neurological: She is alert and oriented to person, place, and time.    Vital signs in last 24 hours:    Labs:   Estimated body mass index is 36.1 kg/m as calculated from the following:   Height as of 11/18/16: 5\' 7"  (1.702 m).    Weight as of 11/18/16: 230 lb 8 oz (104.6 kg).   Imaging Review Plain radiographs demonstrate moderate degenerative joint disease of the left knee(s). The overall alignment ismild varus. The bone quality appears to be good for age and reported activity level.  Assessment/Plan:  End stage arthritis, left knee   The patient history, physical examination, clinical judgment of the provider and imaging studies are consistent with end stage degenerative joint disease of the left knee(s) and total knee arthroplasty is deemed medically necessary. The treatment options including medical management, injection therapy arthroscopy and arthroplasty were discussed at length. The risks and benefits of total knee arthroplasty were presented and reviewed. The risks due to aseptic loosening, infection, stiffness, patella tracking problems, thromboembolic complications and other imponderables were discussed. The patient acknowledged the explanation, agreed to proceed with the plan and consent was signed. Patient is being admitted for inpatient treatment for surgery, pain control, PT, OT, prophylactic antibiotics, VTE prophylaxis, progressive ambulation and ADL's and discharge planning. The patient is planning to be discharged home with home health services

## 2016-11-25 NOTE — Anesthesia Procedure Notes (Signed)
Spinal  Patient location during procedure: OR Start time: 11/25/2016 12:40 PM End time: 11/25/2016 12:48 PM Staffing Anesthesiologist: Juriel Cid, Iona Beard Performed: anesthesiologist  Preanesthetic Checklist Completed: patient identified, site marked, surgical consent, pre-op evaluation, timeout performed, IV checked, risks and benefits discussed and monitors and equipment checked Spinal Block Patient position: sitting Prep: Betadine Patient monitoring: heart rate, continuous pulse ox and blood pressure Location: L4-5 Injection technique: single-shot Needle Needle type: Sprotte  Needle gauge: 24 G Needle length: 9 cm Additional Notes Expiration date of kit checked and confirmed. Patient tolerated procedure well, without complications.

## 2016-11-25 NOTE — Progress Notes (Signed)
Orthopedic Tech Progress Note Patient Details:  Laura Allen 10-16-1951 [id number]  CPM Left Knee CPM Left Knee: On Left Knee Flexion (Degrees): 90 Left Knee Extension (Degrees): 0 Additional Comments: trapeze bar patient helper   Nikki Dom 11/25/2016, 3:22 PM Viewed order from doctor's order list

## 2016-11-25 NOTE — Anesthesia Preprocedure Evaluation (Addendum)
Anesthesia Evaluation  Patient identified by MRN, date of birth, ID band Patient awake    Reviewed: Allergy & Precautions, NPO status   Airway Mallampati: II  TM Distance: >3 FB     Dental   Pulmonary sleep apnea ,    breath sounds clear to auscultation       Cardiovascular hypertension,  Rhythm:Regular Rate:Normal     Neuro/Psych    GI/Hepatic negative GI ROS, Neg liver ROS,   Endo/Other  Hypothyroidism   Renal/GU negative Renal ROS     Musculoskeletal  (+) Arthritis ,   Abdominal   Peds  Hematology  (+) anemia ,   Anesthesia Other Findings   Reproductive/Obstetrics                             Anesthesia Physical Anesthesia Plan  ASA: III  Anesthesia Plan: Spinal   Post-op Pain Management:    Induction: Intravenous  Airway Management Planned: Simple Face Mask  Additional Equipment:   Intra-op Plan:   Post-operative Plan:   Informed Consent: I have reviewed the patients History and Physical, chart, labs and discussed the procedure including the risks, benefits and alternatives for the proposed anesthesia with the patient or authorized representative who has indicated his/her understanding and acceptance.   Dental advisory given  Plan Discussed with: CRNA and Anesthesiologist  Anesthesia Plan Comments:        Anesthesia Quick Evaluation

## 2016-11-26 LAB — CBC
HCT: 32.8 % — ABNORMAL LOW (ref 36.0–46.0)
Hemoglobin: 10.4 g/dL — ABNORMAL LOW (ref 12.0–15.0)
MCH: 28.2 pg (ref 26.0–34.0)
MCHC: 31.7 g/dL (ref 30.0–36.0)
MCV: 88.9 fL (ref 78.0–100.0)
PLATELETS: 301 10*3/uL (ref 150–400)
RBC: 3.69 MIL/uL — AB (ref 3.87–5.11)
RDW: 15.8 % — AB (ref 11.5–15.5)
WBC: 12.5 10*3/uL — AB (ref 4.0–10.5)

## 2016-11-26 LAB — BASIC METABOLIC PANEL
ANION GAP: 6 (ref 5–15)
BUN: 13 mg/dL (ref 6–20)
CO2: 27 mmol/L (ref 22–32)
Calcium: 8.5 mg/dL — ABNORMAL LOW (ref 8.9–10.3)
Chloride: 103 mmol/L (ref 101–111)
Creatinine, Ser: 0.78 mg/dL (ref 0.44–1.00)
GFR calc Af Amer: >60 mL/min (ref >60)
Glucose, Bld: 135 mg/dL — ABNORMAL HIGH (ref 65–99)
POTASSIUM: 4.4 mmol/L (ref 3.5–5.1)
SODIUM: 136 mmol/L (ref 135–145)

## 2016-11-26 NOTE — Evaluation (Signed)
Physical Therapy Evaluation Patient Details Name: Laura Allen MRN: [id number] DOB: [birth year] Today's Date: 11/26/2016   History of Present Illness  66 yo female admitted on 11/25/16 for left TKA. PMH significant for R TKA 2016, nontoxic goiter, hypothyroid, RA, HTN, sleep apnea.    Clinical Impression  Pt is POD 1 following the above procedure. Prior to admission, pt was independent with cane PRN and was able to perform her own ADLs and IADLs. Pt was driving short distances. Pt will be returning home with her mother who will assist her until she is ready to go home with her husband. Pt requires min a for transfers from bed to [facility] and BSC to recliner. Pt will benefit from continuing to be seen acutely in order to assist with safe transition home.     Follow Up Recommendations Home health PT;Supervision for mobility/OOB    Equipment Recommendations  None recommended by PT    Recommendations for Other Services       Precautions / Restrictions Precautions Precautions: Knee Precaution Booklet Issued: Yes (comment) Precaution Comments: hand out given and reviewed no pillow under knee Required Braces or Orthoses: Knee Immobilizer - Left Knee Immobilizer - Left: On except when in CPM Restrictions Weight Bearing Restrictions: Yes LLE Weight Bearing: Weight bearing as tolerated      Mobility  Bed Mobility Overal bed mobility: Needs Assistance Bed Mobility: Supine to Sit     Supine to sit: Min assist     General bed mobility comments: min A to bring LLE EOB  Transfers Overall transfer level: Needs assistance Equipment used: Rolling walker (2 wheeled) Transfers: Sit to/from [employer] Sit to Stand: Min assist Stand pivot transfers: Min assist       General transfer comment: Min A with cues for proper hand placement and to stabilize at trunk upon standing  Ambulation/Gait                Stairs            Wheelchair Mobility    Modified  Rankin (Stroke Patients Only)       Balance                                             Pertinent Vitals/Pain Pain Assessment: 0-10 Pain Score: 6  Pain Location: left knee Pain Descriptors / Indicators: Aching;Dull Pain Intervention(s): Limited activity within patient's tolerance;Monitored during session;Repositioned;Ice applied    Home Living Family/patient expects to be discharged to:: Private residence Living Arrangements: Spouse/significant other Available Help at Discharge: Family;Available 24 hours/day Type of Home: House Home Access: Ramped entrance;Stairs to enter Entrance Stairs-Rails: None Entrance Stairs-Number of Steps: 1 step into house Home Layout: One level Home Equipment: Bedside commode;Walker - 2 wheels;Shower seat - built in;Shower seat;Grab bars - toilet;Grab bars - tub/shower;Hand held shower head Additional Comments: going to stay with her mother right after surgery    Prior Function Level of Independence: Independent with assistive device(s);Independent         Comments: Used [place] PRN. Was able to perform all ADLs and IADLs. Was driving prior to surgery     Hand Dominance   Dominant Hand: Right    Extremity/Trunk Assessment   Upper Extremity Assessment Upper Extremity Assessment: Defer to OT evaluation    Lower Extremity Assessment Lower Extremity Assessment: LLE deficits/detail LLE Deficits / Details: pt with normal  post op pain and weakness. At least 3/5 ankle and 2/5 knee and hip per gross functional assessment.     Cervical / Trunk Assessment Cervical / Trunk Assessment: Normal  Communication   Communication: No difficulties  Cognition Arousal/Alertness: Awake/alert Behavior During Therapy: WFL for tasks assessed/performed Overall Cognitive Status: Within Functional Limits for tasks assessed                      General Comments      Exercises Total Joint Exercises Ankle Circles/Pumps: AROM;Both;20  reps;Supine Quad Sets: AROM;Left;10 reps;Supine   Assessment/Plan    PT Assessment Patient needs continued PT services  PT Problem List Decreased strength;Decreased range of motion;Decreased activity tolerance;Decreased balance;Decreased mobility;Decreased knowledge of use of DME;Pain          PT Treatment Interventions DME instruction;Gait training;Stair training;Functional mobility training;Therapeutic activities;Therapeutic exercise;Balance training;Patient/family education    PT Goals (Current goals can be found in the Care Plan section)  Acute Rehab PT Goals Patient Stated Goal: to get home  PT Goal Formulation: With patient Time For Goal Achievement: 12/03/16 Potential to Achieve Goals: Good    Frequency 7X/week   Barriers to discharge        Co-evaluation               End of Session Equipment Utilized During Treatment: Gait belt;Left knee immobilizer Activity Tolerance: Patient tolerated treatment well Patient left: in chair;with call bell/phone within reach;with family/visitor present Nurse Communication: Mobility status         Time: 0921-1001 PT Time Calculation (min) (ACUTE ONLY): 40 min   Charges:   PT Evaluation $PT Eval Moderate Complexity: 1 Procedure PT Treatments $Therapeutic Activity: 23-37 mins   PT G Codes:        Colin Broach PT, DPT  [phone]  11/26/2016, 2:30 PM

## 2016-11-26 NOTE — Progress Notes (Signed)
   Subjective: 1 Day Post-Op Procedure(s) (LRB): LEFT TOTAL KNEE ARTHROPLASTY (Left) Patient reports pain as moderate and severe.    Objective: Vital signs in last 24 hours: Temp:  [97.3 F (36.3 C)-98.1 F (36.7 C)] 98.1 F (36.7 C) (01/18 0500) Pulse Rate:  [74-101] 93 (01/18 0500) Resp:  [12-26] 17 (01/18 0500) BP: (95-112)/(51-64) 112/63 (01/18 0500) SpO2:  [97 %-100 %] 97 % (01/18 0500)  Intake/Output from previous day: 01/17 0701 - 01/18 0700 In: 1525 [P.O.:60; I.V.:1465] Out: 101 [Urine:1; Blood:100] Intake/Output this shift: No intake/output data recorded.   Recent Labs  11/26/16 0516  HGB 10.4*    Recent Labs  11/26/16 0516  WBC 12.5*  RBC 3.69*  HCT 32.8*  PLT 301    Recent Labs  11/26/16 0516  NA 136  K 4.4  CL 103  CO2 27  BUN 13  CREATININE 0.78  GLUCOSE 135*  CALCIUM 8.5*   No results for input(s): LABPT, INR in the last 72 hours.  Neurologically intact Dg Knee 1-2 Views Left  Result Date: 11/25/2016 CLINICAL DATA:  Left total knee replacement EXAM: LEFT KNEE - 1-2 VIEW COMPARISON:  None. FINDINGS: Two-view exam shows the patient to be status post tricompartmental knee replacement. No evidence for immediate complicating features. Gas in the soft tissues compatible with the immediate postoperative state. IMPRESSION: Status post total knee replacement. No evidence for immediate hardware complications. Electronically Signed   By: Kennith Center M.D.   On: 11/25/2016 14:56    Assessment/Plan: 1 Day Post-Op Procedure(s) (LRB): LEFT TOTAL KNEE ARTHROPLASTY (Left) Up with therapy SL IV  Dressing change.   Eldred Manges 11/26/2016, 11:05 AM

## 2016-11-26 NOTE — Anesthesia Postprocedure Evaluation (Signed)
Anesthesia Post Note  Patient: Laura Allen  Procedure(s) Performed: Procedure(s) (LRB): LEFT TOTAL KNEE ARTHROPLASTY (Left)  Patient location during evaluation: PACU Anesthesia Type: General Level of consciousness: oriented and awake and alert Pain management: pain level controlled Vital Signs Assessment: post-procedure vital signs reviewed and stable Respiratory status: spontaneous breathing, respiratory function stable and patient connected to nasal cannula oxygen Cardiovascular status: blood pressure returned to baseline and stable Postop Assessment: no headache and no backache Anesthetic complications: no       Last Vitals:  Vitals:   11/26/16 0100 11/26/16 0500  BP: (!) 105/56 112/63  Pulse: (!) 101 93  Resp: 17 17  Temp: 36.4 C 36.7 C    Last Pain:  Vitals:   11/26/16 0609  TempSrc:   PainSc: 7                  Saraiya Kozma S

## 2016-11-26 NOTE — Progress Notes (Signed)
Orthopedic Tech Progress Note Patient Details:  Laura Allen 1950/12/06 [id number]  Patient ID: Laura Allen, female   DOB: 1951-08-23, 66 y.o.   MRN: [id number] Applied cpm 0-55  Trinna Post 11/26/2016, 6:32 AM

## 2016-11-26 NOTE — Evaluation (Signed)
Occupational Therapy Evaluation Patient Details Name: Laura Allen MRN: [id number] DOB: [birth year] Today's Date: 11/26/2016    History of Present Illness 66 yo female admitted on 11/25/16 for left TKA. PMH significant for R TKA 2016, nontoxic goiter, hypothyroid, RA, HTN, sleep apnea.     Clinical Impression   PTA Pt independent in ADL and used SPC PRN for mobility. Pt currently mod assist for ADL and min guard for mobility with RW. Please see performance level below. Pt willing to work with therapy and very pleasant. Pt benefits from verbal praise during session for encouragement. Pt will benefit from skilled OT in the acute care setting prior to dc to venue below. Next session to focus on AE education and functional transfers surrounding ADL.     Follow Up Recommendations  No OT follow up;Supervision/Assistance - 24 hour (initially)    Equipment Recommendations  None recommended by OT    Recommendations for Other Services       Precautions / Restrictions Precautions Precautions: Knee Precaution Booklet Issued: Yes (comment) Precaution Comments: hand out given and reviewed no pillow under knee Required Braces or Orthoses: Knee Immobilizer - Left Knee Immobilizer - Left: On except when in CPM Restrictions Weight Bearing Restrictions: Yes LLE Weight Bearing: Weight bearing as tolerated      Mobility Bed Mobility Overal bed mobility: Needs Assistance Bed Mobility: Sit to Supine     Supine to sit: Min assist Sit to supine: Min assist   General bed mobility comments: min A to bring LLE back into bed  Transfers Overall transfer level: Needs assistance Equipment used: Rolling walker (2 wheeled) Transfers: Sit to/from Stand Sit to Stand: Min assist Stand pivot transfers: Min assist       General transfer comment: Min A with cues for proper hand placement and to stabilize at trunk upon standing    Balance Overall balance assessment: Needs  assistance Sitting-balance support: No upper extremity supported;Feet supported Sitting balance-Leahy Scale: Good Sitting balance - Comments: Sitting EOB no back support   Standing balance support: No upper extremity supported;During functional activity Standing balance-Leahy Scale: Fair Standing balance comment: hips resting on sink                            ADL Overall ADL's : Needs assistance/impaired Eating/Feeding: Modified independent;Sitting   Grooming: Wash/dry hands;Oral care;Standing Grooming Details (indicate cue type and reason): sink level ADL Upper Body Bathing: Supervision/ safety;Sitting   Lower Body Bathing: Moderate assistance;Sitting/lateral leans Lower Body Bathing Details (indicate cue type and reason): educated Pt on long handle sponge Upper Body Dressing : Set up;Sitting   Lower Body Dressing: Moderate assistance;Sit to/from stand   Toilet Transfer: Minimal assistance;Ambulation;RW (BSC over toilet) Toilet Transfer Details (indicate cue type and reason): vc for hand placement Toileting- Clothing Manipulation and Hygiene: Maximal assistance;Sit to/from stand Toileting - Clothing Manipulation Details (indicate cue type and reason): dual hospital gowns, Pt able to perform peri care Tub/ Shower Transfer: Walk-in shower;Min guard;With caregiver independent assisting;Ambulation;Rolling walker;Shower seat   Functional mobility during ADLs: Min guard;Rolling walker General ADL Comments: Pt enjoys verbal praise for accomplishments. Very very sweet.     Vision Vision Assessment?: No apparent visual deficits   Perception     Praxis      Pertinent Vitals/Pain Pain Assessment: 0-10 Pain Score: 5  Pain Location: left knee Pain Descriptors / Indicators: Aching;Dull Pain Intervention(s): Monitored during session;Repositioned;Ice applied     Hand Dominance Right  Extremity/Trunk Assessment Upper Extremity Assessment Upper Extremity Assessment:  Overall WFL for tasks assessed   Lower Extremity Assessment Lower Extremity Assessment: LLE deficits/detail LLE Deficits / Details: pt with normal post op pain and weakness.   Cervical / Trunk Assessment Cervical / Trunk Assessment: Normal   Communication Communication Communication: No difficulties   Cognition Arousal/Alertness: Awake/alert Behavior During Therapy: WFL for tasks assessed/performed Overall Cognitive Status: Within Functional Limits for tasks assessed                     General Comments       Exercises      Shoulder Instructions      Home Living Family/patient expects to be discharged to:: Private residence Living Arrangements: Spouse/significant other Available Help at Discharge: Family;Available 24 hours/day Type of Home: House Home Access: Ramped entrance;Stairs to enter Entrance Stairs-Number of Steps: 1 step into house Entrance Stairs-Rails: None Home Layout: One level     Bathroom Shower/Tub: Producer, television/film/video: Handicapped height Bathroom Accessibility: Yes How Accessible: Accessible via walker Home Equipment: Bedside commode;Walker - 2 wheels;Shower seat - built in;Shower seat;Grab bars - toilet;Grab bars - tub/shower;Hand held shower head   Additional Comments: going to stay with her mother right after surgery bc of handicapped access      Prior Functioning/Environment Level of Independence: Independent with assistive device(s);Independent        Comments: Used [place] PRN. Was able to perform all ADLs and IADLs. Was driving prior to surgery        OT Problem List: Decreased strength;Decreased range of motion;Decreased activity tolerance;Impaired balance (sitting and/or standing);Decreased knowledge of use of DME or AE;Decreased safety awareness;Pain   OT Treatment/Interventions:      OT Goals(Current goals can be found in the care plan section) Acute Rehab OT Goals Patient Stated Goal: to get home  OT Goal  Formulation: With patient Time For Goal Achievement: 12/03/16 Potential to Achieve Goals: Good ADL Goals Pt Will Perform Grooming: with supervision;standing Pt Will Perform Lower Body Bathing: with modified independence;sitting/lateral leans;with adaptive equipment Pt Will Perform Lower Body Dressing: with mod assist;sit to/from stand Pt Will Transfer to Toilet: with modified independence;ambulating (BSC over toilet with RW) Pt Will Perform Toileting - Clothing Manipulation and hygiene: with min guard assist;sit to/from stand  OT Frequency: Min 2X/week   Barriers to D/C:            Co-evaluation              End of Session Equipment Utilized During Treatment: Gait belt;Rolling walker CPM Left Knee CPM Left Knee: On Left Knee Flexion (Degrees):  (set by ortho tech after OT exited room, OT asssited in CPM) Nurse Communication: Mobility status;Weight bearing status  Activity Tolerance: Patient tolerated treatment well Patient left: in bed;with call bell/phone within reach   Time: 1539-1610 OT Time Calculation (min): 31 min Charges:  OT General Charges $OT Visit: 1 Procedure OT Evaluation $OT Eval Moderate Complexity: 1 Procedure OT Treatments $Self Care/Home Management : 8-22 mins G-Codes:    Evern Bio Anique Beckley 12-02-16, 5:38 PM Sherryl Manges OTR/L [phone]

## 2016-11-26 NOTE — Progress Notes (Signed)
PT Treatment Note:  Pt demonstrates improved tolerance for gait and bed mobs despite feeling nauseated earlier. Performed supine exercises before mobilizing and pt is able to improved gait distance this session. Pt continues to benefit from acute therapy to assist with safe transition home.     11/26/16 1545  PT Visit Information  Last PT Received On 11/26/16  Assistance Needed +1  History of Present Illness 66 yo female admitted on 11/25/16 for left TKA. PMH significant for R TKA 2016, nontoxic goiter, hypothyroid, RA, HTN, sleep apnea.    Subjective Data  Subjective pt states that she became nauseated earlier and vomitted, but is feeling better now.   Patient Stated Goal to get home   Precautions  Precautions Knee  Precaution Booklet Issued Yes (comment)  Precaution Comments hand out given and reviewed no pillow under knee  Required Braces or Orthoses Knee Immobilizer - Left  Knee Immobilizer - Left On except when in CPM  Restrictions  Weight Bearing Restrictions Yes  LLE Weight Bearing WBAT  Pain Assessment  Pain Assessment 0-10  Pain Score 5  Pain Location left knee  Pain Descriptors / Indicators Aching;Dull  Pain Intervention(s) Limited activity within patient's tolerance;Monitored during session;Premedicated before session;Repositioned;Ice applied  Cognition  Arousal/Alertness Awake/alert  Behavior During Therapy WFL for tasks assessed/performed  Overall Cognitive Status Within Functional Limits for tasks assessed  Bed Mobility  Overal bed mobility Needs Assistance  Bed Mobility Supine to Sit  Supine to sit Min assist  General bed mobility comments min A to bring LLE EOB  Transfers  Overall transfer level Needs assistance  Equipment used Rolling walker (2 wheeled)  Transfers Sit to/from Stand  Sit to Stand Min assist  General transfer comment Min A with cues for proper hand placement and to stabilize at trunk upon standing  Ambulation/Gait  Ambulation/Gait assistance  Min guard  Ambulation Distance (Feet) 20 Feet  Assistive device Rolling walker (2 wheeled)  Gait Pattern/deviations Step-to pattern;Decreased step length - right;Decreased stance time - left;Antalgic  General Gait Details Moderate antalgic gait, improved weight bearing through LLE as gait distance increases to commode  Gait velocity decreased  Gait velocity interpretation Below normal speed for age/gender  Balance  Overall balance assessment Needs assistance  Sitting-balance support No upper extremity supported;Feet supported  Sitting balance-Leahy Scale Good  Sitting balance - Comments Sitting EOB no back support  Standing balance support Bilateral upper extremity supported  Standing balance-Leahy Scale Poor  Standing balance comment relies on RW for stability  Exercises  Exercises Total Joint  Total Joint Exercises  Ankle Circles/Pumps AROM;Both;20 reps;Supine  Quad Sets AROM;Left;10 reps;Supine  Heel Slides AAROM;Left;10 reps;Supine  Goniometric ROM 5-70  PT - End of Session  Equipment Utilized During Treatment Gait belt;Left knee immobilizer  Activity Tolerance Patient tolerated treatment well  Patient left Other (comment) (with OT who was completing toileting)  Nurse Communication Mobility status  PT - Assessment/Plan  PT Plan Current plan remains appropriate  PT Frequency (ACUTE ONLY) 7X/week  Follow Up Recommendations Home health PT;Supervision for mobility/OOB  PT equipment None recommended by PT  PT Goal Progression  Progress towards PT goals Progressing toward goals  PT Time Calculation  PT Start Time (ACUTE ONLY) 1517  PT Stop Time (ACUTE ONLY) 1541  PT Time Calculation (min) (ACUTE ONLY) 24 min  PT General Charges  $$ ACUTE PT VISIT 1 Procedure  PT Treatments  $Gait Training 8-22 mins  $Therapeutic Exercise 8-22 mins   Colin Broach PT, DPT  [phone]  

## 2016-11-27 ENCOUNTER — Encounter (HOSPITAL_COMMUNITY): Payer: Self-pay | Admitting: Orthopaedic Surgery

## 2016-11-27 LAB — CBC
HEMATOCRIT: 32 % — AB (ref 36.0–46.0)
Hemoglobin: 10.2 g/dL — ABNORMAL LOW (ref 12.0–15.0)
MCH: 28.1 pg (ref 26.0–34.0)
MCHC: 31.9 g/dL (ref 30.0–36.0)
MCV: 88.2 fL (ref 78.0–100.0)
Platelets: 270 10*3/uL (ref 150–400)
RBC: 3.63 MIL/uL — ABNORMAL LOW (ref 3.87–5.11)
RDW: 15.6 % — ABNORMAL HIGH (ref 11.5–15.5)
WBC: 14.7 10*3/uL — ABNORMAL HIGH (ref 4.0–10.5)

## 2016-11-27 MED ORDER — METHOCARBAMOL 500 MG PO TABS: 500.0000 mg | ORAL_TABLET | Freq: Four times a day (QID) | ORAL | 0 refills | Status: DC | PRN

## 2016-11-27 MED ORDER — OXYCODONE-ACETAMINOPHEN 5-325 MG PO TABS: 1.0000 | ORAL_TABLET | Freq: Four times a day (QID) | ORAL | 0 refills | Status: DC | PRN

## 2016-11-27 MED ORDER — ASPIRIN 325 MG PO TBEC: 325.0000 mg | DELAYED_RELEASE_TABLET | Freq: Every day | ORAL | 0 refills | Status: DC

## 2016-11-27 NOTE — Progress Notes (Signed)
Physical Therapy Treatment Patient Details Name: Laura Allen MRN: [id number] DOB: Mar 26, 1951 Today's Date: 11/27/2016    History of Present Illness 66 yo female admitted on 11/25/16 for left TKA. PMH significant for R TKA 2016, nontoxic goiter, hypothyroid, RA, HTN, sleep apnea.      PT Comments    Pt is POD 2 and moving well with therapy. Increased gait distance and pt requires decreased assistance to perform transfers and toileting this session. Pt will still need to perform stair negotiation prior to discharge home.    Follow Up Recommendations  Home health PT;Supervision for mobility/OOB     Equipment Recommendations  None recommended by PT    Recommendations for Other Services       Precautions / Restrictions Precautions Precautions: Knee Precaution Booklet Issued: Yes (comment) Precaution Comments: hand out given and reviewed no pillow under knee Required Braces or Orthoses: Knee Immobilizer - Left Knee Immobilizer - Left: On except when in CPM Restrictions Weight Bearing Restrictions: Yes LLE Weight Bearing: Weight bearing as tolerated    Mobility  Bed Mobility Overal bed mobility: Needs Assistance Bed Mobility: Supine to Sit     Supine to sit: Min assist     General bed mobility comments: min A to bring LLE back into bed  Transfers Overall transfer level: Needs assistance Equipment used: Rolling walker (2 wheeled) Transfers: Sit to/from Stand Sit to Stand: Min guard         General transfer comment: Min guard and cues for hand placement from EOB and Commode  Ambulation/Gait Ambulation/Gait assistance: Min guard Ambulation Distance (Feet): 50 Feet Assistive device: Rolling walker (2 wheeled) Gait Pattern/deviations: Step-to pattern;Decreased step length - right;Decreased stance time - left;Antalgic Gait velocity: decreased Gait velocity interpretation: Below normal speed for age/gender General Gait Details: Moderate antalgic gait, improved  weight bearing through LLE as gait distance increases to commode   Stairs            Wheelchair Mobility    Modified Rankin (Stroke Patients Only)       Balance                                    Cognition Arousal/Alertness: Awake/alert Behavior During Therapy: WFL for tasks assessed/performed Overall Cognitive Status: Within Functional Limits for tasks assessed                      Exercises Total Joint Exercises Hip ABduction/ADduction: AAROM;Left;10 reps;Supine Straight Leg Raises: AAROM;Left;10 reps;Supine    General Comments        Pertinent Vitals/Pain Pain Assessment: 0-10 Pain Score: 4  Pain Location: left knee Pain Descriptors / Indicators: Aching;Dull Pain Intervention(s): Monitored during session;Premedicated before session;Ice applied    Home Living                      Prior Function            PT Goals (current goals can now be found in the care plan section) Acute Rehab PT Goals Patient Stated Goal: to get home  Progress towards PT goals: Progressing toward goals    Frequency    7X/week      PT Plan Current plan remains appropriate    Co-evaluation             End of Session Equipment Utilized During Treatment: Gait belt;Left knee immobilizer Activity Tolerance: Patient tolerated treatment  well Patient left: in bed;with call bell/phone within reach;in CPM;with family/visitor present;with SCD's reapplied     Time: 7989-2119 PT Time Calculation (min) (ACUTE ONLY): 41 min  Charges:  $Gait Training: 23-37 mins $Therapeutic Exercise: 8-22 mins                    G Codes:      Colin Broach PT, DPT  [phone]  11/27/2016, 12:55 PM

## 2016-11-27 NOTE — Discharge Instructions (Signed)
INSTRUCTIONS AFTER TOTAL KNEE JOINT REPLACEMENT  ° °o Remove items at home which could result in a fall. This includes throw rugs or furniture in walking pathways °o ICE to the affected joint every three hours while awake for 30 minutes at a time, for at least the first 3-5 days, and then as needed for pain and swelling.  Continue to use ice for pain and swelling. You may notice swelling that will progress down to the foot and ankle.  This is normal after surgery.  Elevate your leg when you are not up walking on it.   °o Continue to use the breathing machine you got in the hospital (incentive spirometer) which will help keep your temperature down.  It is common for your temperature to cycle up and down following surgery, especially at night when you are not up moving around and exerting yourself.  The breathing machine keeps your lungs expanded and your temperature down. ° ° °DIET:  As you were doing prior to hospitalization, we recommend a well-balanced diet. ° °DRESSING / WOUND CARE / SHOWERING ° °You may change your dressing 3-5 days after surgery.  Then change the dressing every day with sterile gauze.  Please use good hand washing techniques before changing the dressing.  Do not use any lotions or creams on the incision until instructed by your surgeon. ° °ACTIVITY ° °o Increase activity slowly as tolerated, but follow the weight bearing instructions below.   °o No driving for 6 weeks or until further direction given by your physician.  You cannot drive while taking narcotics.  °o No lifting or carrying greater than 10 lbs. until further directed by your surgeon. °o Avoid periods of inactivity such as sitting longer than an hour when not asleep. This helps prevent blood clots.  °o You may return to work once you are authorized by your doctor.  ° ° ° °WEIGHT BEARING  ° °Weight bearing as tolerated with assist device (walker, cane, etc) as directed, use it as long as suggested by your surgeon or therapist,  typically at least 4-6 weeks. ° ° °EXERCISES ° °Results after joint replacement surgery are often greatly improved when you follow the exercise, range of motion and muscle strengthening exercises prescribed by your doctor. Safety measures are also important to protect the joint from further injury. Any time any of these exercises cause you to have increased pain or swelling, decrease what you are doing until you are comfortable again and then slowly increase them. If you have problems or questions, call your caregiver or physical therapist for advice.  ° °Rehabilitation is important following a joint replacement. After just a few days of immobilization, the muscles of the leg can become weakened and shrink (atrophy).  These exercises are designed to build up the tone and strength of the thigh and leg muscles and to improve motion. Often times heat used for twenty to thirty minutes before working out will loosen up your tissues and help with improving the range of motion but do not use heat for the first two weeks following surgery (sometimes heat can increase post-operative swelling).  ° °These exercises can be done on a training (exercise) mat, on the floor, on a table or on a bed. Use whatever works the best and is most comfortable for you.    Use music or television while you are exercising so that the exercises are a pleasant break in your day. This will make your life better with the exercises acting as   a break in your routine that you can look forward to.   Perform all exercises about fifteen times, three times per day or as directed.  You should exercise both the operative leg and the other leg as well. ° °Exercises include: °  °• Quad Sets - Tighten up the muscle on the front of the thigh (Quad) and hold for 5-10 seconds.   °• Straight Leg Raises - With your knee straight (if you were given a brace, keep it on), lift the leg to 60 degrees, hold for 3 seconds, and slowly lower the leg.  Perform this exercise  against resistance later as your leg gets stronger.  °• Leg Slides: Lying on your back, slowly slide your foot toward your buttocks, bending your knee up off the floor (only go as far as is comfortable). Then slowly slide your foot back down until your leg is flat on the floor again.  °• Angel Wings: Lying on your back spread your legs to the side as far apart as you can without causing discomfort.  °• Hamstring Strength:  Lying on your back, push your heel against the floor with your leg straight by tightening up the muscles of your buttocks.  Repeat, but this time bend your knee to a comfortable angle, and push your heel against the floor.  You may put a pillow under the heel to make it more comfortable if necessary.  ° °A rehabilitation program following joint replacement surgery can speed recovery and prevent re-injury in the future due to weakened muscles. Contact your doctor or a physical therapist for more information on knee rehabilitation.  ° ° °CONSTIPATION ° °Constipation is defined medically as fewer than three stools per week and severe constipation as less than one stool per week.  Even if you have a regular bowel pattern at home, your normal regimen is likely to be disrupted due to multiple reasons following surgery.  Combination of anesthesia, postoperative narcotics, change in appetite and fluid intake all can affect your bowels.  ° °YOU MUST use at least one of the following options; they are listed in order of increasing strength to get the job done.  They are all available over the counter, and you may need to use some, POSSIBLY even all of these options:   ° °Drink plenty of fluids (prune juice may be helpful) and high fiber foods °Colace 100 mg by mouth twice a day  °Senokot for constipation as directed and as needed Dulcolax (bisacodyl), take with full glass of water  °Miralax (polyethylene glycol) once or twice a day as needed. ° °If you have tried all these things and are unable to have a  bowel movement in the first 3-4 days after surgery call either your surgeon or your primary doctor.   ° °If you experience loose stools or diarrhea, hold the medications until you stool forms back up.  If your symptoms do not get better within 1 week or if they get worse, check with your doctor.  If you experience "the worst abdominal pain ever" or develop nausea or vomiting, please contact the office immediately for further recommendations for treatment. ° ° °ITCHING:  If you experience itching with your medications, try taking only a single pain pill, or even half a pain pill at a time.  You can also use Benadryl over the counter for itching or also to help with sleep.  ° °TED HOSE STOCKINGS:  Use stockings on both legs until for at least 2 weeks   or as directed by physician office. They may be removed at night for sleeping. ° °MEDICATIONS:  See your medication summary on the “After Visit Summary” that nursing will review with you.  You may have some home medications which will be placed on hold until you complete the course of blood thinner medication.  It is important for you to complete the blood thinner medication as prescribed. ° °PRECAUTIONS:  If you experience chest pain or shortness of breath - call 911 immediately for transfer to the hospital emergency department.  ° °If you develop a fever greater that 101 F, purulent drainage from wound, increased redness or drainage from wound, foul odor from the wound/dressing, or calf pain - CONTACT YOUR SURGEON.   °                                                °FOLLOW-UP APPOINTMENTS:  If you do not already have a post-op appointment, please call the office for an appointment to be seen by your surgeon.  Guidelines for how soon to be seen are listed in your “After Visit Summary”, but are typically between 1-4 weeks after surgery. ° °OTHER INSTRUCTIONS:  ° °Knee Replacement:  Do not place pillow under knee, focus on keeping the knee straight while resting. CPM  instructions: 0-90 degrees, 2 hours in the morning, 2 hours in the afternoon, and 2 hours in the evening. Place foam block, curve side up under heel at all times except when in CPM or when walking.  DO NOT modify, tear, cut, or change the foam block in any way. ° °MAKE SURE YOU:  °• Understand these instructions.  °• Get help right away if you are not doing well or get worse.  ° ° °Thank you for letting us be a part of your medical care team.  It is a privilege we respect greatly.  We hope these instructions will help you stay on track for a fast and full recovery!  ° °

## 2016-11-27 NOTE — Progress Notes (Signed)
PT Treatment Note:  Pt presents with improved mobility. Performed step negotiation to mimic going into house when she gets to her mothers home. Pt is able to perform with minimal cueing. Pt is eager to return home today. Reviewed HEP and advised pt to continue exercises until HHPT follows up.      11/27/16 1428  PT Visit Information  Last PT Received On 11/27/16  Assistance Needed +1  History of Present Illness 66 yo female admitted on 11/25/16 for left TKA. PMH significant for R TKA 2016, nontoxic goiter, hypothyroid, RA, HTN, sleep apnea.    Subjective Data  Subjective pt states she is ready to go home today  Patient Stated Goal to get home   Precautions  Precautions Knee  Precaution Booklet Issued Yes (comment)  Precaution Comments hand out given and reviewed no pillow under knee  Required Braces or Orthoses Knee Immobilizer - Left  Knee Immobilizer - Left On except when in CPM  Restrictions  Weight Bearing Restrictions Yes  LLE Weight Bearing WBAT  Pain Assessment  Pain Assessment 0-10  Pain Score 4  Pain Location left knee  Pain Descriptors / Indicators Aching;Dull  Pain Intervention(s) Limited activity within patient's tolerance;Monitored during session;Premedicated before session;Ice applied  Cognition  Arousal/Alertness Awake/alert  Behavior During Therapy WFL for tasks assessed/performed  Overall Cognitive Status Within Functional Limits for tasks assessed  Bed Mobility  General bed mobility comments Going to bathroom with NT when PT arrives  Transfers  Overall transfer level Needs assistance  Equipment used Rolling walker (2 wheeled)  Transfers Sit to/from Stand  Sit to Stand Min guard  General transfer comment Min guard and cues for hand placement from Commode  Ambulation/Gait  Ambulation/Gait assistance Min guard  Ambulation Distance (Feet) 50 Feet  Assistive device Rolling walker (2 wheeled)  Gait Pattern/deviations Step-to pattern;Decreased step length -  right;Decreased stance time - left;Antalgic  General Gait Details Moderate antalgic gait, improved weight bearing through LLE as gait distance increases to commode  Gait velocity decreased  Gait velocity interpretation Below normal speed for age/gender  Stairs Yes  Stairs assistance Min guard  Stair Management No rails;With walker  Number of Stairs 1  General stair comments Cues for sequencing, min guard for safety  PT - Assessment/Plan  PT Plan Current plan remains appropriate  PT Frequency (ACUTE ONLY) 7X/week  Follow Up Recommendations Home health PT;Supervision for mobility/OOB  PT equipment None recommended by PT  PT Goal Progression  Progress towards PT goals Progressing toward goals  PT Time Calculation  PT Start Time (ACUTE ONLY) 1406  PT Stop Time (ACUTE ONLY) 1426  PT Time Calculation (min) (ACUTE ONLY) 20 min  PT General Charges  $$ ACUTE PT VISIT 1 Procedure  PT Treatments  $Gait Training 8-22 mins   Colin Broach PT, DPT  [phone]

## 2016-11-27 NOTE — Progress Notes (Signed)
Patient was discharged home around 1800H, accompanied by her husband with no apparent distress noted. Medications and discharge instructions explained to the patient. She verbalized understanding. Copies given to her including original prescriptions. IV saline lock removed.

## 2016-11-27 NOTE — Progress Notes (Signed)
OT Cancellation Note  Patient Details Name: Laura Allen MRN: [id number] DOB: 1951-10-25   Cancelled Treatment:    Reason Eval/Treat Not Completed: Patient declined, no reason specified. Pt sitting OOB in chair fully dressed and ready to go home. OT offered practice with AE or transfers for ADL and Pt declined at this time stating that she was saving her strength and pain tolerance for the journey home. Pt and husband had no further questions or concerns for OT.   Evern Bio Kalonji Zurawski 11/27/2016, 3:57 PM  Sherryl Manges OTR/L [phone]

## 2016-11-27 NOTE — Progress Notes (Signed)
   Subjective: 2 Days Post-Op Procedure(s) (LRB): LEFT TOTAL KNEE ARTHROPLASTY (Left) Patient reports pain as moderate.    Objective: Vital signs in last 24 hours: Temp:  [98.1 F (36.7 C)-99.5 F (37.5 C)] 99.5 F (37.5 C) (01/18 2209) Pulse Rate:  [96-111] 111 (01/18 2209) Resp:  [16-18] 16 (01/18 2209) BP: (124-143)/(50-84) 143/84 (01/18 2209) SpO2:  [92 %-98 %] 92 % (01/18 2209)  Intake/Output from previous day: 01/18 0701 - 01/19 0700 In: 570 [P.O.:570] Out: 3 [Urine:3] Intake/Output this shift: No intake/output data recorded.   Recent Labs  11/26/16 0516 11/27/16 0455  HGB 10.4* 10.2*    Recent Labs  11/26/16 0516 11/27/16 0455  WBC 12.5* 14.7*  RBC 3.69* 3.63*  HCT 32.8* 32.0*  PLT 301 270    Recent Labs  11/26/16 0516  NA 136  K 4.4  CL 103  CO2 27  BUN 13  CREATININE 0.78  GLUCOSE 135*  CALCIUM 8.5*   No results for input(s): LABPT, INR in the last 72 hours.  Neurologically intact No results found.  Assessment/Plan: 2 Days Post-Op Procedure(s) (LRB): LEFT TOTAL KNEE ARTHROPLASTY (Left) Up with therapy, home Sat with HHPT  Eldred Manges 11/27/2016, 10:31 AM

## 2016-11-27 NOTE — Care Management Note (Signed)
Case Management Note  Patient Details  Name: Laura Allen MRN: [id number] Date of Birth: 12/06/1950  Subjective/Objective: 66 yr old female s/p left total knee arthroplasty.                   Action/Plan: Case manager spoke with patient concerning Home Health and DME needs. Choice was offered for Home Health agency. Referral was called to Janeice Robinson, [facility] Liaison. Patient states she has rolling walker and 3in1. She will go to her mother's home for recovery: [address], [place], [phone]. CM provided this information to Clydie Braun.   Expected Discharge Date:    11/28/16              Expected Discharge Plan:  Home w Home Health Services  In-House Referral:  NA  Discharge planning Services  CM Consult  Post Acute Care Choice:  Home Health Choice offered to:  Patient  DME Arranged:  N/A (has all DME ) DME Agency:  NA  HH Arranged:  PT HH Agency:  [facility]  Status of Service:     If discussed at Long Length of Stay Meetings, dates discussed:    Additional Comments:  Durenda Guthrie, RN 11/27/2016, 11:23 AM

## 2016-12-01 ENCOUNTER — Encounter (HOSPITAL_COMMUNITY): Payer: Self-pay | Admitting: Orthopaedic Surgery

## 2016-12-01 NOTE — Addendum Note (Signed)
Addendum  created 12/01/16 0053 by Eilene Ghazi, MD   SmartForm saved

## 2016-12-07 ENCOUNTER — Telehealth (INDEPENDENT_AMBULATORY_CARE_PROVIDER_SITE_OTHER): Payer: Self-pay | Admitting: Orthopaedic Surgery

## 2016-12-07 NOTE — Telephone Encounter (Signed)
Phys therapist needs a referral for pt for outpt phys therapy, please fax to [phone]  [facility]   Pt Wants Thursday morning appt  Call [phone] debbie for any questions

## 2016-12-08 NOTE — Telephone Encounter (Signed)
Patient had total knee arthroplasty on 11/25/2016. She has not had first post op follow up (it is scheduled for 12/10/2016 in [place]). Do you want her to hold off on outpatient physical therapy until she sees you in office?

## 2016-12-08 NOTE — Telephone Encounter (Signed)
Yes OK. thanks

## 2016-12-09 ENCOUNTER — Telehealth (INDEPENDENT_AMBULATORY_CARE_PROVIDER_SITE_OTHER): Payer: Self-pay | Admitting: Orthopaedic Surgery

## 2016-12-09 NOTE — Telephone Encounter (Signed)
Erie Noe w/[facility] needs physical therapy orders for post op patient. She is being discharged from home health today and has appt tomorrow with them @11am . Also needs the demographic sheet. Please fax  [phone].

## 2016-12-09 NOTE — Telephone Encounter (Signed)
Faxed

## 2016-12-09 NOTE — Telephone Encounter (Signed)
Note    Physical Therapist Dyanne Iha calling regarding this message:  Laura Allen w/[facility] needs physical therapy orders for post op patient. She is being discharged from home health today and has appt tomorrow with them @11am . Also needs the demographic sheet. Please fax  [phone].

## 2016-12-10 ENCOUNTER — Ambulatory Visit (INDEPENDENT_AMBULATORY_CARE_PROVIDER_SITE_OTHER): Payer: BC Managed Care – PPO | Admitting: Orthopaedic Surgery

## 2016-12-10 ENCOUNTER — Encounter (INDEPENDENT_AMBULATORY_CARE_PROVIDER_SITE_OTHER): Payer: Self-pay | Admitting: Orthopaedic Surgery

## 2016-12-10 ENCOUNTER — Ambulatory Visit (INDEPENDENT_AMBULATORY_CARE_PROVIDER_SITE_OTHER): Payer: Self-pay

## 2016-12-10 VITALS — BP 113/73 | HR 109 | Ht 67.0 in | Wt 230.0 lb

## 2016-12-10 DIAGNOSIS — M1712 Unilateral primary osteoarthritis, left knee: Secondary | ICD-10-CM

## 2016-12-10 DIAGNOSIS — Z96652 Presence of left artificial knee joint: Secondary | ICD-10-CM

## 2016-12-10 NOTE — Progress Notes (Signed)
Post-Op Visit Note   Patient: Laura Allen           Date of Birth: 05/20/1951           MRN: [id number] Visit Date: 12/10/2016 PCP: Colette Ribas, MD   Assessment & Plan:  Chief Complaint:  Chief Complaint  Patient presents with  . Left Knee - Routine Post Op   Visit Diagnoses:  1. Primary osteoarthritis of left knee   2. Status post total left knee replacement     Plan: Term 4 weeks she has full extension and flexes 125 is ambulatory with a cane and can almost do a straight leg raise. She'll continue to work on Dance movement psychotherapist. Her flexion and extension are excellent.  Follow-Up Instructions: Return in about 4 weeks (around 01/07/2017).   Orders:  Orders Placed This Encounter  Procedures  . XR Knee 1-2 Views Left   No orders of the defined types were placed in this encounter.  HPI Patient presents for first post op visit status post left total knee arthroplasty on 11/25/2016. She is 2 weeks 1 day post op. She is doing well. HHPT is complete and she begins outpatient physical therapy today. She is ambulating with a cane. She is taking Oxycodone and Robaxin as needed.   Imaging: Xr Knee 1-2 Views Left  Result Date: 12/10/2016 [facility] and lateral left knee obtained which shows good position alignment of total knee arthroplasty good cement mantle. Assessment: Status post left total knee arthroplasty satisfactory x-ray appearance.   PMFS History: Patient Active Problem List   Diagnosis Date Noted  . Left knee DJD 11/25/2016  . Nontoxic multinodular goiter 03/19/2016  . Primary hypothyroidism 09/11/2015  . Status post total right knee replacement 05/20/2015  . Rheumatic joint disease 12/03/2011  . Primary osteoarthritis of left knee 12/03/2011   Past Medical History:  Diagnosis Date  . Anemia    PMH  . Bronchitis    PMH  . Family history of adverse reaction to anesthesia    mother gets PONV  . H/O exercise stress test    [facility]  . H/O  seasonal allergies   . Hearing aid worn    B/L  . History of underactive thyroid   . Hypertension   . Hypothyroidism   . Osteoarthritis (arthritis due to wear and tear of joints)   . Pancreatitis   . Rheumatoid arthritis(714.0)   . Sleep apnea    mild; does not wear CPAP  . Wears glasses     Family History  Problem Relation Age of Onset  . Arthritis    . Cancer    . Diabetes      Past Surgical History:  Procedure Laterality Date  . CATARACT EXTRACTION W/PHACO Left 09/04/2013   Procedure: CATARACT EXTRACTION PHACO AND INTRAOCULAR LENS PLACEMENT (IOC);  Surgeon: Gemma Payor, MD;  Location: [facility];  Service: Ophthalmology;  Laterality: Left;  CDE:  10.22  . CATARACT EXTRACTION W/PHACO Right 09/14/2013   Procedure: CATARACT EXTRACTION PHACO AND INTRAOCULAR LENS PLACEMENT (IOC);  Surgeon: Gemma Payor, MD;  Location: [facility];  Service: Ophthalmology;  Laterality: Right;  CDE:8.81  . CHOLECYSTECTOMY    . COLONOSCOPY    . KNEE ARTHROPLASTY Right 05/20/2015   Procedure: COMPUTER ASSISTED TOTAL KNEE ARTHROPLASTY;  Surgeon: Eldred Manges, MD;  Location: [facility] OR;  Service: Orthopedics;  Laterality: Right;  . OVARIAN CYST SURGERY    . TOTAL KNEE ARTHROPLASTY Left 11/25/2016   Procedure: LEFT TOTAL KNEE ARTHROPLASTY;  Surgeon: Eldred Manges, MD;  Location: [facility] OR;  Service: Orthopedics;  Laterality: Left;   Social History   Occupational History  . Not on file.   Social History Main Topics  . Smoking status: Never Smoker  . Smokeless tobacco: Never Used  . Alcohol use No  . Drug use: No  . Sexual activity: Yes    Birth control/ protection: Post-menopausal

## 2016-12-10 NOTE — Telephone Encounter (Signed)
All info sent

## 2016-12-17 NOTE — Discharge Summary (Signed)
Patient ID: Laura Allen MRN: [id number] DOB/AGE: 1951-06-13 66 y.o.  Admit date: 11/25/2016 Discharge date: 12/17/2016  Admission Diagnoses:  Active Problems:   Left knee DJD   Discharge Diagnoses:  Active Problems:   Left knee DJD  status post Procedure(s): LEFT TOTAL KNEE ARTHROPLASTY  Past Medical History:  Diagnosis Date  . Anemia    PMH  . Bronchitis    PMH  . Family history of adverse reaction to anesthesia    mother gets PONV  . H/O exercise stress test    [facility]  . H/O seasonal allergies   . Hearing aid worn    B/L  . History of underactive thyroid   . Hypertension   . Hypothyroidism   . Osteoarthritis (arthritis due to wear and tear of joints)   . Pancreatitis   . Rheumatoid arthritis(714.0)   . Sleep apnea    mild; does not wear CPAP  . Wears glasses     Surgeries: Procedure(s): LEFT TOTAL KNEE ARTHROPLASTY on 11/25/2016   Consultants:   Discharged Condition: Improved  Hospital Course: Laura Allen is an 66 y.o. female who was admitted 11/25/2016 for operative treatment of knee djd. Patient failed conservative treatments (please see the history and physical for the specifics) and had severe unremitting pain that affects sleep, daily activities and work/hobbies. After pre-op clearance, the patient was taken to the operating room on 11/25/2016 and underwent  Procedure(s): LEFT TOTAL KNEE ARTHROPLASTY.    Patient was given perioperative antibiotics:  Anti-infectives    Start     Dose/Rate Route Frequency Ordered Stop   11/25/16 1000  ceFAZolin (ANCEF) IVPB 2g/100 mL premix     2 g 200 mL/hr over 30 Minutes Intravenous On call to O.R. 11/25/16 1000 11/25/16 1233       Patient was given sequential compression devices and early ambulation to prevent DVT.   Patient benefited maximally from hospital stay and there were no complications. At the time of discharge, the patient was urinating/moving their bowels without  difficulty, tolerating a regular diet, pain is controlled with oral pain medications and they have been cleared by PT/OT.   Recent vital signs: No data found.    Recent laboratory studies: No results for input(s): WBC, HGB, HCT, PLT, NA, K, CL, CO2, BUN, CREATININE, GLUCOSE, INR, CALCIUM in the last 72 hours.  Invalid input(s): PT, 2   Discharge Medications:   Allergies as of 11/27/2016      Reactions   Ibuprofen Swelling   Can take in small doses.  Large doses cause swelling.    Levofloxacin Hives, Rash   Streptomycin Hives, Rash      Medication List    STOP taking these medications   acetaminophen 325 MG tablet Commonly known as:  TYLENOL   traMADol 50 MG tablet Commonly known as:  ULTRAM     TAKE these medications   aspirin 325 MG EC tablet Take 1 tablet (325 mg total) by mouth daily with breakfast.   calcium carbonate 600 MG Tabs tablet Commonly known as:  OS-CAL Take 600 mg by mouth daily.   cholecalciferol 1000 units tablet Commonly known as:  VITAMIN D Take 1,000 Units by mouth daily.   diltiazem 120 MG 12 hr capsule Commonly known as:  CARDIZEM SR Take 120 mg by mouth daily.   folic acid 1 MG tablet Commonly known as:  FOLVITE Take 1 mg by mouth daily.   HUMIRA 40 MG/0.8ML injection Generic drug:  adalimumab Inject  40 mg into the skin every 14 (fourteen) days. Has stopped prior to procedure   levothyroxine 100 MCG tablet Commonly known as:  SYNTHROID, LEVOTHROID Take 1 tablet (100 mcg total) by mouth daily before breakfast.   loratadine 10 MG tablet Commonly known as:  CLARITIN Take 10 mg by mouth daily as needed for allergies.   methocarbamol 500 MG tablet Commonly known as:  ROBAXIN Take 1 tablet (500 mg total) by mouth every 6 (six) hours as needed for muscle spasms. What changed:  when to take this  reasons to take this   methotrexate 2.5 MG tablet Commonly known as:  RHEUMATREX Take 10 mg by mouth 2 (two) times a week.  Caution:Chemotherapy. Protect from light. On Thursdays and Fridays Has stopped prior to procedure   oxyCODONE-acetaminophen 5-325 MG tablet Commonly known as:  ROXICET Take 1-2 tablets by mouth every 6 (six) hours as needed for severe pain.   Potassium Gluconate 550 MG Tabs Take 550 mg by mouth daily.   predniSONE 10 MG tablet Commonly known as:  DELTASONE Take 10 mg by mouth daily as needed.   predniSONE 5 MG tablet Commonly known as:  DELTASONE Take 5 mg by mouth daily as needed (for rheumatoid arthritis flare ups). For rheumatoid arthritis       Diagnostic Studies: Dg Knee 1-2 Views Left  Result Date: 11/25/2016 CLINICAL DATA:  Left total knee replacement EXAM: LEFT KNEE - 1-2 VIEW COMPARISON:  None. FINDINGS: Two-view exam shows the patient to be status post tricompartmental knee replacement. No evidence for immediate complicating features. Gas in the soft tissues compatible with the immediate postoperative state. IMPRESSION: Status post total knee replacement. No evidence for immediate hardware complications. Electronically Signed   By: Kennith Center M.D.   On: 11/25/2016 14:56   Xr Knee 1-2 Views Left  Result Date: 12/10/2016 [facility] and lateral left knee obtained which shows good position alignment of total knee arthroplasty good cement mantle. Assessment: Status post left total knee arthroplasty satisfactory x-ray appearance.     Follow-up Information    [facility] Follow up.   Why:  Someone from [facility] will contact you to arrange start date and time for therapy. Contact information: [address] [place] [address] [phone]        Eldred Manges, MD. Schedule an appointment as soon as possible for a visit today.   Specialty:  Orthopedic Surgery Why:  need return office 2 weeks postop.  Contact information: [address] [place] [address] [phone]           Discharge Plan:  discharge to  home  Disposition:     Signed: Zonia Kief  12/17/2016, 9:24 AM

## 2017-01-07 ENCOUNTER — Encounter (INDEPENDENT_AMBULATORY_CARE_PROVIDER_SITE_OTHER): Payer: Self-pay | Admitting: Orthopaedic Surgery

## 2017-01-07 ENCOUNTER — Ambulatory Visit (INDEPENDENT_AMBULATORY_CARE_PROVIDER_SITE_OTHER): Payer: BC Managed Care – PPO | Admitting: Orthopaedic Surgery

## 2017-01-07 VITALS — BP 110/79 | HR 109 | Ht 67.0 in | Wt 225.0 lb

## 2017-01-07 DIAGNOSIS — Z96652 Presence of left artificial knee joint: Secondary | ICD-10-CM

## 2017-01-07 NOTE — Progress Notes (Signed)
Post-Op Visit Note   Patient: Laura Allen           Date of Birth: 1951/06/26           MRN: [id number] Visit Date: 01/07/2017 PCP: Colette Ribas, MD   Assessment & Plan: Os left total knee arthroplasty. She has full extension and flexes to 130 plus. Quad strength is good he is inflammatory with a cane and is making excellent progress.  Chief Complaint:  Chief Complaint  Patient presents with  . Left Knee - Routine Post Op   Visit Diagnoses:  1. S/P total knee arthroplasty, left     Plan: Incision is healed nicely with subcuticular closure. I'll check her again for likely final visit 5 weeks she relates that she is walking a lot and not even using the cane.  Follow-Up Instructions: Return in about 5 weeks (around 02/11/2017).   Orders:  No orders of the defined types were placed in this encounter.  No orders of the defined types were placed in this encounter.  HPI Patient returns for follow up status post left total knee arthroplasty on 11/25/2016. She is not having to take anything for pain. She thinks that she is ready to discontinue physical therapy.   Imaging: No results found.  PMFS History: Patient Active Problem List   Diagnosis Date Noted  . Left knee DJD 11/25/2016  . Nontoxic multinodular goiter 03/19/2016  . Primary hypothyroidism 09/11/2015  . Status post total right knee replacement 05/20/2015  . Rheumatic joint disease 12/03/2011  . Primary osteoarthritis of left knee 12/03/2011   Past Medical History:  Diagnosis Date  . Anemia    PMH  . Bronchitis    PMH  . Family history of adverse reaction to anesthesia    mother gets PONV  . H/O exercise stress test    [facility]  . H/O seasonal allergies   . Hearing aid worn    B/L  . History of underactive thyroid   . Hypertension   . Hypothyroidism   . Osteoarthritis (arthritis due to wear and tear of joints)   . Pancreatitis   . Rheumatoid arthritis(714.0)   . Sleep apnea      mild; does not wear CPAP  . Wears glasses     Family History  Problem Relation Age of Onset  . Arthritis    . Cancer    . Diabetes      Past Surgical History:  Procedure Laterality Date  . CATARACT EXTRACTION W/PHACO Left 09/04/2013   Procedure: CATARACT EXTRACTION PHACO AND INTRAOCULAR LENS PLACEMENT (IOC);  Surgeon: Gemma Payor, MD;  Location: [facility];  Service: Ophthalmology;  Laterality: Left;  CDE:  10.22  . CATARACT EXTRACTION W/PHACO Right 09/14/2013   Procedure: CATARACT EXTRACTION PHACO AND INTRAOCULAR LENS PLACEMENT (IOC);  Surgeon: Gemma Payor, MD;  Location: [facility];  Service: Ophthalmology;  Laterality: Right;  CDE:8.81  . CHOLECYSTECTOMY    . COLONOSCOPY    . KNEE ARTHROPLASTY Right 05/20/2015   Procedure: COMPUTER ASSISTED TOTAL KNEE ARTHROPLASTY;  Surgeon: Eldred Manges, MD;  Location: [facility];  Service: Orthopedics;  Laterality: Right;  . OVARIAN CYST SURGERY    . TOTAL KNEE ARTHROPLASTY Left 11/25/2016   Procedure: LEFT TOTAL KNEE ARTHROPLASTY;  Surgeon: Eldred Manges, MD;  Location: [facility];  Service: Orthopedics;  Laterality: Left;   Social History   Occupational History  . Not on file.   Social History Main Topics  . Smoking status: Never Smoker  .  Smokeless tobacco: Never Used  . Alcohol use No  . Drug use: No  . Sexual activity: Yes    Birth control/ protection: Post-menopausal

## 2017-01-21 DIAGNOSIS — Z79899 Other long term (current) drug therapy: Secondary | ICD-10-CM | POA: Diagnosis not present

## 2017-01-21 DIAGNOSIS — F649 Gender identity disorder, unspecified: Secondary | ICD-10-CM | POA: Diagnosis not present

## 2017-02-11 ENCOUNTER — Encounter (INDEPENDENT_AMBULATORY_CARE_PROVIDER_SITE_OTHER): Payer: Self-pay | Admitting: Orthopaedic Surgery

## 2017-02-11 ENCOUNTER — Ambulatory Visit (INDEPENDENT_AMBULATORY_CARE_PROVIDER_SITE_OTHER): Payer: BC Managed Care – PPO | Admitting: Orthopaedic Surgery

## 2017-02-11 VITALS — BP 132/84 | HR 102 | Ht 67.0 in | Wt 222.0 lb

## 2017-02-11 DIAGNOSIS — Z96651 Presence of right artificial knee joint: Secondary | ICD-10-CM

## 2017-02-11 DIAGNOSIS — Z96652 Presence of left artificial knee joint: Secondary | ICD-10-CM

## 2017-02-11 NOTE — Progress Notes (Signed)
Post-Op Visit Note   Patient: Laura Allen           Date of Birth: Jun 17, 1951           MRN: [id number] Visit Date: 02/11/2017 PCP: Colette Ribas, MD   Assessment & Plan:  Chief Complaint:  Chief Complaint  Patient presents with  . Left Knee - Routine Post Op   Visit Diagnoses:  1. Status post total right knee replacement   2. S/P total knee arthroplasty, left     Plan: Patient returns post the left total neoplastic shows full extension good flexion past 120 is very happy with the surgical result. She now has matching knees and his work working and walking well. She's had some problems with her right ankle a little bit. She is getting ready to change rheumatologists. She's done well with her therapy and I congratulated her on her hard work. I'll check her back again on a when necessary basis. We discussed wellness walking activities , cardiovascular fitness. She should be able to do more things with her new knees. Return when necessary.  Follow-Up Instructions: Return if symptoms worsen or fail to improve.   Orders:  No orders of the defined types were placed in this encounter.  No orders of the defined types were placed in this encounter.   Imaging: No results found.  PMFS History: Patient Active Problem List   Diagnosis Date Noted  . Left knee DJD 11/25/2016  . Nontoxic multinodular goiter 03/19/2016  . Primary hypothyroidism 09/11/2015  . Status post total right knee replacement 05/20/2015  . Rheumatic joint disease 12/03/2011  . Primary osteoarthritis of left knee 12/03/2011   Past Medical History:  Diagnosis Date  . Anemia    PMH  . Bronchitis    PMH  . Family history of adverse reaction to anesthesia    mother gets PONV  . H/O exercise stress test    [facility]  . H/O seasonal allergies   . Hearing aid worn    B/L  . History of underactive thyroid   . Hypertension   . Hypothyroidism   . Osteoarthritis (arthritis due to wear  and tear of joints)   . Pancreatitis   . Rheumatoid arthritis(714.0)   . Sleep apnea    mild; does not wear CPAP  . Wears glasses     Family History  Problem Relation Age of Onset  . Arthritis    . Cancer    . Diabetes      Past Surgical History:  Procedure Laterality Date  . CATARACT EXTRACTION W/PHACO Left 09/04/2013   Procedure: CATARACT EXTRACTION PHACO AND INTRAOCULAR LENS PLACEMENT (IOC);  Surgeon: Gemma Payor, MD;  Location: [facility];  Service: Ophthalmology;  Laterality: Left;  CDE:  10.22  . CATARACT EXTRACTION W/PHACO Right 09/14/2013   Procedure: CATARACT EXTRACTION PHACO AND INTRAOCULAR LENS PLACEMENT (IOC);  Surgeon: Gemma Payor, MD;  Location: [facility];  Service: Ophthalmology;  Laterality: Right;  CDE:8.81  . CHOLECYSTECTOMY    . COLONOSCOPY    . KNEE ARTHROPLASTY Right 05/20/2015   Procedure: COMPUTER ASSISTED TOTAL KNEE ARTHROPLASTY;  Surgeon: Eldred Manges, MD;  Location: [facility];  Service: Orthopedics;  Laterality: Right;  . OVARIAN CYST SURGERY    . TOTAL KNEE ARTHROPLASTY Left 11/25/2016   Procedure: LEFT TOTAL KNEE ARTHROPLASTY;  Surgeon: Eldred Manges, MD;  Location: [facility];  Service: Orthopedics;  Laterality: Left;   Social History   Occupational History  . Not on  file.   Social History Main Topics  . Smoking status: Never Smoker  . Smokeless tobacco: Never Used  . Alcohol use No  . Drug use: No  . Sexual activity: Yes    Birth control/ protection: Post-menopausal

## 2017-03-15 ENCOUNTER — Other Ambulatory Visit: Payer: Self-pay | Admitting: "Endocrinology

## 2017-03-15 DIAGNOSIS — E039 Hypothyroidism, unspecified: Secondary | ICD-10-CM | POA: Diagnosis not present

## 2017-03-15 LAB — T4, FREE: Free T4: 1.5 ng/dL (ref 0.8–1.8)

## 2017-03-15 LAB — TSH: TSH: 1.25 m[IU]/L

## 2017-03-22 ENCOUNTER — Ambulatory Visit (INDEPENDENT_AMBULATORY_CARE_PROVIDER_SITE_OTHER): Payer: Medicare Other | Admitting: "Endocrinology

## 2017-03-22 ENCOUNTER — Encounter: Payer: Self-pay | Admitting: "Endocrinology

## 2017-03-22 VITALS — BP 131/85 | HR 103 | Ht 67.0 in | Wt 230.0 lb

## 2017-03-22 DIAGNOSIS — E039 Hypothyroidism, unspecified: Secondary | ICD-10-CM

## 2017-03-22 NOTE — Progress Notes (Signed)
HPI  Laura Allen is a 66 y.o.-year-old female, here to follow up for her hypothyroidism.  She is on levothyroxine 100 g, by mouth every morning. She is compliant. No new complaints.  She has steady weight gain of  10 lbs since august 2017.  She reports to have been diagnosed with hypothyroidism at age 38 yrs  and has been on thyroid hormone ever since. I reviewed pt's thyroid tests: Consistent with appropriate replacement. She denies cold intolerance, heat intolerance. No h/o radiation tx to head or neck. She has had multiple bilateral subcentimeter nodules and ultrasound study of the thyroid in 2011.   ROS: Constitutional: +weight  Gain. - no subjective hyperthermia/hypothermia Eyes: no blurry vision, no xerophthalmia ENT: no sore throat, no nodules palpated in throat, no dysphagia/odynophagia, no hoarseness Cardiovascular: no CP/SOB/palpitations/leg swelling Respiratory: no cough/SOB Gastrointestinal: no N/V/D/C Musculoskeletal: no muscle/joint aches Skin: no rashes Neurological: no tremors/numbness/tingling/dizziness Psychiatric: no depression/anxiety  PE: BP 131/85   Pulse (!) 103   Ht 5\' 7"  (1.702 m)   Wt 230 lb (104.3 kg)   BMI 36.02 kg/m  Wt Readings from Last 3 Encounters:  03/22/17 230 lb (104.3 kg)  02/11/17 222 lb (100.7 kg)  01/07/17 225 lb (102.1 kg)   Constitutional: overweight, in NAD Eyes: PERRLA, EOMI, no exophthalmos ENT: moist mucous membranes, no gross change in thyroid exam, no cervical lymphadenopathy Cardiovascular: RRR, No MRG Respiratory: CTA B Gastrointestinal: abdomen soft, NT, ND, BS+ Musculoskeletal: no deformities, strength intact in all 4 Skin: moist, warm, no rashes Neurological: no tremor with outstretched hands, DTR normal in all 4   Recent Results (from the past 2160 hour(s))  TSH     Status: None   Collection Time: 03/15/17  8:07 AM  Result Value Ref Range   TSH 1.25 mIU/L    Comment:   Reference Range   > or = 20  Years  0.40-4.50   Pregnancy Range First trimester  0.26-2.66 Second trimester 0.55-2.73 Third trimester  0.43-2.91     T4, free     Status: None   Collection Time: 03/15/17  8:07 AM  Result Value Ref Range   Free T4 1.5 0.8 - 1.8 ng/dL    ASSESSMENT: 1. Hypothyroidism 2. Multinodular goiter  PLAN:    Patient with long-standing hypothyroidism, on levothyroxine therapy.  -Her thyroid function tests are consistent with appropriate replacement. I will continue  levothyroxine  100 g by mouth every morning.  - We discussed about correct intake of levothyroxine, at fasting, with water, separated by at least 30 minutes from breakfast, and separated by more than 4 hours from calcium, iron, multivitamins, acid reflux medications (PPIs). -Patient is made aware of the fact that thyroid hormone replacement is needed for life, dose to be adjusted by periodic monitoring of thyroid function tests. - Will check thyroid tests before next visit in 6 months: TSH, free T4. -  Her follow-up ultrasound of the thyroid shows normal size thyroid with 1.1 cm left lobe nodule previously not observed. She will not need any intervention at this time ,however will need a follow-up thyroid/neck ultrasound in 1 year.   05/15/17, MD Phone: [phone]  Fax: [phone]   03/22/2017, 9:17 AM

## 2017-04-12 NOTE — Anesthesia Postprocedure Evaluation (Signed)
Anesthesia Post Note  Patient: Laura Allen  Procedure(s) Performed: Procedure(s) (LRB): LEFT TOTAL KNEE ARTHROPLASTY (Left)     Anesthesia Post Evaluation  Last Vitals:  Vitals:   11/26/16 2209 11/27/16 1129  BP: (!) 143/84 (!) 111/48  Pulse: (!) 111 93  Resp: 16 18  Temp: 37.5 C     Last Pain:  Vitals:   11/27/16 1615  TempSrc:   PainSc: 0-No pain                 Adante Courington S

## 2017-04-12 NOTE — Addendum Note (Signed)
Addendum  created 04/12/17 1008 by Eilene Ghazi, MD   Sign clinical note

## 2017-04-26 ENCOUNTER — Other Ambulatory Visit: Payer: Self-pay | Admitting: "Endocrinology

## 2017-04-29 DIAGNOSIS — Z6836 Body mass index (BMI) 36.0-36.9, adult: Secondary | ICD-10-CM | POA: Diagnosis not present

## 2017-04-29 DIAGNOSIS — Z79899 Other long term (current) drug therapy: Secondary | ICD-10-CM | POA: Diagnosis not present

## 2017-04-29 DIAGNOSIS — M150 Primary generalized (osteo)arthritis: Secondary | ICD-10-CM | POA: Diagnosis not present

## 2017-04-29 DIAGNOSIS — M79642 Pain in left hand: Secondary | ICD-10-CM | POA: Diagnosis not present

## 2017-04-29 DIAGNOSIS — R5383 Other fatigue: Secondary | ICD-10-CM | POA: Diagnosis not present

## 2017-04-29 DIAGNOSIS — M2550 Pain in unspecified joint: Secondary | ICD-10-CM | POA: Diagnosis not present

## 2017-04-29 DIAGNOSIS — M79641 Pain in right hand: Secondary | ICD-10-CM | POA: Diagnosis not present

## 2017-04-29 DIAGNOSIS — E669 Obesity, unspecified: Secondary | ICD-10-CM | POA: Diagnosis not present

## 2017-04-29 DIAGNOSIS — M0579 Rheumatoid arthritis with rheumatoid factor of multiple sites without organ or systems involvement: Secondary | ICD-10-CM | POA: Diagnosis not present

## 2017-08-04 DIAGNOSIS — Z6836 Body mass index (BMI) 36.0-36.9, adult: Secondary | ICD-10-CM | POA: Diagnosis not present

## 2017-08-04 DIAGNOSIS — Z79899 Other long term (current) drug therapy: Secondary | ICD-10-CM | POA: Diagnosis not present

## 2017-08-04 DIAGNOSIS — M0579 Rheumatoid arthritis with rheumatoid factor of multiple sites without organ or systems involvement: Secondary | ICD-10-CM | POA: Diagnosis not present

## 2017-08-04 DIAGNOSIS — M2550 Pain in unspecified joint: Secondary | ICD-10-CM | POA: Diagnosis not present

## 2017-08-04 DIAGNOSIS — M150 Primary generalized (osteo)arthritis: Secondary | ICD-10-CM | POA: Diagnosis not present

## 2017-08-04 DIAGNOSIS — E669 Obesity, unspecified: Secondary | ICD-10-CM | POA: Diagnosis not present

## 2017-08-13 DIAGNOSIS — I10 Essential (primary) hypertension: Secondary | ICD-10-CM | POA: Diagnosis not present

## 2017-08-13 DIAGNOSIS — Z6837 Body mass index (BMI) 37.0-37.9, adult: Secondary | ICD-10-CM | POA: Diagnosis not present

## 2017-08-13 DIAGNOSIS — J302 Other seasonal allergic rhinitis: Secondary | ICD-10-CM | POA: Diagnosis not present

## 2017-08-13 DIAGNOSIS — Z23 Encounter for immunization: Secondary | ICD-10-CM | POA: Diagnosis not present

## 2017-08-13 DIAGNOSIS — Z1389 Encounter for screening for other disorder: Secondary | ICD-10-CM | POA: Diagnosis not present

## 2017-09-15 DIAGNOSIS — E039 Hypothyroidism, unspecified: Secondary | ICD-10-CM | POA: Diagnosis not present

## 2017-09-15 LAB — T4, FREE: Free T4: 1.4 ng/dL (ref 0.8–1.8)

## 2017-09-15 LAB — TSH: TSH: 1.97 mIU/L (ref 0.40–4.50)

## 2017-09-22 ENCOUNTER — Ambulatory Visit (INDEPENDENT_AMBULATORY_CARE_PROVIDER_SITE_OTHER): Payer: Medicare Other | Admitting: "Endocrinology

## 2017-09-22 ENCOUNTER — Encounter: Payer: Self-pay | Admitting: "Endocrinology

## 2017-09-22 VITALS — BP 118/83 | HR 98 | Ht 67.0 in | Wt 230.0 lb

## 2017-09-22 DIAGNOSIS — E039 Hypothyroidism, unspecified: Secondary | ICD-10-CM

## 2017-09-22 DIAGNOSIS — E042 Nontoxic multinodular goiter: Secondary | ICD-10-CM

## 2017-09-22 NOTE — Progress Notes (Signed)
HPI  Laura Allen is a 66 y.o.-year-old female, here to follow up for her hypothyroidism.  She is on levothyroxine 100 g, by mouth every morning. She is compliant. No new complaints.  She is compliant to her medications. Her weight stayed steady since last visit.  She reports to have been diagnosed with hypothyroidism at age 38 yrs  and has been on various doses of thyroid hormone ever since, until it was stabilized at 100 g by mouth daily. I reviewed pt's thyroid tests: Consistent with appropriate replacement. She denies cold intolerance, heat intolerance. No h/o radiation tx to head or neck. She has had multiple bilateral subcentimeter nodules and ultrasound study of the thyroid in 2011, and in 2017.   ROS: Constitutional: + Steady weight. - no subjective hyperthermia/hypothermia Eyes: no blurry vision, no xerophthalmia ENT: no sore throat, no nodules palpated in throat, no dysphagia/odynophagia, no hoarseness Cardiovascular: no CP/SOB/palpitations/leg swelling Respiratory: no cough/SOB Gastrointestinal: no N/V/D/C Musculoskeletal: no muscle/joint aches Skin: no rashes Neurological: no tremors/numbness/tingling/dizziness Psychiatric: no depression/anxiety  PE: BP 118/83   Pulse 98   Ht 5\' 7"  (1.702 m)   Wt 230 lb (104.3 kg)   BMI 36.02 kg/m  Wt Readings from Last 3 Encounters:  09/22/17 230 lb (104.3 kg)  03/22/17 230 lb (104.3 kg)  02/11/17 222 lb (100.7 kg)   Constitutional: overweight, in NAD Eyes: PERRLA, EOMI, no exophthalmos ENT: moist mucous membranes, no gross change in thyroid exam, no cervical lymphadenopathy Cardiovascular: RRR, No MRG Respiratory: CTA B Gastrointestinal: abdomen soft, NT, ND, BS+ Musculoskeletal: no deformities, strength intact in all 4 Skin: moist, warm, no rashes Neurological: no tremor with outstretched hands, DTR normal in all 4   Recent Results (from the past 2160 hour(s))  TSH     Status: None   Collection Time: 09/15/17   8:53 AM  Result Value Ref Range   TSH 1.97 0.40 - 4.50 mIU/L  T4, free     Status: None   Collection Time: 09/15/17  8:53 AM  Result Value Ref Range   Free T4 1.4 0.8 - 1.8 ng/dL    ASSESSMENT: 1. Hypothyroidism 2. Multinodular goiter  PLAN:    Patient with long-standing hypothyroidism, on levothyroxine therapy.  -Her thyroid function tests are consistent with appropriate replacement. I will continue  levothyroxine  100 g by mouth every morning.   - We discussed about correct intake of levothyroxine, at fasting, with water, separated by at least 30 minutes from breakfast, and separated by more than 4 hours from calcium, iron, multivitamins, acid reflux medications (PPIs). -Patient is made aware of the fact that thyroid hormone replacement is needed for life, dose to be adjusted by periodic monitoring of thyroid function tests. -  Her follow-up ultrasound of the thyroid shows normal size thyroid with 1.1 cm left lobe nodule previously not observed. She will not need any intervention at this time ,however will need a follow-up thyroid/neck ultrasound in 1 year.   13/07/18, MD Phone: [phone]  Fax: [phone]  -  This note was partially dictated with voice recognition software. Similar sounding words can be transcribed inadequately or may not  be corrected upon review. 09/22/2017, 9:30 AM

## 2017-09-24 ENCOUNTER — Other Ambulatory Visit (HOSPITAL_COMMUNITY): Payer: Self-pay | Admitting: Family Medicine

## 2017-09-24 DIAGNOSIS — Z1231 Encounter for screening mammogram for malignant neoplasm of breast: Secondary | ICD-10-CM

## 2017-10-06 ENCOUNTER — Ambulatory Visit (HOSPITAL_COMMUNITY)
Admission: RE | Admit: 2017-10-06 | Discharge: 2017-10-06 | Disposition: A | Discharge status: Home / Self Care | Payer: Medicare Other | Source: Ambulatory Visit | Attending: Family Medicine | Admitting: Family Medicine

## 2017-10-06 DIAGNOSIS — Z1231 Encounter for screening mammogram for malignant neoplasm of breast: Secondary | ICD-10-CM | POA: Insufficient documentation

## 2017-11-10 DIAGNOSIS — M0579 Rheumatoid arthritis with rheumatoid factor of multiple sites without organ or systems involvement: Secondary | ICD-10-CM | POA: Diagnosis not present

## 2017-11-10 DIAGNOSIS — Z79899 Other long term (current) drug therapy: Secondary | ICD-10-CM | POA: Diagnosis not present

## 2017-11-10 DIAGNOSIS — M2550 Pain in unspecified joint: Secondary | ICD-10-CM | POA: Diagnosis not present

## 2017-11-10 DIAGNOSIS — M150 Primary generalized (osteo)arthritis: Secondary | ICD-10-CM | POA: Diagnosis not present

## 2018-01-15 ENCOUNTER — Other Ambulatory Visit: Payer: Self-pay | Admitting: "Endocrinology

## 2018-01-18 MED ORDER — LEVOTHYROXINE SODIUM 100 MCG PO TABS: 100.0000 ug | ORAL_TABLET | Freq: Every day | ORAL | 6 refills | Status: DC

## 2018-01-21 ENCOUNTER — Other Ambulatory Visit (HOSPITAL_COMMUNITY): Payer: Self-pay | Admitting: Family Medicine

## 2018-01-21 ENCOUNTER — Ambulatory Visit (HOSPITAL_COMMUNITY)
Admission: RE | Admit: 2018-01-21 | Discharge: 2018-01-21 | Disposition: A | Discharge status: Home / Self Care | Payer: Medicare Other | Source: Ambulatory Visit | Attending: Family Medicine | Admitting: Family Medicine

## 2018-01-21 DIAGNOSIS — J069 Acute upper respiratory infection, unspecified: Secondary | ICD-10-CM

## 2018-01-21 DIAGNOSIS — Z1389 Encounter for screening for other disorder: Secondary | ICD-10-CM | POA: Diagnosis not present

## 2018-01-21 DIAGNOSIS — Z6838 Body mass index (BMI) 38.0-38.9, adult: Secondary | ICD-10-CM | POA: Diagnosis not present

## 2018-02-08 DIAGNOSIS — M0579 Rheumatoid arthritis with rheumatoid factor of multiple sites without organ or systems involvement: Secondary | ICD-10-CM | POA: Diagnosis not present

## 2018-02-08 DIAGNOSIS — M2550 Pain in unspecified joint: Secondary | ICD-10-CM | POA: Diagnosis not present

## 2018-02-08 DIAGNOSIS — Z6836 Body mass index (BMI) 36.0-36.9, adult: Secondary | ICD-10-CM | POA: Diagnosis not present

## 2018-02-08 DIAGNOSIS — E669 Obesity, unspecified: Secondary | ICD-10-CM | POA: Diagnosis not present

## 2018-03-01 DIAGNOSIS — Z6837 Body mass index (BMI) 37.0-37.9, adult: Secondary | ICD-10-CM | POA: Diagnosis not present

## 2018-03-01 DIAGNOSIS — J22 Unspecified acute lower respiratory infection: Secondary | ICD-10-CM | POA: Diagnosis not present

## 2018-03-01 DIAGNOSIS — J209 Acute bronchitis, unspecified: Secondary | ICD-10-CM | POA: Diagnosis not present

## 2018-04-13 DIAGNOSIS — Z6837 Body mass index (BMI) 37.0-37.9, adult: Secondary | ICD-10-CM | POA: Diagnosis not present

## 2018-04-13 DIAGNOSIS — J069 Acute upper respiratory infection, unspecified: Secondary | ICD-10-CM | POA: Diagnosis not present

## 2018-04-13 DIAGNOSIS — J45909 Unspecified asthma, uncomplicated: Secondary | ICD-10-CM | POA: Diagnosis not present

## 2018-05-10 DIAGNOSIS — E669 Obesity, unspecified: Secondary | ICD-10-CM | POA: Diagnosis not present

## 2018-05-10 DIAGNOSIS — Z6836 Body mass index (BMI) 36.0-36.9, adult: Secondary | ICD-10-CM | POA: Diagnosis not present

## 2018-05-10 DIAGNOSIS — M150 Primary generalized (osteo)arthritis: Secondary | ICD-10-CM | POA: Diagnosis not present

## 2018-05-10 DIAGNOSIS — M0579 Rheumatoid arthritis with rheumatoid factor of multiple sites without organ or systems involvement: Secondary | ICD-10-CM | POA: Diagnosis not present

## 2018-05-25 DIAGNOSIS — Z1389 Encounter for screening for other disorder: Secondary | ICD-10-CM | POA: Diagnosis not present

## 2018-05-25 DIAGNOSIS — Z0001 Encounter for general adult medical examination with abnormal findings: Secondary | ICD-10-CM | POA: Diagnosis not present

## 2018-05-25 DIAGNOSIS — Z6837 Body mass index (BMI) 37.0-37.9, adult: Secondary | ICD-10-CM | POA: Diagnosis not present

## 2018-05-25 DIAGNOSIS — M1991 Primary osteoarthritis, unspecified site: Secondary | ICD-10-CM | POA: Diagnosis not present

## 2018-05-30 ENCOUNTER — Ambulatory Visit (INDEPENDENT_AMBULATORY_CARE_PROVIDER_SITE_OTHER): Payer: Self-pay

## 2018-05-30 ENCOUNTER — Other Ambulatory Visit (HOSPITAL_COMMUNITY): Payer: Self-pay | Admitting: Family Medicine

## 2018-05-30 DIAGNOSIS — E2839 Other primary ovarian failure: Secondary | ICD-10-CM

## 2018-05-30 DIAGNOSIS — Z1211 Encounter for screening for malignant neoplasm of colon: Secondary | ICD-10-CM

## 2018-05-30 MED ORDER — NA SULFATE-K SULFATE-MG SULF 17.5-3.13-1.6 GM/177ML PO SOLN: 1.0000 | ORAL | 0 refills | Status: DC

## 2018-05-30 NOTE — Progress Notes (Signed)
Gastroenterology Pre-Procedure Review  Request Date:05/30/18 Requesting Physician: Dr.Golding ( pt reports last tcs 10+ years ago with [facility]- she said it was before he moved into this building. I cannot find any record of it in Epic)  PATIENT REVIEW QUESTIONS: The patient responded to the following health history questions as indicated:    1. Diabetes Melitis: no 2. Joint replacements in the past 12 months: no 3. Major health problems in the past 3 months: no 4. Has an artificial valve or MVP: no 5. Has a defibrillator: no 6. Has been advised in past to take antibiotics in advance of a procedure like teeth cleaning: yes (joint replacements 2 years ago) 7. Family history of colon cancer: no  8. Alcohol Use: no 9. History of sleep apnea: no  10. History of coronary artery or other vascular stents placed within the last 12 months: no 11. History of any prior anesthesia complications: no   Pt is on methotrexate and humira and wants to know if she needs to stop these prior to the procedure?    MEDICATIONS & ALLERGIES:    Patient reports the following regarding taking any blood thinners:   Plavix? no Aspirin? no Coumadin? no Brilinta? no Xarelto? no Eliquis? no Pradaxa? no Savaysa? no Effient? no  Patient confirms/reports the following medications:  Current Outpatient Medications  Medication Sig Dispense Refill  . adalimumab (HUMIRA) 40 MG/0.8ML injection Inject 40 mg into the skin every 14 (fourteen) days. Has stopped prior to procedure    . calcium carbonate (OS-CAL) 600 MG TABS tablet Take 600 mg by mouth daily.    . cholecalciferol (VITAMIN D) 1000 UNITS tablet Take 1,000 Units by mouth daily.    Marland Kitchen diltiazem (CARDIZEM SR) 120 MG 12 hr capsule Take 120 mg by mouth daily.  0  . folic acid (FOLVITE) 1 MG tablet Take 1 mg by mouth daily.    . Levocetirizine Dihydrochloride (XYZAL ALLERGY 24HR PO) Take by mouth as needed.    Marland Kitchen levothyroxine (SYNTHROID, LEVOTHROID) 100 MCG tablet Take  1 tablet (100 mcg total) by mouth daily before breakfast. 30 tablet 6  . methotrexate (RHEUMATREX) 2.5 MG tablet Take 10 mg by mouth 2 (two) times a week. Caution:Chemotherapy. Protect from light. On Thursdays and Fridays Has stopped prior to procedure    . Potassium Gluconate 550 MG TABS Take 550 mg by mouth daily.     No current facility-administered medications for this visit.     Patient confirms/reports the following allergies:  Allergies  Allergen Reactions  . Ibuprofen Swelling    Can take in small doses.  Large doses cause swelling.   . Levofloxacin Hives and Rash  . Streptomycin Hives and Rash    No orders of the defined types were placed in this encounter.   AUTHORIZATION INFORMATION Primary Insurance: medicare,  ID #: [id number] Pre-Cert / Auth required: no   SCHEDULE INFORMATION: Procedure has been scheduled as follows:  Date: 06/24/18, Time: 9:45 Location: [facility] Dr.Rourk  This Gastroenterology Pre-Precedure Review Form is being routed to the following provider(s): Tana Coast, PA

## 2018-05-30 NOTE — Progress Notes (Signed)
Ok to schedule. No need to hold Humira or methotrexate.

## 2018-05-30 NOTE — Patient Instructions (Signed)
Laura Allen  29-Oct-1951 MRN: [id number]     Procedure Date: 06/24/18 Time to register: 8:45am Place to register: [facility] Procedure Time: 9:45am Scheduled provider: R. Garfield Cornea, MD    PREPARATION FOR COLONOSCOPY WITH SUPREP BOWEL PREP KIT  Note: Suprep Bowel Prep Kit is a split-dose (2day) regimen. Consumption of BOTH 6-ounce bottles is required for a complete prep.  Please notify us immediately if you are diabetic, take iron supplements, or if you are on Coumadin or any other blood thinners.                                                                                                                                                    2 DAYS BEFORE PROCEDURE:  DATE: 06/22/18   DAY: Wednesday Begin clear liquid diet AFTER your lunch meal. NO SOLID FOODS after this point.  1 DAY BEFORE PROCEDURE:  DATE: 06/23/18   DAY: Thursday Continue clear liquids the entire day - NO SOLID FOOD.     At 6:00pm: Complete steps 1 through 4 below, using ONE (1) 6-ounce bottle, before going to bed. Step 1:  Pour ONE (1) 6-ounce bottle of SUPREP liquid into the mixing container.  Step 2:  Add cool drinking water to the 16 ounce line on the container and mix.  Note: Dilute the solution concentrate as directed prior to use. Step 3:  DRINK ALL the liquid in the container. Step 4:  You MUST drink an additional two (2) or more 16 ounce containers of water over the next one (1) hour.   Continue clear liquids.  DAY OF PROCEDURE:   DATE: 06/24/18  DAY: Friday If you take medications for your heart, blood pressure, or breathing, you may take these medications.    5 hours before your procedure at 4:45am: Step 1:  Pour ONE (1) 6-ounce bottle of SUPREP liquid into the mixing container.  Step 2:  Add cool drinking water to the 16 ounce line on the container and mix.  Note: Dilute the solution concentrate as directed prior to use. Step 3:  DRINK ALL the liquid in the container. Step  4:  You MUST drink an additional two (2) or more 16 ounce containers of water over the next one (1) hour. You MUST complete the final glass of water at least 3 hours before your colonoscopy.   Nothing by mouth past: 6:45am  You may take your morning medications with sip of water unless we have instructed otherwise.    Please see below for Dietary Information.  CLEAR LIQUIDS INCLUDE:  Water Jello (NOT red in color)   Ice Popsicles (NOT red in color)   Tea (sugar ok, no milk/cream) Powdered fruit flavored drinks  Coffee (sugar ok, no milk/cream) Gatorade/ Lemonade/ Kool-Aid  (NOT red in color)   Juice: apple, white grape, white  cranberry Soft drinks  Clear bullion, consomme, broth (fat free beef/chicken/vegetable)  Carbonated beverages (any kind)  Strained chicken noodle soup Hard Candy   Remember: Clear liquids are liquids that will allow you to see your fingers on the other side of a clear glass. Be sure liquids are NOT red in color, and not cloudy, but CLEAR.  DO NOT EAT OR DRINK ANY OF THE FOLLOWING:  Dairy products of any kind   Cranberry juice Tomato juice / V8 juice   Grapefruit juice Orange juice     Red grape juice  Do not eat any solid foods, including such foods as: cereal, oatmeal, yogurt, fruits, vegetables, creamed soups, eggs, bread, crackers, pureed foods in a blender, etc.   HELPFUL HINTS FOR DRINKING PREP SOLUTION:   Make sure prep is extremely cold. Mix and refrigerate the the morning of the prep. You may also put in the freezer.   You may try mixing some Crystal Light or Country Time Lemonade if you prefer. Mix in small amounts; add more if necessary.  Try drinking through a straw  Rinse mouth with water or a mouthwash between glasses, to remove after-taste.  Try sipping on a cold beverage /ice/ popsicles between glasses of prep.  Place a piece of sugar-free hard candy in mouth between glasses.  If you become nauseated, try consuming smaller amounts, or  stretch out the time between glasses. Stop for 30-60 minutes, then slowly start back drinking.     OTHER INSTRUCTIONS  You will need a responsible adult at least 67 years of age to accompany you and drive you home. This person must remain in the waiting room during your procedure. The hospital will cancel your procedure if you do not have a responsible adult with you.   1. Wear loose fitting clothing that is easily removed. 2. Leave jewelry and other valuables at home.  3. Remove all body piercing jewelry and leave at home. 4. Total time from sign-in until discharge is approximately 2-3 hours. 5. You should go home directly after your procedure and rest. You can resume normal activities the day after your procedure. 6. The day of your procedure you should not:  Drive  Make legal decisions  Operate machinery  Drink alcohol  Return to work   You may call the office (Dept: [phone]) before 5:00pm, or page the doctor on call [phone]) after 5:00pm, for further instructions, if necessary.   Insurance Information YOU WILL NEED TO CHECK WITH YOUR INSURANCE COMPANY FOR THE BENEFITS OF COVERAGE YOU HAVE FOR THIS PROCEDURE.  UNFORTUNATELY, NOT ALL INSURANCE COMPANIES HAVE BENEFITS TO COVER ALL OR PART OF THESE TYPES OF PROCEDURES.  IT IS YOUR RESPONSIBILITY TO CHECK YOUR BENEFITS, HOWEVER, WE WILL BE GLAD TO ASSIST YOU WITH ANY CODES YOUR INSURANCE COMPANY MAY NEED.    PLEASE NOTE THAT MOST INSURANCE COMPANIES WILL NOT COVER A SCREENING COLONOSCOPY FOR PEOPLE UNDER THE AGE OF 50  IF YOU HAVE BC[employer] INSURANCE, YOU MAY HAVE BENEFITS FOR A SCREENING COLONOSCOPY BUT IF POLYPS ARE FOUND THE DIAGNOSIS WILL CHANGE AND THEN YOU MAY HAVE A DEDUCTIBLE THAT WILL NEED TO BE MET. SO PLEASE MAKE SURE YOU CHECK YOUR BENEFITS FOR A SCREENING COLONOSCOPY AS WELL AS A DIAGNOSTIC COLONOSCOPY.

## 2018-05-31 ENCOUNTER — Ambulatory Visit (HOSPITAL_COMMUNITY)
Admission: RE | Admit: 2018-05-31 | Discharge: 2018-05-31 | Disposition: A | Discharge status: Home / Self Care | Payer: Medicare Other | Source: Ambulatory Visit | Attending: Family Medicine | Admitting: Family Medicine

## 2018-05-31 DIAGNOSIS — E2839 Other primary ovarian failure: Secondary | ICD-10-CM | POA: Diagnosis present

## 2018-05-31 DIAGNOSIS — Z780 Asymptomatic menopausal state: Secondary | ICD-10-CM | POA: Diagnosis not present

## 2018-05-31 DIAGNOSIS — M810 Age-related osteoporosis without current pathological fracture: Secondary | ICD-10-CM | POA: Diagnosis not present

## 2018-05-31 NOTE — Progress Notes (Signed)
Called pt- LMOM per her request, that she did not need to hold medications.

## 2018-06-21 IMAGING — DX DG CHEST 2V
2 series · 2 of 2 positions shown · non-contrast
Comparison: 05/10/2015

CLINICAL DATA: Acute upper respiratory tract infection.

EXAM:
CHEST - 2 VIEW

[chest pa]
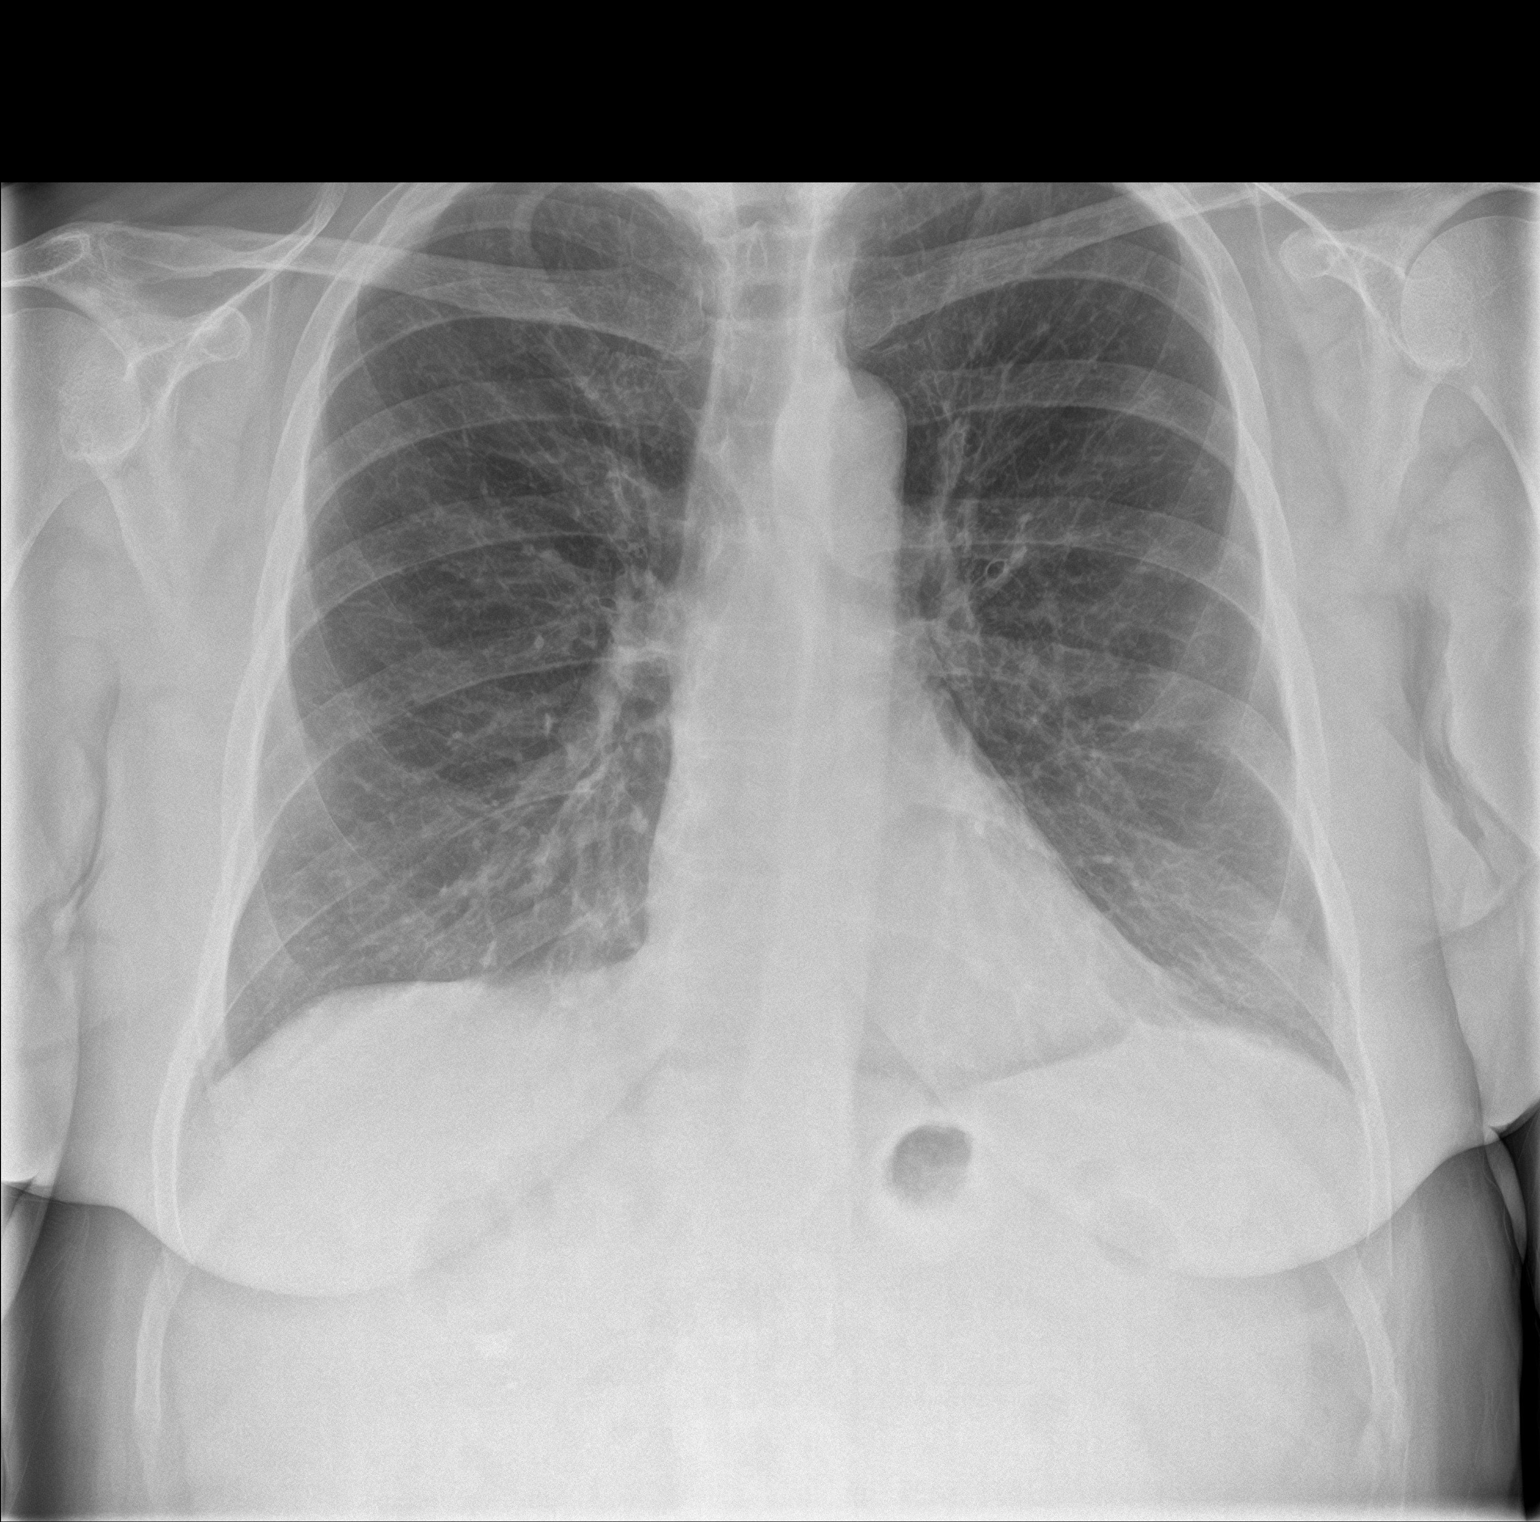

[chest lat]
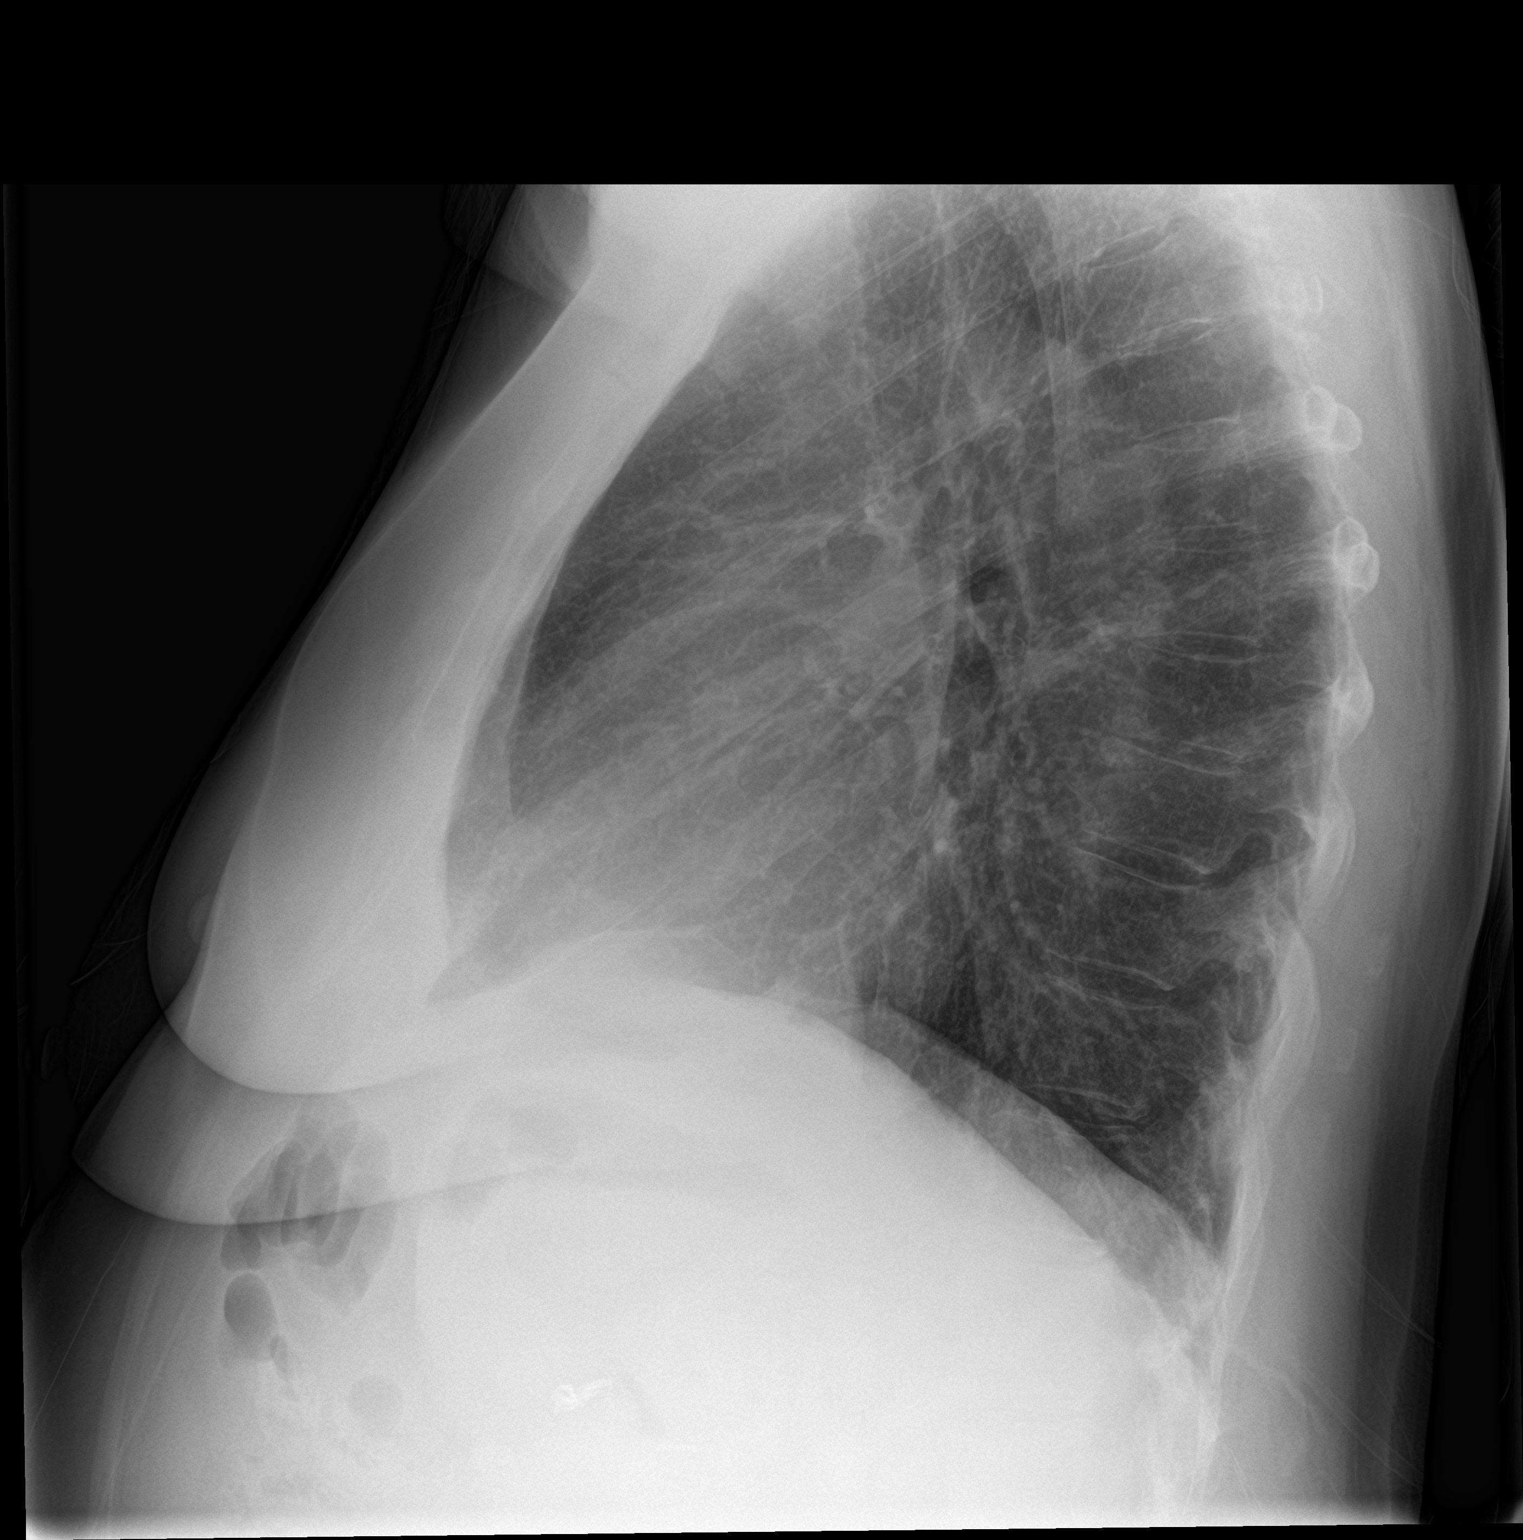

[2 of 2 positions shown; findings below may reference images not displayed]

FINDINGS: Normal heart size and mediastinal contours. No acute infiltrate or
edema. No effusion or pneumothorax. No acute osseous findings.
Cholecystectomy clips
IMPRESSION: No evidence of active disease.  Stable from 2340.

## 2018-06-24 ENCOUNTER — Encounter (HOSPITAL_COMMUNITY)
Admission: RE | Disposition: A | Discharge status: Home / Self Care | Payer: Self-pay | Source: Ambulatory Visit | Attending: Internal Medicine

## 2018-06-24 ENCOUNTER — Ambulatory Visit (HOSPITAL_COMMUNITY)
Admission: RE | Admit: 2018-06-24 | Discharge: 2018-06-24 | Disposition: A | Discharge status: Home / Self Care | Payer: Medicare Other | Source: Ambulatory Visit | Attending: Internal Medicine | Admitting: Internal Medicine

## 2018-06-24 ENCOUNTER — Other Ambulatory Visit: Payer: Self-pay

## 2018-06-24 DIAGNOSIS — Z79890 Hormone replacement therapy (postmenopausal): Secondary | ICD-10-CM | POA: Insufficient documentation

## 2018-06-24 DIAGNOSIS — M069 Rheumatoid arthritis, unspecified: Secondary | ICD-10-CM | POA: Diagnosis not present

## 2018-06-24 DIAGNOSIS — G4730 Sleep apnea, unspecified: Secondary | ICD-10-CM | POA: Insufficient documentation

## 2018-06-24 DIAGNOSIS — Z79899 Other long term (current) drug therapy: Secondary | ICD-10-CM | POA: Insufficient documentation

## 2018-06-24 DIAGNOSIS — Z9049 Acquired absence of other specified parts of digestive tract: Secondary | ICD-10-CM | POA: Insufficient documentation

## 2018-06-24 DIAGNOSIS — Z1212 Encounter for screening for malignant neoplasm of rectum: Secondary | ICD-10-CM | POA: Diagnosis not present

## 2018-06-24 DIAGNOSIS — E039 Hypothyroidism, unspecified: Secondary | ICD-10-CM | POA: Insufficient documentation

## 2018-06-24 DIAGNOSIS — Z96651 Presence of right artificial knee joint: Secondary | ICD-10-CM | POA: Insufficient documentation

## 2018-06-24 DIAGNOSIS — Z1211 Encounter for screening for malignant neoplasm of colon: Secondary | ICD-10-CM | POA: Diagnosis not present

## 2018-06-24 DIAGNOSIS — K5730 Diverticulosis of large intestine without perforation or abscess without bleeding: Secondary | ICD-10-CM | POA: Diagnosis not present

## 2018-06-24 DIAGNOSIS — Q438 Other specified congenital malformations of intestine: Secondary | ICD-10-CM | POA: Diagnosis not present

## 2018-06-24 DIAGNOSIS — I10 Essential (primary) hypertension: Secondary | ICD-10-CM | POA: Insufficient documentation

## 2018-06-24 HISTORY — PX: COLONOSCOPY: SHX5424

## 2018-06-24 SURGERY — COLONOSCOPY
Anesthesia: Moderate Sedation

## 2018-06-24 MED ORDER — MIDAZOLAM HCL 5 MG/5ML IJ SOLN
INTRAMUSCULAR | Status: AC
  Filled 2018-06-24: qty 5

## 2018-06-24 MED ORDER — ONDANSETRON HCL 4 MG/2ML IJ SOLN
INTRAMUSCULAR | Status: AC
  Filled 2018-06-24: qty 2

## 2018-06-24 MED ORDER — SODIUM CHLORIDE 0.9 % IV SOLN
INTRAVENOUS | Status: DC
  Administered 2018-06-24: 09:00:00 via INTRAVENOUS

## 2018-06-24 MED ORDER — MIDAZOLAM HCL 5 MG/5ML IJ SOLN
INTRAMUSCULAR | Status: DC | PRN
  Administered 2018-06-24: 2 mg via INTRAVENOUS
  Administered 2018-06-24 (×4): 1 mg via INTRAVENOUS
  Administered 2018-06-24: 2 mg via INTRAVENOUS

## 2018-06-24 MED ORDER — ONDANSETRON HCL 4 MG/2ML IJ SOLN
INTRAMUSCULAR | Status: DC | PRN
  Administered 2018-06-24: 4 mg via INTRAVENOUS

## 2018-06-24 MED ORDER — MEPERIDINE HCL 100 MG/ML IJ SOLN
INTRAMUSCULAR | Status: DC | PRN
  Administered 2018-06-24: 15 mg via INTRAVENOUS
  Administered 2018-06-24: 10 mg via INTRAVENOUS
  Administered 2018-06-24: 25 mg via INTRAVENOUS

## 2018-06-24 MED ORDER — MEPERIDINE HCL 50 MG/ML IJ SOLN
INTRAMUSCULAR | Status: AC
  Filled 2018-06-24: qty 1

## 2018-06-24 NOTE — H&P (Signed)
'@LOGO' @   Primary Care Physician:  Sharilyn Sites, MD Primary Gastroenterologist:  Dr. Gala Romney  Pre-Procedure History & Physical: HPI:  Laura Allen is a 67 y.o. female is here for a screening colonoscopy. Reported negative colonoscopy over 10 years ago.  No bowel symptoms.  No family history of colon cancer. Past Medical History:  Diagnosis Date  . Anemia    PMH  . Bronchitis    PMH  . Family history of adverse reaction to anesthesia    mother gets PONV  . H/O exercise stress test    [facility]  . H/O seasonal allergies   . Hearing aid worn    B/L  . History of underactive thyroid   . Hypertension   . Hypothyroidism   . Osteoarthritis (arthritis due to wear and tear of joints)   . Pancreatitis   . Rheumatoid arthritis(714.0)   . Sleep apnea    mild; does not wear CPAP  . Wears glasses     Past Surgical History:  Procedure Laterality Date  . CATARACT EXTRACTION W/PHACO Left 09/04/2013   Procedure: CATARACT EXTRACTION PHACO AND INTRAOCULAR LENS PLACEMENT (IOC);  Surgeon: Tonny Branch, MD;  Location: [facility];  Service: Ophthalmology;  Laterality: Left;  CDE:  10.22  . CATARACT EXTRACTION W/PHACO Right 09/14/2013   Procedure: CATARACT EXTRACTION PHACO AND INTRAOCULAR LENS PLACEMENT (IOC);  Surgeon: Tonny Branch, MD;  Location: [facility];  Service: Ophthalmology;  Laterality: Right;  CDE:8.81  . CHOLECYSTECTOMY    . COLONOSCOPY    . KNEE ARTHROPLASTY Right 05/20/2015   Procedure: COMPUTER ASSISTED TOTAL KNEE ARTHROPLASTY;  Surgeon: Marybelle Killings, MD;  Location: [facility];  Service: Orthopedics;  Laterality: Right;  . OVARIAN CYST SURGERY    . TOTAL KNEE ARTHROPLASTY Left 11/25/2016   Procedure: LEFT TOTAL KNEE ARTHROPLASTY;  Surgeon: Marybelle Killings, MD;  Location: [facility];  Service: Orthopedics;  Laterality: Left;    Prior to Admission medications   Medication Sig Start Date End Date Taking? Authorizing Provider  adalimumab (HUMIRA) 40 MG/0.8ML injection Inject 40 mg into the  skin every 14 (fourteen) days.    Yes [provider]  Calcium Carb-Cholecalciferol (CALCIUM 600/VITAMIN D3 PO) Take 1 tablet by mouth daily.   Yes [provider]  diltiazem (CARDIZEM SR) 120 MG 12 hr capsule Take 120 mg by mouth daily. 04/15/15  Yes [provider]  ergocalciferol (VITAMIN D2) 50000 units capsule Take 50,000 Units by mouth every Wednesday.   Yes [provider]  folic acid (FOLVITE) 1 MG tablet Take 1 mg by mouth daily.   Yes [provider]  levocetirizine (XYZAL ALLERGY 24HR) 5 MG tablet Take 5 mg by mouth daily.    Yes [provider]  levothyroxine (SYNTHROID, LEVOTHROID) 100 MCG tablet Take 1 tablet (100 mcg total) by mouth daily before breakfast. 01/18/18  Yes Nida, Marella Chimes, MD  methotrexate (RHEUMATREX) 2.5 MG tablet Take 10 mg by mouth 2 (two) times a week. Caution:Chemotherapy. Protect from light. On Thursdays and Fridays Has stopped prior to procedure   Yes [provider]  Na Sulfate-K Sulfate-Mg Sulf (SUPREP BOWEL PREP KIT) 17.5-3.13-1.6 GM/177ML SOLN Take 1 kit by mouth as directed. 05/30/18  Yes Mahala Menghini, PA-C  Potassium Gluconate 550 MG TABS Take 550 mg by mouth daily.   Yes [provider]    Allergies as of 05/30/2018 - Review Complete 05/30/2018  Allergen Reaction Noted  . Ibuprofen Swelling 05/10/2015  . Levofloxacin Hives and Rash 12/03/2011  .  Streptomycin Hives and Rash 12/03/2011    Family History  Problem Relation Age of Onset  . Arthritis Unknown   . Cancer Unknown   . Diabetes Unknown     Social History   Socioeconomic History  . Marital status: Married    Spouse name: Not on file  . Number of children: Not on file  . Years of education: Not on file  . Highest education level: Not on file  Occupational History  . Not on file  Social Needs  . Financial resource strain: Not on file  . Food insecurity:    Worry: Not on file    Inability: Not on file   . Transportation needs:    Medical: Not on file    Non-medical: Not on file  Tobacco Use  . Smoking status: Never Smoker  . Smokeless tobacco: Never Used  Substance and Sexual Activity  . Alcohol use: No  . Drug use: No  . Sexual activity: Yes    Birth control/protection: Post-menopausal  Lifestyle  . Physical activity:    Days per week: Not on file    Minutes per session: Not on file  . Stress: Not on file  Relationships  . Social connections:    Talks on phone: Not on file    Gets together: Not on file    Attends religious service: Not on file    Active member of club or organization: Not on file    Attends meetings of clubs or organizations: Not on file    Relationship status: Not on file  . Intimate partner violence:    Fear of current or ex partner: Not on file    Emotionally abused: Not on file    Physically abused: Not on file    Forced sexual activity: Not on file  Other Topics Concern  . Not on file  Social History Narrative  . Not on file    Review of Systems: See HPI, otherwise negative ROS  Physical Exam: BP 106/64   Pulse 96   Temp 98.4 F (36.9 C) (Oral)   Resp 16   SpO2 96%  General:   Alert,  Well-developed, well-nourished, pleasant and cooperative in NAD Head:  Normocephalic and atraumatic. Lungs:  Clear throughout to auscultation.   No wheezes, crackles, or rhonchi. No acute distress. Heart:  Regular rate and rhythm; no murmurs, clicks, rubs,  or gallops. Abdomen:  Soft, nontender and nondistended. No masses, hepatosplenomegaly or hernias noted. Normal bowel sounds, without guarding, and without rebound.    Impression/Plan: Laura Allen is now here to undergo a screening colonoscopy.    Risks, benefits, limitations, imponderables and alternatives regarding colonoscopy have been reviewed with the patient. Questions have been answered. All parties agreeable.     Notice:  This dictation was prepared with Dragon dictation along with  smaller phrase technology. Any transcriptional errors that result from this process are unintentional and may not be corrected upon review.

## 2018-06-24 NOTE — Discharge Instructions (Signed)
Colonoscopy Discharge Instructions  Read the instructions outlined below and refer to this sheet in the next few weeks. These discharge instructions provide you with general information on caring for yourself after you leave the hospital. Your doctor may also give you specific instructions. While your treatment has been planned according to the most current medical practices available, unavoidable complications occasionally occur. If you have any problems or questions after discharge, call Dr. Jena Gauss at [phone]. ACTIVITY  You may resume your regular activity, but move at a slower pace for the next 24 hours.   Take frequent rest periods for the next 24 hours.   Walking will help get rid of the air and reduce the bloated feeling in your belly (abdomen).   No driving for 24 hours (because of the medicine (anesthesia) used during the test).    Do not sign any important legal documents or operate any machinery for 24 hours (because of the anesthesia used during the test).  NUTRITION  Drink plenty of fluids.   You may resume your normal diet as instructed by your doctor.   Begin with a light meal and progress to your normal diet. Heavy or fried foods are harder to digest and may make you feel sick to your stomach (nauseated).   Avoid alcoholic beverages for 24 hours or as instructed.  MEDICATIONS  You may resume your normal medications unless your doctor tells you otherwise.  WHAT YOU CAN EXPECT TODAY  Some feelings of bloating in the abdomen.   Passage of more gas than usual.   Spotting of blood in your stool or on the toilet paper.  IF YOU HAD POLYPS REMOVED DURING THE COLONOSCOPY:  No aspirin products for 7 days or as instructed.   No alcohol for 7 days or as instructed.   Eat a soft diet for the next 24 hours.  FINDING OUT THE RESULTS OF YOUR TEST Not all test results are available during your visit. If your test results are not back during the visit, make an appointment  with your caregiver to find out the results. Do not assume everything is normal if you have not heard from your caregiver or the medical facility. It is important for you to follow up on all of your test results.  SEEK IMMEDIATE MEDICAL ATTENTION IF:  You have more than a spotting of blood in your stool.   Your belly is swollen (abdominal distention).   You are nauseated or vomiting.   You have a temperature over 101.   You have abdominal pain or discomfort that is severe or gets worse throughout the day.    Diverticulosis information provided  One more screening colonoscopy in 10 years if overall health permits.     Diverticulosis Diverticulosis is a condition that develops when small pouches (diverticula) form in the wall of the large intestine (colon). The colon is where water is absorbed and stool is formed. The pouches form when the inside layer of the colon pushes through weak spots in the outer layers of the colon. You may have a few pouches or many of them. What are the causes? The cause of this condition is not known. What increases the risk? The following factors may make you more likely to develop this condition:  Being older than age 20. Your risk for this condition increases with age. Diverticulosis is rare among people younger than age 39. By age 54, many people have it.  Eating a low-fiber diet.  Having frequent constipation.  Being  overweight.  Not getting enough exercise.  Smoking.  Taking over-the-counter pain medicines, like aspirin and ibuprofen.  Having a family history of diverticulosis.  What are the signs or symptoms? In most people, there are no symptoms of this condition. If you do have symptoms, they may include:  Bloating.  Cramps in the abdomen.  Constipation or diarrhea.  Pain in the lower left side of the abdomen.  How is this diagnosed? This condition is most often diagnosed during an exam for other colon problems. Because  diverticulosis usually has no symptoms, it often cannot be diagnosed independently. This condition may be diagnosed by:  Using a flexible scope to examine the colon (colonoscopy).  Taking an X-ray of the colon after dye has been put into the colon (barium enema).  Doing a CT scan.  How is this treated? You may not need treatment for this condition if you have never developed an infection related to diverticulosis. If you have had an infection before, treatment may include:  Eating a high-fiber diet. This may include eating more fruits, vegetables, and grains.  Taking a fiber supplement.  Taking a live bacteria supplement (probiotic).  Taking medicine to relax your colon.  Taking antibiotic medicines.  Follow these instructions at home:  Drink 6-8 glasses of water or more each day to prevent constipation.  Try not to strain when you have a bowel movement.  If you have had an infection before: ? Eat more fiber as directed by your health care provider or your diet and nutrition specialist (dietitian). ? Take a fiber supplement or probiotic, if your health care provider approves.  Take over-the-counter and prescription medicines only as told by your health care provider.  If you were prescribed an antibiotic, take it as told by your health care provider. Do not stop taking the antibiotic even if you start to feel better.  Keep all follow-up visits as told by your health care provider. This is important. Contact a health care provider if:  You have pain in your abdomen.  You have bloating.  You have cramps.  You have not had a bowel movement in 3 days. Get help right away if:  Your pain gets worse.  Your bloating becomes very bad.  You have a fever or chills, and your symptoms suddenly get worse.  You vomit.  You have bowel movements that are bloody or black.  You have bleeding from your rectum. Summary  Diverticulosis is a condition that develops when small  pouches (diverticula) form in the wall of the large intestine (colon).  You may have a few pouches or many of them.  This condition is most often diagnosed during an exam for other colon problems.  If you have had an infection related to diverticulosis, treatment may include increasing the fiber in your diet, taking supplements, or taking medicines. This information is not intended to replace advice given to you by your health care provider. Make sure you discuss any questions you have with your health care provider. Document Released: 07/23/2004 Document Revised: 09/14/2016 Document Reviewed: 09/14/2016 Elsevier Interactive Patient Education  2017 [employer]     High-Fiber Diet Fiber, also called dietary fiber, is a type of carbohydrate found in fruits, vegetables, whole grains, and beans. A high-fiber diet can have many health benefits. Your health care provider may recommend a high-fiber diet to help:  Prevent constipation. Fiber can make your bowel movements more regular.  Lower your cholesterol.  Relieve hemorrhoids, uncomplicated diverticulosis, or irritable bowel  syndrome.  Prevent overeating as part of a weight-loss plan.  Prevent heart disease, type 2 diabetes, and certain cancers.  What is my plan? The recommended daily intake of fiber includes:  38 grams for men under age 61.  30 grams for men over age 25.  25 grams for women under age 84.  21 grams for women over age 54.  You can get the recommended daily intake of dietary fiber by eating a variety of fruits, vegetables, grains, and beans. Your health care provider may also recommend a fiber supplement if it is not possible to get enough fiber through your diet. What do I need to know about a high-fiber diet?  Fiber supplements have not been widely studied for their effectiveness, so it is better to get fiber through food sources.  Always check the fiber content on thenutrition facts label of any  prepackaged food. Look for foods that contain at least 5 grams of fiber per serving.  Ask your dietitian if you have questions about specific foods that are related to your condition, especially if those foods are not listed in the following section.  Increase your daily fiber consumption gradually. Increasing your intake of dietary fiber too quickly may cause bloating, cramping, or gas.  Drink plenty of water. Water helps you to digest fiber. What foods can I eat? Grains Whole-grain breads. Multigrain cereal. Oats and oatmeal. Brown rice. Barley. Bulgur wheat. Millet. Bran muffins. Popcorn. Rye wafer crackers. Vegetables Sweet potatoes. Spinach. Kale. Artichokes. Cabbage. Broccoli. Green peas. Carrots. Squash. Fruits Berries. Pears. Apples. Oranges. Avocados. Prunes and raisins. Dried figs. Meats and Other Protein Sources Navy, kidney, pinto, and soy beans. Split peas. Lentils. Nuts and seeds. Dairy Fiber-fortified yogurt. Beverages Fiber-fortified soy milk. Fiber-fortified orange juice. Other Fiber bars. The items listed above may not be a complete list of recommended foods or beverages. Contact your dietitian for more options. What foods are not recommended? Grains White bread. Pasta made with refined flour. White rice. Vegetables Fried potatoes. Canned vegetables. Well-cooked vegetables. Fruits Fruit juice. Cooked, strained fruit. Meats and Other Protein Sources Fatty cuts of meat. Fried Environmental education officer or fried fish. Dairy Milk. Yogurt. Cream cheese. Sour cream. Beverages Soft drinks. Other Cakes and pastries. Butter and oils. The items listed above may not be a complete list of foods and beverages to avoid. Contact your dietitian for more information. What are some tips for including high-fiber foods in my diet?  Eat a wide variety of high-fiber foods.  Make sure that half of all grains consumed each day are whole grains.  Replace breads and cereals made from refined flour  or white flour with whole-grain breads and cereals.  Replace white rice with brown rice, bulgur wheat, or millet.  Start the day with a breakfast that is high in fiber, such as a cereal that contains at least 5 grams of fiber per serving.  Use beans in place of meat in soups, salads, or pasta.  Eat high-fiber snacks, such as berries, raw vegetables, nuts, or popcorn. This information is not intended to replace advice given to you by your health care provider. Make sure you discuss any questions you have with your health care provider. Document Released: 10/26/2005 Document Revised: 04/02/2016 Document Reviewed: 04/10/2014 Elsevier Interactive Patient Education  [employer].

## 2018-06-24 NOTE — Op Note (Signed)
[facility] Patient Name: Laura Allen Procedure Date: 06/24/2018 9:14 AM MRN: [id number] Date of Birth: 11-30-1950 Attending MD: Gennette Pac , MD CSN: [id number] Age: 67 Admit Type: Outpatient Procedure:                Colonoscopy Indications:              Screening for colorectal malignant neoplasm Providers:                Gennette Pac, MD, Nena Polio, RN, Burke Keels, Technician Referring MD:              Medicines:                Midazolam 8 mg IV Complications:            No immediate complications. Estimated Blood Loss:     Estimated blood loss: none. Procedure:                Pre-Anesthesia Assessment:                           - Prior to the procedure, a History and Physical                            was performed, and patient medications and                            allergies were reviewed. The patient's tolerance of                            previous anesthesia was also reviewed. The risks                            and benefits of the procedure and the sedation                            options and risks were discussed with the patient.                            All questions were answered, and informed consent                            was obtained. Prior Anticoagulants: The patient has                            taken no previous anticoagulant or antiplatelet                            agents. ASA Grade Assessment: II - A patient with                            mild systemic disease. After reviewing the risks  and benefits, the patient was deemed in                            satisfactory condition to undergo the procedure.                           After obtaining informed consent, the colonoscope                            was passed under direct vision. Throughout the                            procedure, the patient's blood pressure, pulse, and                            oxygen  saturations were monitored continuously. The                            CF-HQ190L ([id number]) scope was introduced through                            the anus and advanced to the the cecum, identified                            by appendiceal orifice and ileocecal valve. The                            colonoscopy was performed without difficulty. The                            patient tolerated the procedure well. The quality                            of the bowel preparation was adequate. The                            ileocecal valve, appendiceal orifice, and rectum                            were photographed. The colonoscopy was performed                            without difficulty. Scope In: 9:35:09 AM Scope Out: 10:08:12 AM Scope Withdrawal Time: 0 hours 8 minutes 25 seconds  Total Procedure Duration: 0 hours 33 minutes 3 seconds  Findings:      The perianal and digital rectal examinations were normal.      Multiple small and large-mouthed diverticula were found in the sigmoid       colon and descending colon. Long redundant colon. Initially, could not       advance the endoscope beyond the sigmoid segment. I withdrew and       obtained a pediatric colonoscope. Remainder of colon redundant. External       abdominal pressure and changing the patient's position required to reach       the  cecum.      The exam was otherwise without abnormality on direct and retroflexion       views. Impression:               - Diverticulosis in the sigmoid colon and in the                            descending colon. Redundant colon.                           - The examination was otherwise normal on direct                            and retroflexion views.                           - No specimens collected. Moderate Sedation:      Moderate (conscious) sedation was administered by the endoscopy nurse       and supervised by the endoscopist. The following parameters were       monitored: oxygen  saturation, heart rate, blood pressure, respiratory       rate, EKG, adequacy of pulmonary ventilation, and response to care.       Total physician intraservice time was 40 minutes. Recommendation:           - Patient has a contact number available for                            emergencies. The signs and symptoms of potential                            delayed complications were discussed with the                            patient. Return to normal activities tomorrow.                            Written discharge instructions were provided to the                            patient.                           - Resume previous diet.                           - Continue present medications.                           - Repeat colonoscopy in 10 years, if overall health                            permits, for screening purposes.                           - Return to GI office PRN. Procedure Code(s):        --- Professional ---  14970, Colonoscopy, flexible; diagnostic, including                            collection of specimen(s) by brushing or washing,                            when performed (separate procedure)                           G0500, Moderate sedation services provided by the                            same physician or other qualified health care                            professional performing a gastrointestinal                            endoscopic service that sedation supports,                            requiring the presence of an independent trained                            observer to assist in the monitoring of the                            patient's level of consciousness and physiological                            status; initial 15 minutes of intra-service time;                            patient age 87 years or older (additional time may                            be reported with 26378, as appropriate)                            [phone], Moderate sedation services provided by the                            same physician or other qualified health care                            professional performing the diagnostic or                            therapeutic service that the sedation supports,                            requiring the presence of an independent trained                            observer  to assist in the monitoring of the                            patient's level of consciousness and physiological                            status; each additional 15 minutes intraservice                            time (List separately in addition to code for                            primary service)                           [phone], Moderate sedation services provided by the                            same physician or other qualified health care                            professional performing the diagnostic or                            therapeutic service that the sedation supports,                            requiring the presence of an independent trained                            observer to assist in the monitoring of the                            patient's level of consciousness and physiological                            status; each additional 15 minutes intraservice                            time (List separately in addition to code for                            primary service) Diagnosis Code(s):        --- Professional ---                           Z12.11, Encounter for screening for malignant                            neoplasm of colon                           K57.30, Diverticulosis of large intestine without  perforation or abscess without bleeding CPT copyright 2017 [employer]. All rights reserved. The codes documented in this report are preliminary and upon coder review may  be revised to meet current compliance requirements. Gerrit Friends. Rourk,  MD Gennette Pac, MD 06/24/2018 10:14:18 AM This report has been signed electronically. Number of Addenda: 0

## 2018-06-29 ENCOUNTER — Encounter (HOSPITAL_COMMUNITY): Payer: Self-pay | Admitting: Internal Medicine

## 2018-08-09 ENCOUNTER — Other Ambulatory Visit: Payer: Self-pay | Admitting: "Endocrinology

## 2018-08-10 DIAGNOSIS — M0579 Rheumatoid arthritis with rheumatoid factor of multiple sites without organ or systems involvement: Secondary | ICD-10-CM | POA: Diagnosis not present

## 2018-08-10 DIAGNOSIS — M2550 Pain in unspecified joint: Secondary | ICD-10-CM | POA: Diagnosis not present

## 2018-08-10 DIAGNOSIS — Z79899 Other long term (current) drug therapy: Secondary | ICD-10-CM | POA: Diagnosis not present

## 2018-08-10 DIAGNOSIS — M150 Primary generalized (osteo)arthritis: Secondary | ICD-10-CM | POA: Diagnosis not present

## 2018-08-18 ENCOUNTER — Encounter: Payer: Self-pay | Admitting: "Endocrinology

## 2018-09-22 ENCOUNTER — Ambulatory Visit: Payer: BC Managed Care – PPO | Admitting: "Endocrinology

## 2018-09-26 ENCOUNTER — Other Ambulatory Visit: Payer: Self-pay | Admitting: "Endocrinology

## 2018-09-26 DIAGNOSIS — E039 Hypothyroidism, unspecified: Secondary | ICD-10-CM | POA: Diagnosis not present

## 2018-09-26 DIAGNOSIS — E042 Nontoxic multinodular goiter: Secondary | ICD-10-CM

## 2018-09-26 NOTE — Progress Notes (Signed)
u

## 2018-09-27 LAB — T4, FREE: Free T4: 1.3 ng/dL (ref 0.8–1.8)

## 2018-09-27 LAB — TSH: TSH: 2.59 mIU/L (ref 0.40–4.50)

## 2018-09-28 ENCOUNTER — Other Ambulatory Visit: Payer: Self-pay | Admitting: "Endocrinology

## 2018-09-28 DIAGNOSIS — E042 Nontoxic multinodular goiter: Secondary | ICD-10-CM

## 2018-09-30 ENCOUNTER — Encounter: Payer: Self-pay | Admitting: "Endocrinology

## 2018-09-30 ENCOUNTER — Ambulatory Visit: Payer: BC Managed Care – PPO | Admitting: "Endocrinology

## 2018-09-30 VITALS — Ht 67.0 in

## 2018-09-30 DIAGNOSIS — E042 Nontoxic multinodular goiter: Secondary | ICD-10-CM

## 2018-09-30 DIAGNOSIS — E039 Hypothyroidism, unspecified: Secondary | ICD-10-CM | POA: Diagnosis not present

## 2018-09-30 NOTE — Progress Notes (Signed)
    Endocrinology follow-up note  Laura Allen is a 67 y.o.-year-old female, here to follow up for her hypothyroidism.  She is on levothyroxine 100 g, by mouth every morning. She is compliant. No new complaints.  She is compliant to her medications. Her weight stayed steady since last visit.  She reports to have been diagnosed with hypothyroidism at age 7 yrs  and has been on various doses of thyroid hormone ever since, until it was stabilized at 100 g by mouth daily. I reviewed pt's thyroid tests: Consistent with appropriate replacement. She denies cold intolerance, heat intolerance. No h/o radiation tx to head or neck. She has had multiple bilateral subcentimeter nodules and ultrasound study of the thyroid in 2011, and in 2017.  He missed her previsit thyroid ultrasound.   ROS: Constitutional: + Steady weight. - no subjective hyperthermia/hypothermia Eyes: no blurry vision, no xerophthalmia ENT: no sore throat, no nodules palpated in throat, no dysphagia/odynophagia, no hoarseness Musculoskeletal: no muscle/joint aches Skin: no rashes Neurological: no tremors/numbness/tingling/dizziness Psychiatric: no depression/anxiety  PE: Ht 5\' 7"  (1.702 m)   BMI 36.02 kg/m  Wt Readings from Last 3 Encounters:  09/22/17 230 lb (104.3 kg)  03/22/17 230 lb (104.3 kg)  02/11/17 222 lb (100.7 kg)   Constitutional: overweight, in NAD Eyes: PERRLA, EOMI, no exophthalmos ENT: moist mucous membranes, no gross change in thyroid exam, no cervical lymphadenopathy Musculoskeletal: no deformities, strength intact in all 4 Skin: moist, warm, no rashes Neurological: no tremor with outstretched hands, DTR normal in all 4   Recent Results (from the past 2160 hour(s))  T4, Free     Status: None   Collection Time: 09/26/18  9:28 AM  Result Value Ref Range   Free T4 1.3 0.8 - 1.8 ng/dL  TSH     Status: None   Collection Time: 09/26/18  9:28 AM  Result Value Ref Range   TSH 2.59 0.40 - 4.50  mIU/L    ASSESSMENT: 1. Hypothyroidism 2. Multinodular goiter  PLAN:    Patient with long-standing hypothyroidism, on levothyroxine therapy.  -Her thyroid function tests are consistent with appropriate replacement.  She is advised to continue  levothyroxine  100 g by mouth every morning.   - We discussed about correct intake of levothyroxine, at fasting, with water, separated by at least 30 minutes from breakfast, and separated by more than 4 hours from calcium, iron, multivitamins, acid reflux medications (PPIs). -Patient is made aware of the fact that thyroid hormone replacement is needed for life, dose to be adjusted by periodic monitoring of thyroid function tests. -  Her follow-up ultrasound of the thyroid shows normal size thyroid with 1.1 cm left lobe nodule previously not observed.  She missed her previsit thyroid ultrasound appointment.  She is approached again to do it as soon as possible, will be called if significant findings.  If normal will be discussed during her next visit in 45-month.    8-month, MD Phone: [phone]  Fax: [phone]  -  This note was partially dictated with voice recognition software. Similar sounding words can be transcribed inadequately or may not  be corrected upon review. 09/30/2018, 1:08 PM

## 2018-10-17 DIAGNOSIS — M0579 Rheumatoid arthritis with rheumatoid factor of multiple sites without organ or systems involvement: Secondary | ICD-10-CM | POA: Diagnosis not present

## 2018-10-26 ENCOUNTER — Ambulatory Visit (HOSPITAL_COMMUNITY)
Admission: RE | Admit: 2018-10-26 | Discharge: 2018-10-26 | Disposition: A | Discharge status: Home / Self Care | Payer: Medicare Other | Source: Ambulatory Visit | Attending: "Endocrinology | Admitting: "Endocrinology

## 2018-10-26 ENCOUNTER — Other Ambulatory Visit: Payer: Self-pay | Admitting: "Endocrinology

## 2018-10-26 DIAGNOSIS — E042 Nontoxic multinodular goiter: Secondary | ICD-10-CM | POA: Diagnosis not present

## 2018-10-28 NOTE — Progress Notes (Signed)
Left 2 message for pt. She has not returned cal.. Sent the order to [facility] for scheduling.

## 2018-11-07 DIAGNOSIS — Z6837 Body mass index (BMI) 37.0-37.9, adult: Secondary | ICD-10-CM | POA: Diagnosis not present

## 2018-11-07 DIAGNOSIS — I10 Essential (primary) hypertension: Secondary | ICD-10-CM | POA: Diagnosis not present

## 2018-11-07 DIAGNOSIS — J329 Chronic sinusitis, unspecified: Secondary | ICD-10-CM | POA: Diagnosis not present

## 2018-11-10 ENCOUNTER — Encounter (HOSPITAL_COMMUNITY): Payer: Self-pay

## 2018-11-10 ENCOUNTER — Ambulatory Visit (HOSPITAL_COMMUNITY)
Admission: RE | Admit: 2018-11-10 | Discharge: 2018-11-10 | Disposition: A | Discharge status: Home / Self Care | Payer: Medicare Other | Source: Ambulatory Visit | Attending: "Endocrinology | Admitting: "Endocrinology

## 2018-11-10 DIAGNOSIS — E041 Nontoxic single thyroid nodule: Secondary | ICD-10-CM | POA: Diagnosis not present

## 2018-11-10 DIAGNOSIS — E042 Nontoxic multinodular goiter: Secondary | ICD-10-CM | POA: Insufficient documentation

## 2018-11-10 MED ORDER — LIDOCAINE HCL (PF) 2 % IJ SOLN
INTRAMUSCULAR | Status: AC
  Administered 2018-11-10: 5 mL
  Filled 2018-11-10: qty 20

## 2018-11-17 DIAGNOSIS — M2550 Pain in unspecified joint: Secondary | ICD-10-CM | POA: Diagnosis not present

## 2018-11-17 DIAGNOSIS — Z6836 Body mass index (BMI) 36.0-36.9, adult: Secondary | ICD-10-CM | POA: Diagnosis not present

## 2018-11-17 DIAGNOSIS — M0579 Rheumatoid arthritis with rheumatoid factor of multiple sites without organ or systems involvement: Secondary | ICD-10-CM | POA: Diagnosis not present

## 2018-11-17 DIAGNOSIS — E669 Obesity, unspecified: Secondary | ICD-10-CM | POA: Diagnosis not present

## 2018-12-15 DIAGNOSIS — J209 Acute bronchitis, unspecified: Secondary | ICD-10-CM | POA: Diagnosis not present

## 2018-12-15 DIAGNOSIS — Z6837 Body mass index (BMI) 37.0-37.9, adult: Secondary | ICD-10-CM | POA: Diagnosis not present

## 2018-12-15 DIAGNOSIS — J22 Unspecified acute lower respiratory infection: Secondary | ICD-10-CM | POA: Diagnosis not present

## 2019-01-17 DIAGNOSIS — Z6836 Body mass index (BMI) 36.0-36.9, adult: Secondary | ICD-10-CM | POA: Diagnosis not present

## 2019-01-17 DIAGNOSIS — M2550 Pain in unspecified joint: Secondary | ICD-10-CM | POA: Diagnosis not present

## 2019-01-17 DIAGNOSIS — E669 Obesity, unspecified: Secondary | ICD-10-CM | POA: Diagnosis not present

## 2019-01-17 DIAGNOSIS — M0579 Rheumatoid arthritis with rheumatoid factor of multiple sites without organ or systems involvement: Secondary | ICD-10-CM | POA: Diagnosis not present

## 2019-02-06 DIAGNOSIS — Z6837 Body mass index (BMI) 37.0-37.9, adult: Secondary | ICD-10-CM | POA: Diagnosis not present

## 2019-02-06 DIAGNOSIS — J302 Other seasonal allergic rhinitis: Secondary | ICD-10-CM | POA: Diagnosis not present

## 2019-02-06 DIAGNOSIS — J45909 Unspecified asthma, uncomplicated: Secondary | ICD-10-CM | POA: Diagnosis not present

## 2019-03-07 ENCOUNTER — Other Ambulatory Visit: Payer: Self-pay | Admitting: "Endocrinology

## 2019-03-22 DIAGNOSIS — E039 Hypothyroidism, unspecified: Secondary | ICD-10-CM | POA: Diagnosis not present

## 2019-03-23 LAB — TSH: TSH: 1.42 mIU/L (ref 0.40–4.50)

## 2019-03-23 LAB — T4, FREE: FREE T4: 1.6 ng/dL (ref 0.8–1.8)

## 2019-03-30 ENCOUNTER — Encounter: Payer: Self-pay | Admitting: "Endocrinology

## 2019-03-31 ENCOUNTER — Encounter: Payer: Self-pay | Admitting: "Endocrinology

## 2019-03-31 ENCOUNTER — Other Ambulatory Visit: Payer: Self-pay

## 2019-03-31 ENCOUNTER — Ambulatory Visit (INDEPENDENT_AMBULATORY_CARE_PROVIDER_SITE_OTHER): Payer: Medicare Other | Admitting: "Endocrinology

## 2019-03-31 DIAGNOSIS — E042 Nontoxic multinodular goiter: Secondary | ICD-10-CM | POA: Diagnosis not present

## 2019-03-31 DIAGNOSIS — E039 Hypothyroidism, unspecified: Secondary | ICD-10-CM | POA: Diagnosis not present

## 2019-03-31 NOTE — Progress Notes (Signed)
03/31/2019                                Endocrinology Telehealth Visit Follow up Note -During COVID -19 Pandemic  I connected with Laura Allen on 03/31/2019   by telephone and verified that I am speaking with the correct person using two identifiers. Laura Allen, 05/20/1951. she has verbally consented to this visit. All issues noted in this document were discussed and addressed. The format was not optimal for physical exam.   Laura Allen is a 68 y.o.-year-old female, is being engaged in telehealth for follow-up of hypothyroidism, and nodular goiter.   She is on levothyroxine 100 g, by mouth every morning. She is compliant. No new complaints.  She is compliant to her medications. Her weight stayed steady since last visit.  She reports to have been diagnosed with hypothyroidism at age 53 yrs  and has been on various doses of thyroid hormone ever since, until it was stabilized at 100 g by mouth daily. -Her previsit thyroid function tests are consistent with appropriate replacement. She denies cold intolerance, heat intolerance. No h/o radiation tx to head or neck.   -She was found to have mildly suspicious 1 cm nodule on the left lobe of her thyroid, attempt to biopsy did not yield diagnostic material.  She has no complaints of dysphagia, voice change nor shortness of breath.     ROS:  Limited as above.   PE: There were no vitals taken for this visit. Wt Readings from Last 3 Encounters:  09/22/17 230 lb (104.3 kg)  03/22/17 230 lb (104.3 kg)  02/11/17 222 lb (100.7 kg)      Recent Results (from the past 2160 hour(s))  TSH     Status: None   Collection Time: 03/22/19  8:42 AM  Result Value Ref Range   TSH 1.42 0.40 - 4.50 mIU/L  T4, free     Status: None   Collection Time: 03/22/19  8:42 AM  Result Value Ref Range   Free T4 1.6 0.8 - 1.8 ng/dL    ASSESSMENT: 1. Hypothyroidism 2. Multinodular goiter  PLAN:    Patient with long-standing hypothyroidism,  on levothyroxine therapy.  -Her thyroid function tests are consistent with appropriate replacement.  She is advised to continue  levothyroxine  100 g by mouth every morning.   - We discussed about the correct intake of her thyroid hormone, on empty stomach at fasting, with water, separated by at least 30 minutes from breakfast and other medications,  and separated by more than 4 hours from calcium, iron, multivitamins, acid reflux medications (PPIs). -Patient is made aware of the fact that thyroid hormone replacement is needed for life, dose to be adjusted by periodic monitoring of thyroid function tests.  -  Her follow-up ultrasound of the thyroid shows normal size thyroid with 1.1 cm left lobe nodule previously not observed.  An attempt to biopsy did not yield satisfactory samples.  This was discussed with her in detail.  She will have repeat thyroid ultrasound in 6 months with office visit.   Time for this visit 15 minutes.  Laura Allen participated in the discussions, expressed understanding, and voiced agreement with the above plans.  All questions were answered to her satisfaction. she is encouraged to contact clinic should she have any questions or concerns prior to her return visit.  Marquis Lunch, MD Phone: [phone]  Fax: [phone]  -  This note was partially dictated with voice recognition software. Similar sounding words can be transcribed inadequately or may not  be corrected upon review. 03/31/2019, 11:22 AM

## 2019-04-25 DIAGNOSIS — M0579 Rheumatoid arthritis with rheumatoid factor of multiple sites without organ or systems involvement: Secondary | ICD-10-CM | POA: Diagnosis not present

## 2019-04-25 DIAGNOSIS — Z79899 Other long term (current) drug therapy: Secondary | ICD-10-CM | POA: Diagnosis not present

## 2019-04-25 DIAGNOSIS — M2550 Pain in unspecified joint: Secondary | ICD-10-CM | POA: Diagnosis not present

## 2019-04-25 DIAGNOSIS — M150 Primary generalized (osteo)arthritis: Secondary | ICD-10-CM | POA: Diagnosis not present

## 2019-05-23 DIAGNOSIS — M0579 Rheumatoid arthritis with rheumatoid factor of multiple sites without organ or systems involvement: Secondary | ICD-10-CM | POA: Diagnosis not present

## 2019-06-06 DIAGNOSIS — Z681 Body mass index (BMI) 19 or less, adult: Secondary | ICD-10-CM | POA: Diagnosis not present

## 2019-06-06 DIAGNOSIS — J45909 Unspecified asthma, uncomplicated: Secondary | ICD-10-CM | POA: Diagnosis not present

## 2019-06-06 DIAGNOSIS — J069 Acute upper respiratory infection, unspecified: Secondary | ICD-10-CM | POA: Diagnosis not present

## 2019-07-26 DIAGNOSIS — M150 Primary generalized (osteo)arthritis: Secondary | ICD-10-CM | POA: Diagnosis not present

## 2019-07-26 DIAGNOSIS — Z79899 Other long term (current) drug therapy: Secondary | ICD-10-CM | POA: Diagnosis not present

## 2019-07-26 DIAGNOSIS — M2550 Pain in unspecified joint: Secondary | ICD-10-CM | POA: Diagnosis not present

## 2019-07-26 DIAGNOSIS — M0579 Rheumatoid arthritis with rheumatoid factor of multiple sites without organ or systems involvement: Secondary | ICD-10-CM | POA: Diagnosis not present

## 2019-07-31 DIAGNOSIS — Z0001 Encounter for general adult medical examination with abnormal findings: Secondary | ICD-10-CM | POA: Diagnosis not present

## 2019-07-31 DIAGNOSIS — Z6837 Body mass index (BMI) 37.0-37.9, adult: Secondary | ICD-10-CM | POA: Diagnosis not present

## 2019-07-31 DIAGNOSIS — Z1389 Encounter for screening for other disorder: Secondary | ICD-10-CM | POA: Diagnosis not present

## 2019-07-31 DIAGNOSIS — J22 Unspecified acute lower respiratory infection: Secondary | ICD-10-CM | POA: Diagnosis not present

## 2019-07-31 DIAGNOSIS — Z0000 Encounter for general adult medical examination without abnormal findings: Secondary | ICD-10-CM | POA: Diagnosis not present

## 2019-08-09 ENCOUNTER — Other Ambulatory Visit: Payer: Self-pay | Admitting: "Endocrinology

## 2019-08-09 DIAGNOSIS — E042 Nontoxic multinodular goiter: Secondary | ICD-10-CM

## 2019-09-01 ENCOUNTER — Ambulatory Visit (HOSPITAL_COMMUNITY)
Admission: RE | Admit: 2019-09-01 | Discharge: 2019-09-01 | Disposition: A | Discharge status: Home / Self Care | Payer: Medicare Other | Source: Ambulatory Visit | Attending: "Endocrinology | Admitting: "Endocrinology

## 2019-09-01 ENCOUNTER — Other Ambulatory Visit: Payer: Self-pay

## 2019-09-01 DIAGNOSIS — E042 Nontoxic multinodular goiter: Secondary | ICD-10-CM | POA: Diagnosis not present

## 2019-09-22 DIAGNOSIS — E039 Hypothyroidism, unspecified: Secondary | ICD-10-CM | POA: Diagnosis not present

## 2019-09-23 LAB — TSH: TSH: 1.44 mIU/L (ref 0.40–4.50)

## 2019-09-23 LAB — T4, FREE: Free T4: 1.7 ng/dL (ref 0.8–1.8)

## 2019-09-29 ENCOUNTER — Ambulatory Visit (INDEPENDENT_AMBULATORY_CARE_PROVIDER_SITE_OTHER): Payer: Medicare Other | Admitting: "Endocrinology

## 2019-09-29 ENCOUNTER — Encounter: Payer: Self-pay | Admitting: "Endocrinology

## 2019-09-29 ENCOUNTER — Other Ambulatory Visit: Payer: Self-pay

## 2019-09-29 DIAGNOSIS — E039 Hypothyroidism, unspecified: Secondary | ICD-10-CM

## 2019-09-29 MED ORDER — LEVOTHYROXINE SODIUM 100 MCG PO TABS: 100.0000 ug | ORAL_TABLET | Freq: Every day | ORAL | 3 refills | Status: DC

## 2019-09-29 NOTE — Progress Notes (Signed)
09/29/2019                                Endocrinology Telehealth Visit Follow up Note -During COVID -19 Pandemic  I connected with Laura Allen on 09/29/2019   by telephone and verified that I am speaking with the correct person using two identifiers. Laura Allen, [birth year]. she has verbally consented to this visit. All issues noted in this document were discussed and addressed. The format was not optimal for physical exam.   Laura Allen is a 68 y.o.-year-old female, is being engaged in telehealth for follow-up of hypothyroidism, and nodular goiter.   She is on levothyroxine 100 mcg p.o. daily before breakfast.  She is compliant. No new complaints.  She is compliant to her medications. Her weight stayed steady since last visit.  She reports to have been diagnosed with hypothyroidism at age 20 yrs  and has been on various doses of thyroid hormone ever since, until it was stabilized at 100 g by mouth daily. -Her previsit thyroid function tests are consistent with appropriate replacement. She denies cold intolerance, heat intolerance. No h/o radiation tx to head or neck.   -She was found to have mildly suspicious 1 cm nodule on the left lobe of her thyroid, attempt to biopsy did not yield diagnostic material.  She has no complaints of dysphagia, voice change nor shortness of breath.  Her previsit thyroid ultrasound is unremarkable.   ROS:  Limited as above.   PE: There were no vitals taken for this visit. Wt Readings from Last 3 Encounters:  09/22/17 230 lb (104.3 kg)  03/22/17 230 lb (104.3 kg)  02/11/17 222 lb (100.7 kg)      Recent Results (from the past 2160 hour(s))  TSH     Status: None   Collection Time: 09/22/19  9:44 AM  Result Value Ref Range   TSH 1.44 0.40 - 4.50 mIU/L  T4, free     Status: None   Collection Time: 09/22/19  9:44 AM  Result Value Ref Range   Free T4 1.7 0.8 - 1.8 ng/dL    ASSESSMENT: 1. Hypothyroidism 2. Multinodular  goiter  PLAN:    Patient with long-standing hypothyroidism, on levothyroxine therapy.  -Her thyroid function tests are consistent with appropriate replacement.  She is advised to continue  levothyroxine  100 g by mouth every morning.   - We discussed about the correct intake of her thyroid hormone, on empty stomach at fasting, with water, separated by at least 30 minutes from breakfast and other medications,  and separated by more than 4 hours from calcium, iron, multivitamins, acid reflux medications (PPIs). -Patient is made aware of the fact that thyroid hormone replacement is needed for life, dose to be adjusted by periodic monitoring of thyroid function tests.   -  Her follow-up ultrasound of the thyroid shows stable findings, unremarkable.  She would not need any intervention at this time.     Time for this visit: 15 minutes. Laura Allen  participated in the discussions, expressed understanding, and voiced agreement with the above plans.  All questions were answered to her satisfaction. she is encouraged to contact clinic should she have any questions or concerns prior to her return visit.   Marquis Lunch, MD Phone: [phone]  Fax: [phone]  -  This note was partially dictated with voice recognition software. Similar sounding words can be transcribed inadequately or may  not  be corrected upon review. 09/29/2019, 12:29 PM

## 2019-10-02 DIAGNOSIS — J329 Chronic sinusitis, unspecified: Secondary | ICD-10-CM | POA: Diagnosis not present

## 2019-10-02 DIAGNOSIS — J209 Acute bronchitis, unspecified: Secondary | ICD-10-CM | POA: Diagnosis not present

## 2019-10-02 DIAGNOSIS — Z6837 Body mass index (BMI) 37.0-37.9, adult: Secondary | ICD-10-CM | POA: Diagnosis not present

## 2019-10-02 DIAGNOSIS — J45909 Unspecified asthma, uncomplicated: Secondary | ICD-10-CM | POA: Diagnosis not present

## 2019-10-03 ENCOUNTER — Other Ambulatory Visit: Payer: Self-pay

## 2019-10-03 ENCOUNTER — Ambulatory Visit (HOSPITAL_COMMUNITY)
Admission: RE | Admit: 2019-10-03 | Discharge: 2019-10-03 | Disposition: A | Discharge status: Home / Self Care | Payer: Medicare Other | Source: Ambulatory Visit | Attending: Internal Medicine | Admitting: Internal Medicine

## 2019-10-03 ENCOUNTER — Other Ambulatory Visit (HOSPITAL_COMMUNITY): Payer: Self-pay | Admitting: Internal Medicine

## 2019-10-03 DIAGNOSIS — R0602 Shortness of breath: Secondary | ICD-10-CM

## 2019-10-03 DIAGNOSIS — R05 Cough: Secondary | ICD-10-CM

## 2019-10-03 DIAGNOSIS — Z20822 Contact with and (suspected) exposure to covid-19: Secondary | ICD-10-CM

## 2019-10-03 DIAGNOSIS — R059 Cough, unspecified: Secondary | ICD-10-CM

## 2019-10-04 LAB — NOVEL CORONAVIRUS, NAA: SARS-CoV-2, NAA: NOT DETECTED

## 2019-10-25 DIAGNOSIS — Z79899 Other long term (current) drug therapy: Secondary | ICD-10-CM | POA: Diagnosis not present

## 2019-10-25 DIAGNOSIS — M150 Primary generalized (osteo)arthritis: Secondary | ICD-10-CM | POA: Diagnosis not present

## 2019-10-25 DIAGNOSIS — M2550 Pain in unspecified joint: Secondary | ICD-10-CM | POA: Diagnosis not present

## 2019-10-25 DIAGNOSIS — M0579 Rheumatoid arthritis with rheumatoid factor of multiple sites without organ or systems involvement: Secondary | ICD-10-CM | POA: Diagnosis not present

## 2019-11-07 DIAGNOSIS — M810 Age-related osteoporosis without current pathological fracture: Secondary | ICD-10-CM | POA: Diagnosis not present

## 2019-11-22 ENCOUNTER — Encounter: Payer: Self-pay | Admitting: Allergy & Immunology

## 2019-11-22 ENCOUNTER — Other Ambulatory Visit: Payer: Self-pay

## 2019-11-22 ENCOUNTER — Ambulatory Visit (INDEPENDENT_AMBULATORY_CARE_PROVIDER_SITE_OTHER): Payer: Medicare Other | Admitting: Allergy & Immunology

## 2019-11-22 VITALS — BP 132/84 | HR 90 | Temp 97.6°F | Resp 18 | Ht 66.5 in | Wt 221.6 lb

## 2019-11-22 DIAGNOSIS — J302 Other seasonal allergic rhinitis: Secondary | ICD-10-CM | POA: Diagnosis not present

## 2019-11-22 DIAGNOSIS — J4540 Moderate persistent asthma, uncomplicated: Secondary | ICD-10-CM | POA: Diagnosis not present

## 2019-11-22 DIAGNOSIS — J3089 Other allergic rhinitis: Secondary | ICD-10-CM

## 2019-11-22 MED ORDER — AZELASTINE-FLUTICASONE 137-50 MCG/ACT NA SUSP: 2.0000 | Freq: Two times a day (BID) | NASAL | 5 refills | Status: DC | PRN

## 2019-11-22 MED ORDER — BREO ELLIPTA 100-25 MCG/INH IN AEPB: 1.0000 | INHALATION_SPRAY | Freq: Every day | RESPIRATORY_TRACT | 3 refills | Status: DC

## 2019-11-22 NOTE — Patient Instructions (Addendum)
1. Moderate persistent asthma, uncomplicated - Lung testing actually looked fairly good. - We are going to add on Breo one puff once daily to help prevent the need for systemic steroids so often. - Cigarette smoke exposure is also making your breathing worse. - Cigarette smoke interrupts the function of cilia in the lungs, which help you to remove viruses and allergens from your lung.  - Spacer sample and demonstration provided. - Daily controller medication(s): Breo 100/51mcg one puff once daily - Prior to physical activity: albuterol 2 puffs 10-15 minutes before physical activity. - Rescue medications: albuterol 4 puffs every 4-6 hours as needed or albuterol nebulizer one vial every 4-6 hours as needed - Asthma control goals:  * Full participation in all desired activities (may need albuterol before activity) * Albuterol use two time or less a week on average (not counting use with activity) * Cough interfering with sleep two time or less a month * Oral steroids no more than once a year * No hospitalizations  2. Chronic rhinitis - Testing today showed: grasses, weeds, trees, indoor molds, outdoor molds, dust mites, dog and cockroach - Copy of test results provided.  - Avoidance measures provided. - Start taking: Zyrtec (cetirizine) 10mg  tablet once daily and Dymista (fluticasone/azelastine) two sprays per nostril 1-2 times daily - You can use an extra dose of the antihistamine, if needed, for breakthrough symptoms.  - Consider nasal saline rinses 1-2 times daily to remove allergens from the nasal cavities as well as help with mucous clearance (this is especially helpful to do before the nasal sprays are given) - Consider allergy shots as a means of long-term control. - Allergy shots "re-train" and "reset" the immune system to ignore environmental allergens and decrease the resulting immune response to those allergens (sneezing, itchy watery eyes, runny nose, nasal congestion, etc).    -  Allergy shots improve symptoms in 75-85% of patients.  - We can discuss more at the next appointment if the medications are not working for you.  3. Return in about 2 months (around 01/20/2020). This can be an in-person, a virtual Webex or a telephone follow up visit.   Please inform 01/22/2020 of any Emergency Department visits, hospitalizations, or changes in symptoms. Call us before going to the ED for breathing or allergy symptoms since we might be able to fit you in for a sick visit. Feel free to contact us anytime with any questions, problems, or concerns.  It was a pleasure to meet you today!  Websites that have reliable patient information: 1. American Academy of Asthma, Allergy, and Immunology: www.aaaai.org 2. Food Allergy Research and Education (FARE): foodallergy.org 3. Mothers of Asthmatics: http://www.asthmacommunitynetwork.org 4. American College of Allergy, Asthma, and Immunology: www.acaai.org   COVID-19 Vaccine Information can be found at: [place] For questions related to vaccine distribution or appointments, please email [email or web address] or call [phone].     "Like" [phone] on Facebook and Instagram for our latest updates!        Make sure you are registered to vote! If you have moved or changed any of your contact information, you will need to get this updated before voting!  In some cases, you MAY be able to register to vote online: [place]    Reducing Pollen Exposure  The American Academy of Allergy, Asthma and Immunology suggests the following steps to reduce your exposure to pollen during allergy seasons.    1. Do not hang sheets or clothing out to dry; pollen may collect on these items.  2. Do not mow lawns or spend time around freshly cut grass; mowing stirs up pollen. 3. Keep windows closed at night.  Keep car windows closed while  driving. 4. Minimize morning activities outdoors, a time when pollen counts are usually at their highest. 5. Stay indoors as much as possible when pollen counts or humidity is high and on windy days when pollen tends to remain in the air longer. 6. Use air conditioning when possible.  Many air conditioners have filters that trap the pollen spores. 7. Use a HEPA room air filter to remove pollen form the indoor air you breathe.  Control of Mold Allergen   Mold and fungi can grow on a variety of surfaces provided certain temperature and moisture conditions exist.  Outdoor molds grow on plants, decaying vegetation and soil.  The major outdoor mold, Alternaria and Cladosporium, are found in very high numbers during hot and dry conditions.  Generally, a late Summer - Fall peak is seen for common outdoor fungal spores.  Rain will temporarily lower outdoor mold spore count, but counts rise rapidly when the rainy period ends.  The most important indoor molds are Aspergillus and Penicillium.  Dark, humid and poorly ventilated basements are ideal sites for mold growth.  The next most common sites of mold growth are the bathroom and the kitchen.  Outdoor (Seasonal) Mold Control  Positive outdoor molds via skin testing: Bipolaris (Helminthsporium), Drechslera (Curvalaria) and Mucor  1. Use air conditioning and keep windows closed 2. Avoid exposure to decaying vegetation. 3. Avoid leaf raking. 4. Avoid grain handling. 5. Consider wearing a face mask if working in moldy areas.  6.   Indoor (Perennial) Mold Control   Positive indoor molds via skin testing: Aspergillus, Penicillium, Fusarium, Aureobasidium (Pullulara) and Rhizopus  1. Maintain humidity below 50%. 2. Clean washable surfaces with 5% bleach solution. 3. Remove sources e.g. contaminated carpets.     Control of Dog or Cat Allergen  Avoidance is the best way to manage a dog or cat allergy. If you have a dog or cat and are allergic to dog  or cats, consider removing the dog or cat from the home. If you have a dog or cat but don't want to find it a new home, or if your family wants a pet even though someone in the household is allergic, here are some strategies that may help keep symptoms at bay:  1. Keep the pet out of your bedroom and restrict it to only a few rooms. Be advised that keeping the dog or cat in only one room will not limit the allergens to that room. 2. Don't pet, hug or kiss the dog or cat; if you do, wash your hands with soap and water. 3. High-efficiency particulate air (HEPA) cleaners run continuously in a bedroom or living room can reduce allergen levels over time. 4. Regular use of a high-efficiency vacuum cleaner or a central vacuum can reduce allergen levels. 5. Giving your dog or cat a bath at least once a week can reduce airborne allergen.  Control of Dust Mite Allergen    Dust mites play a major role in allergic asthma and rhinitis.  They occur in environments with high humidity wherever human skin is found.  Dust mites absorb humidity from the atmosphere (ie, they do not drink) and feed on organic matter (including shed human and animal skin).  Dust mites are a microscopic type of insect that you cannot see with the naked eye.  High levels  of dust mites have been detected from mattresses, pillows, carpets, upholstered furniture, bed covers, clothes, soft toys and any woven material.  The principal allergen of the dust mite is found in its feces.  A gram of dust may contain 1,000 mites and 250,000 fecal particles.  Mite antigen is easily measured in the air during house cleaning activities.  Dust mites do not bite and do not cause harm to humans, other than by triggering allergies/asthma.    Ways to decrease your exposure to dust mites in your home:  1. Encase mattresses, box springs and pillows with a mite-impermeable barrier or cover   2. Wash sheets, blankets and drapes weekly in hot water (130 F) with  detergent and dry them in a dryer on the hot setting.  3. Have the room cleaned frequently with a vacuum cleaner and a damp dust-mop.  For carpeting or rugs, vacuuming with a vacuum cleaner equipped with a high-efficiency particulate air (HEPA) filter.  The dust mite allergic individual should not be in a room which is being cleaned and should wait 1 hour after cleaning before going into the room. 4. Do not sleep on upholstered furniture (eg, couches).   5. If possible removing carpeting, upholstered furniture and drapery from the home is ideal.  Horizontal blinds should be eliminated in the rooms where the person spends the most time (bedroom, study, television room).  Washable vinyl, roller-type shades are optimal. 6. Remove all non-washable stuffed toys from the bedroom.  Wash stuffed toys weekly like sheets and blankets above.   7. Reduce indoor humidity to less than 50%.  Inexpensive humidity monitors can be purchased at most hardware stores.  Do not use a humidifier as can make the problem worse and are not recommended.   Control of Cockroach Allergen  Cockroach allergen has been identified as an important cause of acute attacks of asthma, especially in urban settings.  There are fifty-five species of cockroach that exist in the [place], however only three, the [place], [place] species produce allergen that can affect patients with Asthma.  Allergens can be obtained from fecal particles, egg casings and secretions from cockroaches.    1. Remove food sources. 2. Reduce access to water. 3. Seal access and entry points. 4. Spray runways with 0.5-1% Diazinon or Chlorpyrifos 5. Blow boric acid power under stoves and refrigerator. 6. Place bait stations (hydramethylnon) at feeding sites.  Allergy Shots   Allergies are the result of a chain reaction that starts in the immune system. Your immune system controls how your body defends itself. For instance, if you have an allergy  to pollen, your immune system identifies pollen as an invader or allergen. Your immune system overreacts by producing antibodies called Immunoglobulin E (IgE). These antibodies travel to cells that release chemicals, causing an allergic reaction.  The concept behind allergy immunotherapy, whether it is received in the form of shots or tablets, is that the immune system can be desensitized to specific allergens that trigger allergy symptoms. Although it requires time and patience, the payback can be long-term relief.  How Do Allergy Shots Work?  Allergy shots work much like a vaccine. Your body responds to injected amounts of a particular allergen given in increasing doses, eventually developing a resistance and tolerance to it. Allergy shots can lead to decreased, minimal or no allergy symptoms.  There generally are two phases: build-up and maintenance. Build-up often ranges from three to six months and involves receiving injections with increasing amounts  of the allergens. The shots are typically given once or twice a week, though more rapid build-up schedules are sometimes used.  The maintenance phase begins when the most effective dose is reached. This dose is different for each person, depending on how allergic you are and your response to the build-up injections. Once the maintenance dose is reached, there are longer periods between injections, typically two to four weeks.  Occasionally doctors give cortisone-type shots that can temporarily reduce allergy symptoms. These types of shots are different and should not be confused with allergy immunotherapy shots.  Who Can Be Treated with Allergy Shots?  Allergy shots may be a good treatment approach for people with allergic rhinitis (hay fever), allergic asthma, conjunctivitis (eye allergy) or stinging insect allergy.   Before deciding to begin allergy shots, you should consider:  . The length of allergy season and the severity of your  symptoms . Whether medications and/or changes to your environment can control your symptoms . Your desire to avoid long-term medication use . Time: allergy immunotherapy requires a major time commitment . Cost: may vary depending on your insurance coverage  Allergy shots for children age 39 and older are effective and often well tolerated. They might prevent the onset of new allergen sensitivities or the progression to asthma.  Allergy shots are not started on patients who are pregnant but can be continued on patients who become pregnant while receiving them. In some patients with other medical conditions or who take certain common medications, allergy shots may be of risk. It is important to mention other medications you talk to your allergist.   When Will I Feel Better?  Some may experience decreased allergy symptoms during the build-up phase. For others, it may take as long as 12 months on the maintenance dose. If there is no improvement after a year of maintenance, your allergist will discuss other treatment options with you.  If you aren't responding to allergy shots, it may be because there is not enough dose of the allergen in your vaccine or there are missing allergens that were not identified during your allergy testing. Other reasons could be that there are high levels of the allergen in your environment or major exposure to non-allergic triggers like tobacco smoke.  What Is the Length of Treatment?  Once the maintenance dose is reached, allergy shots are generally continued for three to five years. The decision to stop should be discussed with your allergist at that time. Some people may experience a permanent reduction of allergy symptoms. Others may relapse and a longer course of allergy shots can be considered.  What Are the Possible Reactions?  The two types of adverse reactions that can occur with allergy shots are local and systemic. Common local reactions include very mild  redness and swelling at the injection site, which can happen immediately or several hours after. A systemic reaction, which is less common, affects the entire body or a particular body system. They are usually mild and typically respond quickly to medications. Signs include increased allergy symptoms such as sneezing, a stuffy nose or hives.  Rarely, a serious systemic reaction called anaphylaxis can develop. Symptoms include swelling in the throat, wheezing, a feeling of tightness in the chest, nausea or dizziness. Most serious systemic reactions develop within 30 minutes of allergy shots. This is why it is strongly recommended you wait in your doctor's office for 30 minutes after your injections. Your allergist is trained to watch for reactions, and his or her  staff is trained and equipped with the proper medications to identify and treat them.  Who Should Administer Allergy Shots?  The preferred location for receiving shots is your prescribing allergist's office. Injections can sometimes be given at another facility where the physician and staff are trained to recognize and treat reactions, and have received instructions by your prescribing allergist.

## 2019-11-22 NOTE — Progress Notes (Signed)
NEW PATIENT  Date of Service/Encounter:  11/22/19  Referring provider: Assunta Found, MD   Assessment:   Moderate persistent asthma, uncomplicated  Seasonal and perennial allergic rhinitis (grasses, weeds, trees, indoor molds, outdoor molds, dust mites, dog and cockroach)  Rheumatoid arthritis - on Humira, MTX, and folic acid  Passive smoke exposure - likely worsening her breathing status  Plan/Recommendations:   1. Moderate persistent asthma, uncomplicated - Lung testing actually looked fairly good. - We are going to add on Breo one puff once daily to help prevent the need for systemic steroids so often. - Cigarette smoke exposure is also making your breathing worse. - Cigarette smoke interrupts the function of cilia in the lungs, which help you to remove viruses and allergens from your lung.  - Spacer sample and demonstration provided. - Daily controller medication(s): Breo 100/34mcg one puff once daily - Prior to physical activity: albuterol 2 puffs 10-15 minutes before physical activity. - Rescue medications: albuterol 4 puffs every 4-6 hours as needed or albuterol nebulizer one vial every 4-6 hours as needed - Asthma control goals:  * Full participation in all desired activities (may need albuterol before activity) * Albuterol use two time or less a week on average (not counting use with activity) * Cough interfering with sleep two time or less a month * Oral steroids no more than once a year * No hospitalizations  2. Chronic rhinitis - Testing today showed: grasses, weeds, trees, indoor molds, outdoor molds, dust mites, dog and cockroach - Copy of test results provided.  - Avoidance measures provided. - Start taking: Zyrtec (cetirizine)  tablet once daily and Dymista (fluticasone/azelastine) two sprays per nostril 1-2 times daily - You can use an extra dose of the antihistamine, if needed, for breakthrough symptoms.  - Consider nasal saline rinses 1-2 times  daily to remove allergens from the nasal cavities as well as help with mucous clearance (this is especially helpful to do before the nasal sprays are given) - Consider allergy shots as a means of long-term control. - Allergy shots "re-train" and "reset" the immune system to ignore environmental allergens and decrease the resulting immune response to those allergens (sneezing, itchy watery eyes, runny nose, nasal congestion, etc).    - Allergy shots improve symptoms in 75-85% of patients.  - We can discuss more at the next appointment if the medications are not working for you.  3. Return in about 2 months (around 01/20/2020). This can be an in-person, a virtual Webex or a telephone follow up visit.  Subjective:   Laura Allen is a 69 y.o. female presenting today for evaluation of  Chief Complaint  Patient presents with  . Asthma    Allergies symptoms that lead to shortness of breath, cough and wheeze. Every 3-4 months.    Laura Allen has a history of the following: Patient Active Problem List   Diagnosis Date Noted  . Left knee DJD 11/25/2016  . Multinodular goiter 03/19/2016  . Primary hypothyroidism 09/11/2015  . Status post total right knee replacement 05/20/2015  . Rheumatic joint disease 12/03/2011  . Primary osteoarthritis of left knee 12/03/2011    History obtained from: chart review and patient. Her granddaughter comes here, which is how she heard about [place].   [name] was referred by Assunta Found, MD.     [name] is a 69 y.o. female presenting for an evaluation of asthma. She tends to have episodes of "allergic bronchitis" every two months. She has had  multiple rounds of prednisone and antibiotics for these. They start out as sinus infections and then she starts wheezing. The last time that this happened, Dr. Renette Butters felt that she was "full blow asthma". She is unsure of the triggers and her daughter demanded that she referred somewhere.   She has always  had some trouble in the spring and the fall. But overall this has been getting worse. At one point, it was felt that she had pneumonia. She will try to handle it at home. Now she had an inhaler and a nebulizer. She will eventually have problems with ADLs and walking in her home when she is particularly bad. She does get CXR and these have all been normal. She gets antibiotics very frequently with the prednisone. Therefore, she presents today for an evaluation of possible triggers.    She worked for the [employer]. 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 Since retirement she has had two knee replacements. She has a history of rheumatoid arthritis. She was diagnosed with this at age 26. She takes Humira, MTX, and folic acid.   Otherwise, there is no history of other atopic diseases, including drug allergies, stinging insect allergies, eczema or contact dermatitis. There is no significant infectious history. Vaccinations are up to date.    Past Medical History: Patient Active Problem List   Diagnosis Date Noted  . Left knee DJD 11/25/2016  . Multinodular goiter 03/19/2016  . Primary hypothyroidism 09/11/2015  . Status post total right knee replacement 05/20/2015  . Rheumatic joint disease 12/03/2011  . Primary osteoarthritis of left knee 12/03/2011    Medication List:  Allergies as of 11/22/2019      Reactions   Ibuprofen Swelling, Other (See Comments)   Can take in small doses.  Large doses cause swelling.    Levofloxacin Hives, Rash   Streptomycin Hives, Rash      Medication List       Accurate as of November 22, 2019  4:36 PM. If you have any questions, ask your nurse or doctor.        albuterol 108 (90 Base) MCG/ACT inhaler Commonly known as: VENTOLIN HFA Inhale 2 puffs into the lungs every 4 (four) hours as needed for wheezing or shortness of breath.   Azelastine-Fluticasone 137-50 MCG/ACT Susp Place 2  sprays into both nostrils 2 (two) times daily as needed. Started by: Alfonse Spruce, MD   Breo Ellipta 100-25 MCG/INH Aepb Generic drug: fluticasone furoate-vilanterol Inhale 1 puff into the lungs daily. Started by: Alfonse Spruce, MD   CALCIUM 600/VITAMIN D3 PO Take 1 tablet by mouth daily.   diltiazem 120 MG 12 hr capsule Commonly known as: CARDIZEM SR Take 120 mg by mouth daily.   ergocalciferol 1.25 MG (50000 UT) capsule Commonly known as: VITAMIN D2 Take 50,000 Units by mouth every Wednesday.   folic acid 1 MG tablet Commonly known as: FOLVITE Take 1 mg by mouth daily.   Humira 40 MG/0.8ML injection Generic drug: adalimumab Inject 40 mg into the skin every 14 (fourteen) days.   levothyroxine 100 MCG tablet Commonly known as: SYNTHROID Take 1 tablet (100 mcg total) by mouth daily before breakfast.   methotrexate 2.5 MG tablet Commonly known as: RHEUMATREX Take 10 mg by mouth 2 (two) times a week. Caution:Chemotherapy. Protect from light. On Thursdays and Fridays Has stopped prior to procedure   Potassium Gluconate 550 MG Tabs Take 550 mg  by mouth daily.   Xyzal Allergy 24HR 5 MG tablet Generic drug: levocetirizine Take 5 mg by mouth daily.       Birth History: non-contributory  Developmental History: non-contributory  Past Surgical History: Past Surgical History:  Procedure Laterality Date  . CATARACT EXTRACTION W/PHACO Left 09/04/2013   Procedure: CATARACT EXTRACTION PHACO AND INTRAOCULAR LENS PLACEMENT (IOC);  Surgeon: Tonny Branch, MD;  Location: [facility];  Service: Ophthalmology;  Laterality: Left;  CDE:  10.22  . CATARACT EXTRACTION W/PHACO Right 09/14/2013   Procedure: CATARACT EXTRACTION PHACO AND INTRAOCULAR LENS PLACEMENT (IOC);  Surgeon: Tonny Branch, MD;  Location: [facility];  Service: Ophthalmology;  Laterality: Right;  CDE:8.81  . CHOLECYSTECTOMY    . COLONOSCOPY    . COLONOSCOPY N/A 06/24/2018   Procedure: COLONOSCOPY;  Surgeon: Daneil Dolin, MD;  Location: [facility];  Service: Endoscopy;  Laterality: N/A;  9:45  . KNEE ARTHROPLASTY Right 05/20/2015   Procedure: COMPUTER ASSISTED TOTAL KNEE ARTHROPLASTY;  Surgeon: Marybelle Killings, MD;  Location: [place];  Service: Orthopedics;  Laterality: Right;  . OVARIAN CYST SURGERY    . TOTAL KNEE ARTHROPLASTY Left 11/25/2016   Procedure: LEFT TOTAL KNEE ARTHROPLASTY;  Surgeon: Marybelle Killings, MD;  Location: [place];  Service: Orthopedics;  Laterality: Left;     Family History: Family History  Problem Relation Age of Onset  . Arthritis Other   . Cancer Other   . Diabetes Other   . Allergic rhinitis Neg Hx   . Angioedema Neg Hx   . Asthma Neg Hx   . Atopy Neg Hx   . Eczema Neg Hx   . Immunodeficiency Neg Hx   . Urticaria Neg Hx      Social History: Shaquisha lives at home with her husband.  They live in a house.  There is hardwood in the main living areas and carpeting in the bedrooms.  She has electric heating and window units for cooling.  There is 1 dog inside of the home.  There is a horse outside of the home.  There are no dust mite covers on the bedding.  There is tobacco exposure, as her husband smokes.  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  Her husband works for the [employer].   Review of Systems  Constitutional: Negative.  Negative for chills, fever, malaise/fatigue and weight loss.  HENT: Positive for congestion and sinus pain. Negative for ear discharge, ear pain and sore throat.   Eyes: Negative for pain, discharge and redness.  Respiratory: Positive for cough and shortness of breath. Negative for sputum production and wheezing.   Cardiovascular: Negative.  Negative for chest pain and palpitations.  Gastrointestinal: Negative for abdominal pain, constipation, diarrhea, heartburn, nausea and vomiting.  Skin: Negative.  Negative for itching and rash.  Neurological: Negative for dizziness and  headaches.  Endo/Heme/Allergies: Negative for environmental allergies. Does not bruise/bleed easily.       Objective:   Blood pressure 132/84, pulse 90, temperature 97.6 F (36.4 C), temperature source Temporal, resp. rate 18, height 5' 6.5" (1.689 m), weight 221 lb 9.6 oz (100.5 kg), SpO2 99 %. Body mass index is 35.23 kg/m.   Physical Exam:   Physical Exam  Constitutional: She appears well-developed.  Pleasant female. Very friendly.   HENT:  Head: Normocephalic and atraumatic.  Right Ear: Tympanic membrane, external ear and ear canal normal.  Left Ear: Tympanic membrane, external ear and  ear canal normal.  Nose: Mucosal edema and rhinorrhea present. No nasal deformity or septal deviation. No epistaxis. Right sinus exhibits no maxillary sinus tenderness and no frontal sinus tenderness. Left sinus exhibits no maxillary sinus tenderness and no frontal sinus tenderness.  Mouth/Throat: Uvula is midline and oropharynx is clear and moist. Mucous membranes are not pale and not dry.  Tonsils unremarkable.  There is some cobblestoning present.  Eyes: Pupils are equal, round, and reactive to light. Conjunctivae and EOM are normal. Right eye exhibits no chemosis and no discharge. Left eye exhibits no chemosis and no discharge. Right conjunctiva is not injected. Left conjunctiva is not injected.  Cardiovascular: Normal rate, regular rhythm and normal heart sounds.  Respiratory: Effort normal and breath sounds normal. No accessory muscle usage. No tachypnea. No respiratory distress. She has no wheezes. She has no rhonchi. She has no rales. She exhibits no tenderness.  Moving air well in all lung fields. No increased work of breathing noted.   Lymphadenopathy:    She has no cervical adenopathy.  Neurological: She is alert.  Skin: No abrasion, no petechiae and no rash noted. Rash is not papular, not vesicular and not urticarial. No erythema. No pallor.  No eczematous or urticarial lesions noted.   Psychiatric: She has a normal mood and affect.     Diagnostic studies:   Spirometry: results normal (FEV1: 2.92/125%, FVC: 3.37/104%, FEV1/FVC: 86%).    Spirometry consistent with normal pattern.    Airborne Adult Perc - 11/22/19 1402    Allergen Manufacturer  Waynette Buttery    Location  Back    Number of Test  59    Panel 1  Select    1. Control-Buffer 50% Glycerol  Negative    2. Control-Histamine 1 mg/ml  2+    3. Albumin saline  Negative    4. Bahia  Negative    5. French Southern Territories  Negative    6. Johnson  Negative    7. Kentucky Blue  Negative    8. Meadow Fescue  Negative    9. Perennial Rye  Negative    10. Sweet Vernal  Negative    11. Timothy  Negative    12. Cocklebur  Negative    13. Burweed Marshelder  Negative    14. Ragweed, short  Negative    15. Ragweed, Giant  Negative    16. Plantain,  English  Negative    17. Lamb's Quarters  Negative    18. Sheep Sorrell  Negative    19. Rough Pigweed  Negative    20. Marsh Elder, Rough  Negative    21. Mugwort, Common  Negative    22. Ash mix  Negative    23. Birch mix  Negative    24. Beech American  Negative    25. Box, Elder  Negative    26. Cedar, red  Negative    27. Cottonwood, Guinea-Bissau  Negative    28. Elm mix  Negative    29. Hickory mix  Negative    30. Maple mix  Negative    31. Oak, Guinea-[place]issau mix  Negative    32. Pecan Pollen  Negative    33. Pine mix  Negative    34. Sycamore Eastern  Negative    35. Walnut, Black Pollen  Negative    36. Alternaria alternata  Negative    37. Cladosporium Herbarum  Negative    38. Aspergillus mix  Negative    39. Penicillium mix  Negative  40. Bipolaris sorokiniana (Helminthosporium)  Negative    41. Drechslera spicifera (Curvularia)  Negative    42. Mucor plumbeus  Negative    43. Fusarium moniliforme  Negative    44. Aureobasidium pullulans (pullulara)  Negative    45. Rhizopus oryzae  Negative    46. Botrytis cinera  Negative    47. Epicoccum nigrum  Negative    48.  Phoma betae  Negative    49. Candida Albicans  Negative    50. Trichophyton mentagrophytes  Negative    51. Mite, D Farinae  5,000 AU/ml  Negative    52. Mite, D Pteronyssinus  5,000 AU/ml  Negative    53. Cat Hair 10,000 BAU/ml  Negative    54.  Dog Epithelia  Negative    55. Mixed Feathers  Negative    56. Horse Epithelia  Negative    57. Cockroach, German  Negative    58. Mouse  Negative    59. Tobacco Leaf  Negative     Intradermal - 11/22/19 1501    Time Antigen Placed  1445    Allergen Manufacturer  Waynette Buttery    Location  Arm    Number of Test  15    Intradermal  Select    Control  Negative    French [place]  Negative    Johnson  1+    7 Grass  2+    Ragweed mix  Negative    Weed mix  1+    Tree mix  1+    Mold 1  Negative    Mold 2  2+    Mold 3  2+    Mold 4  2+    Cat  Negative    Dog  2+    Cockroach  1+    Mite mix  1+          Malachi Bonds, MD [facility]

## 2020-01-02 DIAGNOSIS — Z79899 Other long term (current) drug therapy: Secondary | ICD-10-CM | POA: Diagnosis not present

## 2020-01-02 DIAGNOSIS — M150 Primary generalized (osteo)arthritis: Secondary | ICD-10-CM | POA: Diagnosis not present

## 2020-01-02 DIAGNOSIS — M2550 Pain in unspecified joint: Secondary | ICD-10-CM | POA: Diagnosis not present

## 2020-01-02 DIAGNOSIS — M0579 Rheumatoid arthritis with rheumatoid factor of multiple sites without organ or systems involvement: Secondary | ICD-10-CM | POA: Diagnosis not present

## 2020-01-24 ENCOUNTER — Encounter: Payer: Self-pay | Admitting: Allergy & Immunology

## 2020-01-24 ENCOUNTER — Ambulatory Visit (INDEPENDENT_AMBULATORY_CARE_PROVIDER_SITE_OTHER): Payer: Medicare Other | Admitting: Allergy & Immunology

## 2020-01-24 ENCOUNTER — Other Ambulatory Visit: Payer: Self-pay

## 2020-01-24 VITALS — BP 144/64 | HR 102 | Temp 97.2°F | Resp 20

## 2020-01-24 DIAGNOSIS — J3089 Other allergic rhinitis: Secondary | ICD-10-CM

## 2020-01-24 DIAGNOSIS — J302 Other seasonal allergic rhinitis: Secondary | ICD-10-CM

## 2020-01-24 DIAGNOSIS — J4540 Moderate persistent asthma, uncomplicated: Secondary | ICD-10-CM

## 2020-01-24 NOTE — Progress Notes (Signed)
FOLLOW UP  Date of Service/Encounter:  01/24/20   Assessment:   Moderate persistent asthma, uncomplicated  Seasonal and perennial allergic rhinitis (grasses, weeds, trees, indoor molds, outdoor molds, dust mites, dog and cockroach)  Rheumatoid arthritis - on Humira, MTX, and folic acid  Passive smoke exposure - likely worsening her breathing status     Laura Allen is doing very well today.  The addition of the Memory Dance has helped her to avoid any prednisone.  She has not needed any steroids or antibiotics in quite some time.  Her allergic rhinitis is controlled very well with the Dymista generic.  While she still has some clear rhinorrhea today, she does not feel that it is hampering her quality of life at all.  She certainly feels better than she did on the Flonase alone.  We did discuss allergen immunotherapy, but she does not feel that she is to that point at this time.  Likewise, I do not think we need to look into the addition of Biologics for her asthma control since she is doing so well.  Plan/Recommendations:    Patient Instructions  1. Moderate persistent asthma, uncomplicated - Lung testing looks great today. - I think that the addition of the Breo helped a lot.  - We are not going to make any changes at this time.  - Daily controller medication(s): Breo 100/29mcg one puff once daily - Prior to physical activity: albuterol 2 puffs 10-15 minutes before physical activity. - Rescue medications: albuterol 4 puffs every 4-6 hours as needed or albuterol nebulizer one vial every 4-6 hours as needed - Asthma control goals:  * Full participation in all desired activities (may need albuterol before activity) * Albuterol use two time or less a week on average (not counting use with activity) * Cough interfering with sleep two time or less a month * Oral steroids no more than once a year * No hospitalizations  2. Chronic rhinitis (grasses, weeds, trees, indoor molds, outdoor  molds, dust mites, dog and cockroach) - You look very good today!  - Continue taking: Zyrtec (cetirizine) 10mg  tablet once daily and Dymista (fluticasone/azelastine) two sprays per nostril 1-2 times daily - You can use an extra dose of the antihistamine, if needed, for breakthrough symptoms.  - Consider nasal saline rinses 1-2 times daily to remove allergens from the nasal cavities as well as help with mucous clearance (this is especially helpful to do before the nasal sprays are given) - Consider allergy shots as a means of long-term control.  3. Return in about 6 months (around 07/26/2020). This can be an in-person, a virtual Webex or a telephone follow up visit.   Please inform us of any Emergency Department visits, hospitalizations, or changes in symptoms. Call us before going to the ED for breathing or allergy symptoms since we might be able to fit you in for a sick visit. Feel free to contact us anytime with any questions, problems, or concerns.  It was a pleasure to see you again today!  Websites that have reliable patient information: 1. American Academy of Asthma, Allergy, and Immunology: www.aaaai.org 2. Food Allergy Research and Education (FARE): foodallergy.org 3. Mothers of Asthmatics: http://www.asthmacommunitynetwork.org 4. American College of Allergy, Asthma, and Immunology: www.acaai.org   COVID-19 Vaccine Information can be found at: ShippingScam.co.uk For questions related to vaccine distribution or appointments, please email [email or web address] or call [phone].     "Like" [place] on Facebook and Instagram for our latest updates!  Make sure you are registered to vote! If you have moved or changed any of your contact information, you will need to get this updated before voting!  In some cases, you MAY be able to register to vote online:  [email or web address]         Subjective:   Laura Allen is a 69 y.o. female presenting today for follow up of  Chief Complaint  Patient presents with  . Follow-up    allergies and asthma    Laura Allen has a history of the following: Patient Active Problem List   Diagnosis Date Noted  . Left knee DJD 11/25/2016  . Multinodular goiter 03/19/2016  . Primary hypothyroidism 09/11/2015  . Status post total right knee replacement 05/20/2015  . Rheumatic joint disease 12/03/2011  . Primary osteoarthritis of left knee 12/03/2011    History obtained from: chart review and patient.  Laura Allen is a 69 y.o. female presenting for a follow up visit.  She was last seen as a new patient in January 2021.  At that time, her lung testing looked good but she was symptomatic so we added on Breo 100/25 mcg 1 puff once daily.  She does have albuterol to use as needed.  She had testing that was positive to grasses, weeds, trees, indoor and outdoor molds, dust mite, dog, and cockroach.  We started her on Zyrtec 10 mg daily and Dymista 2 sprays per nostril up to twice daily.  Since last visit, she has done well.  Asthma/Respiratory Symptom History: She remains on the Breo 1 puff once daily.  This has avoided the need for any prednisone at all since last visit.  Prior to my meeting her, she was needing prednisone and/or antibiotics every 2 months or so. ACT score is 24, indicating excellent asthma control.  She has not needed any prednisone, ER visits, or urgent care visits for her symptoms.  Her rescue inhaler use has gone down to nothing.  She thinks that the [facility] was affordable.  She does not remember the exact price, but it was certainly less than 100 which is good for her.  Allergic Rhinitis Symptom History: She remains on the Dymista up to twice daily at this point.  She did notice when the tree pollen increased, and she did increase her Dymista accordingly.  She  also remains on the antihistamine every day.  She does not think that allergen immunotherapy is needed at this time.  She has not needed any antibiotics for sinusitis.  Her rheumatoid arthritis is flaring with the change in the weather, but overall she is doing very well.  She worked for the [employer]. 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 Since retirement she has had two knee replacements. She has a history of rheumatoid arthritis. She was diagnosed with this at age 39. She takes Humira, MTX, and folic acid.   Otherwise, there have been no changes to her past medical history, surgical history, family history, or social history.    Review of Systems  Constitutional: Negative.  Negative for chills, fever, malaise/fatigue and weight loss.  HENT: Negative.  Negative for congestion, ear discharge and ear pain.   Eyes: Negative for pain, discharge and redness.  Respiratory: Negative for cough, sputum production, shortness of breath and wheezing.   Cardiovascular: Negative.  Negative for chest pain and palpitations.  Gastrointestinal: Negative for abdominal pain, constipation, diarrhea, heartburn, nausea and vomiting.  Skin: Negative.  Negative for itching and rash.  Neurological: Negative for dizziness and headaches.  Endo/Heme/Allergies: Negative for environmental allergies. Does not bruise/bleed easily.       Objective:   Blood pressure (!) 144/64, pulse (!) 102, temperature (!) 97.2 F (36.2 C), temperature source Temporal, resp. rate 20, SpO2 97 %. There is no height or weight on file to calculate BMI.   Physical Exam:  Physical Exam  Constitutional: She appears well-developed.  Very pleasant female.  Talkative.  HENT:  Head: Normocephalic and atraumatic.  Right Ear: Tympanic membrane, external ear and ear canal normal.  Left Ear: Tympanic membrane, external ear and ear canal normal.  Nose: Mucosal  edema and rhinorrhea present. No nasal deformity or septal deviation. No epistaxis. Right sinus exhibits no maxillary sinus tenderness and no frontal sinus tenderness. Left sinus exhibits no maxillary sinus tenderness and no frontal sinus tenderness.  Mouth/Throat: Uvula is midline and oropharynx is clear and moist. Mucous membranes are not pale and not dry.  Cobblestoning much less apparent today.  Tonsils unremarkable bilaterally.  No sinus tenderness.  Eyes: Pupils are equal, round, and reactive to light. Conjunctivae and EOM are normal. Right eye exhibits no chemosis and no discharge. Left eye exhibits no chemosis and no discharge. Right conjunctiva is not injected. Left conjunctiva is not injected.  Cardiovascular: Normal rate, regular rhythm and normal heart sounds.  Respiratory: Effort normal and breath sounds normal. No accessory muscle usage. No tachypnea. No respiratory distress. She has no wheezes. She has no rhonchi. She has no rales. She exhibits no tenderness.  Moving air well in all lung fields.  No increased work of breathing.  Lymphadenopathy:    She has no cervical adenopathy.  Neurological: She is alert.  Skin: No abrasion, no petechiae and no rash noted. Rash is not papular, not vesicular and not urticarial. No erythema. No pallor.  Psychiatric: She has a normal mood and affect.     Diagnostic studies:    Spirometry: results normal (FEV1: 2.61/99%, FVC: 3.49/101%, FEV1/FVC: 75%).    Spirometry consistent with normal pattern.   Allergy Studies: none        Malachi Bonds, MD  [facility]

## 2020-01-24 NOTE — Patient Instructions (Addendum)
1. Moderate persistent asthma, uncomplicated - Lung testing looks great today. - I think that the addition of the Breo helped a lot.  - We are not going to make any changes at this time.  - Daily controller medication(s): Breo 100/75mcg one puff once daily - Prior to physical activity: albuterol 2 puffs 10-15 minutes before physical activity. - Rescue medications: albuterol 4 puffs every 4-6 hours as needed or albuterol nebulizer one vial every 4-6 hours as needed - Asthma control goals:  * Full participation in all desired activities (may need albuterol before activity) * Albuterol use two time or less a week on average (not counting use with activity) * Cough interfering with sleep two time or less a month * Oral steroids no more than once a year * No hospitalizations  2. Chronic rhinitis (grasses, weeds, trees, indoor molds, outdoor molds, dust mites, dog and cockroach) - You look very good today!  - Continue taking: Zyrtec (cetirizine) 10mg  tablet once daily and Dymista (fluticasone/azelastine) two sprays per nostril 1-2 times daily - You can use an extra dose of the antihistamine, if needed, for breakthrough symptoms.  - Consider nasal saline rinses 1-2 times daily to remove allergens from the nasal cavities as well as help with mucous clearance (this is especially helpful to do before the nasal sprays are given) - Consider allergy shots as a means of long-term control.  3. Return in about 6 months (around 07/26/2020). This can be an in-person, a virtual Webex or a telephone follow up visit.   Please inform 07/28/2020 of any Emergency Department visits, hospitalizations, or changes in symptoms. Call us before going to the ED for breathing or allergy symptoms since we might be able to fit you in for a sick visit. Feel free to contact us anytime with any questions, problems, or concerns.  It was a pleasure to see you again today!  Websites that have reliable patient information: 1. American  Academy of Asthma, Allergy, and Immunology: www.aaaai.org 2. Food Allergy Research and Education (FARE): foodallergy.org 3. Mothers of Asthmatics: http://www.asthmacommunitynetwork.org 4. American College of Allergy, Asthma, and Immunology: www.acaai.org   COVID-19 Vaccine Information can be found at: [place] For questions related to vaccine distribution or appointments, please email [email or web address] or call [phone].     "Like" [phone] on Facebook and Instagram for our latest updates!        Make sure you are registered to vote! If you have moved or changed any of your contact information, you will need to get this updated before voting!  In some cases, you MAY be able to register to vote online: [place]

## 2020-01-30 IMAGING — US US THYROID
1 series · 13 of 25 positions shown · non-contrast
Comparison: 11/10/2018, 10/26/2018

CLINICAL DATA: Thyroid nodules, previous biopsy left superior TR 5
nodule

EXAM:
THYROID ULTRASOUND
TECHNIQUE: Ultrasound examination of the thyroid gland and adjacent soft
tissues was performed.

[Series 1: us thyroid · 0.06mm/px · 13 of 57 slices shown]
[im 1/57]
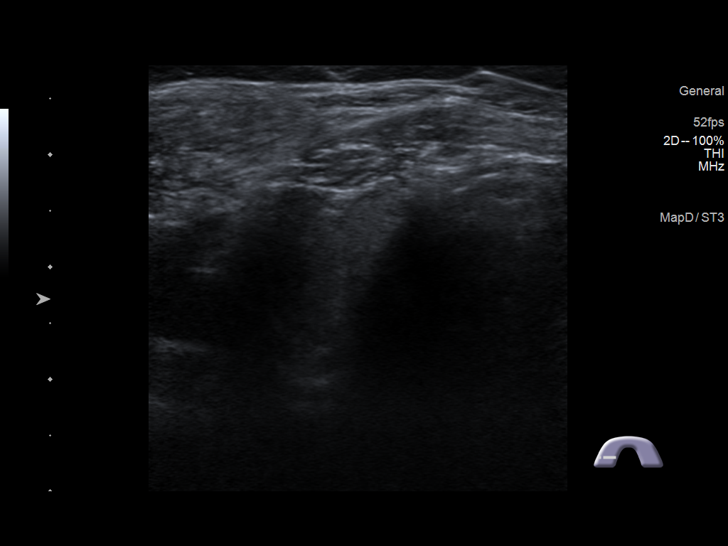
[im 5/57]
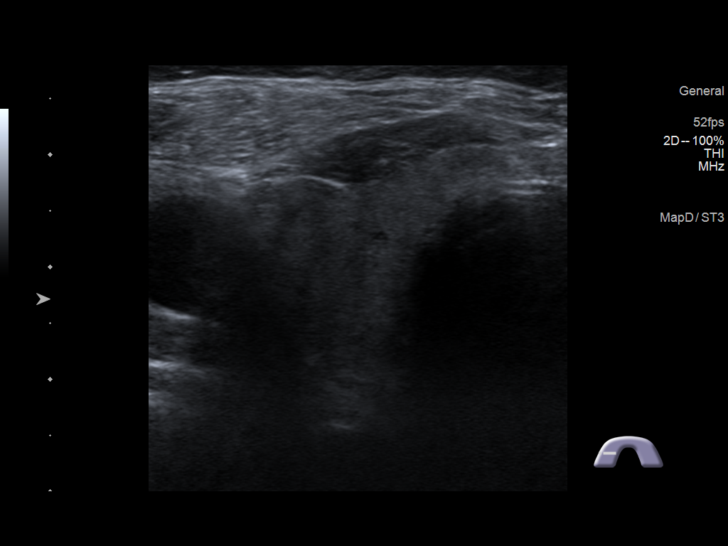
[im 10/57]
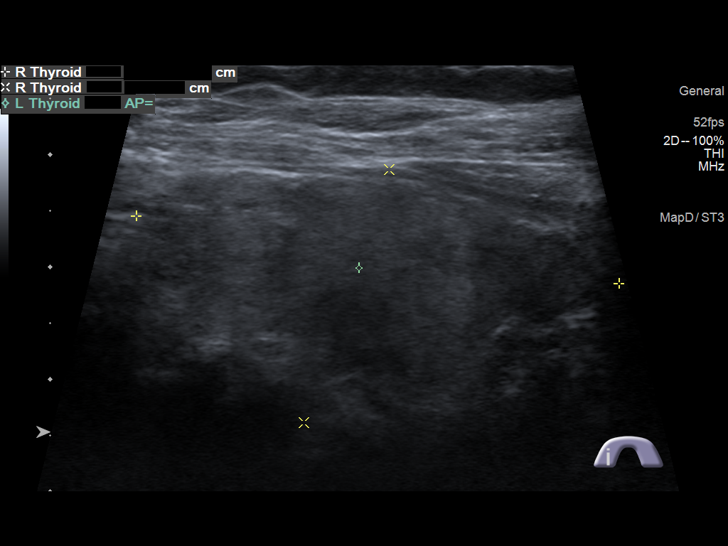
[im 15/57]
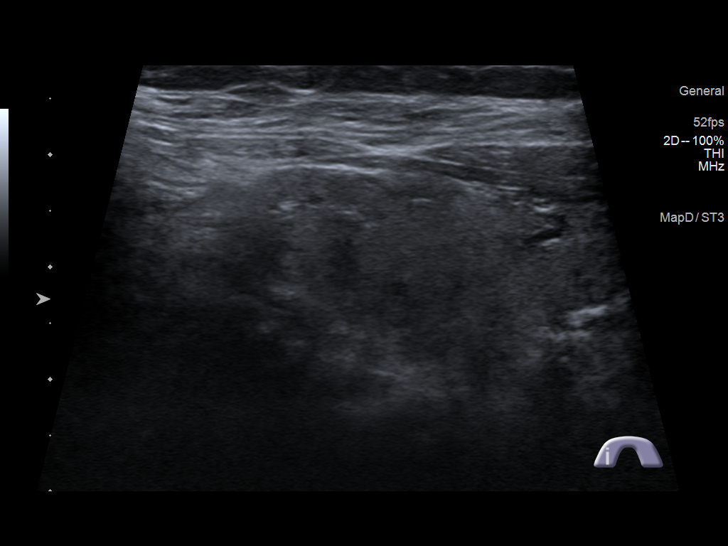
[im 19/57]
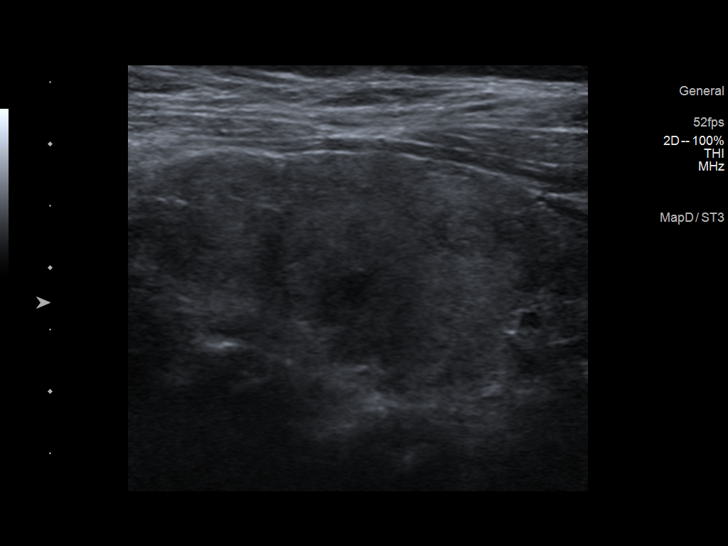
[im 24/57]
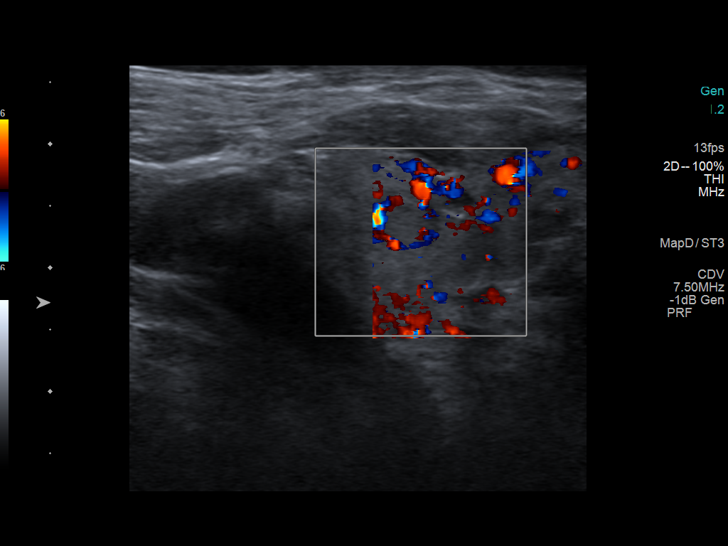
[im 29/57]
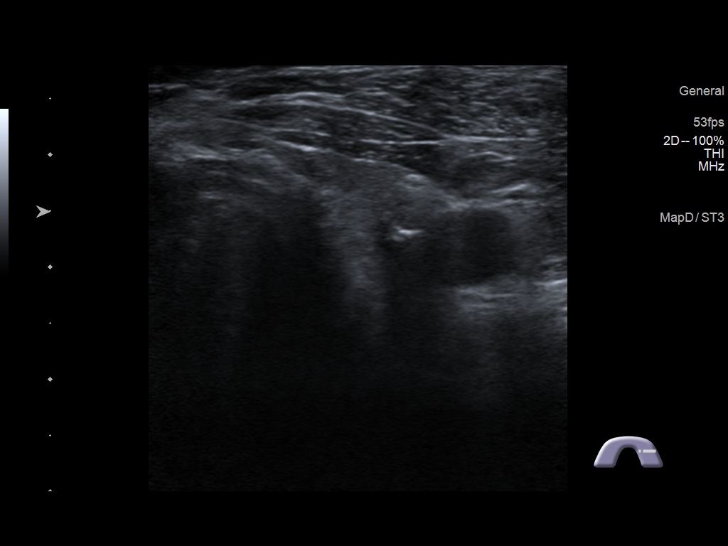
[im 33/57]
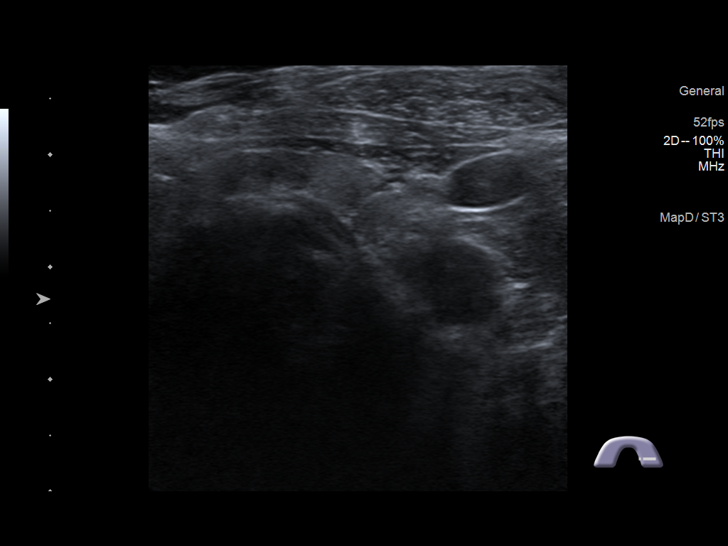
[im 38/57]
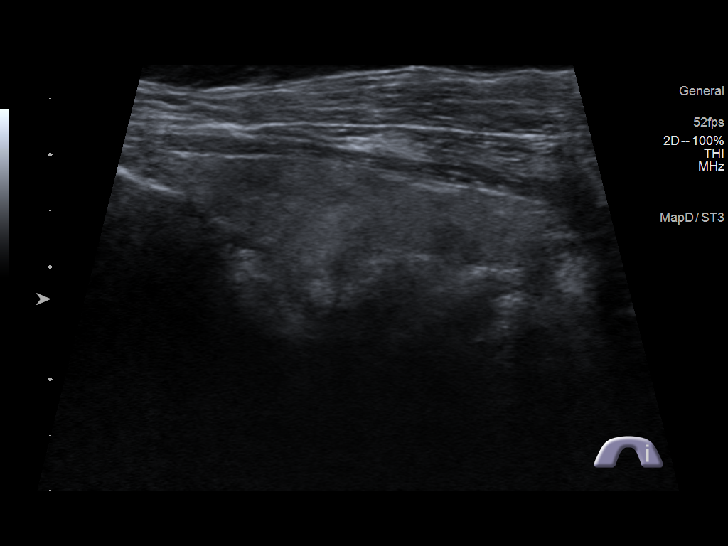
[im 43/57]
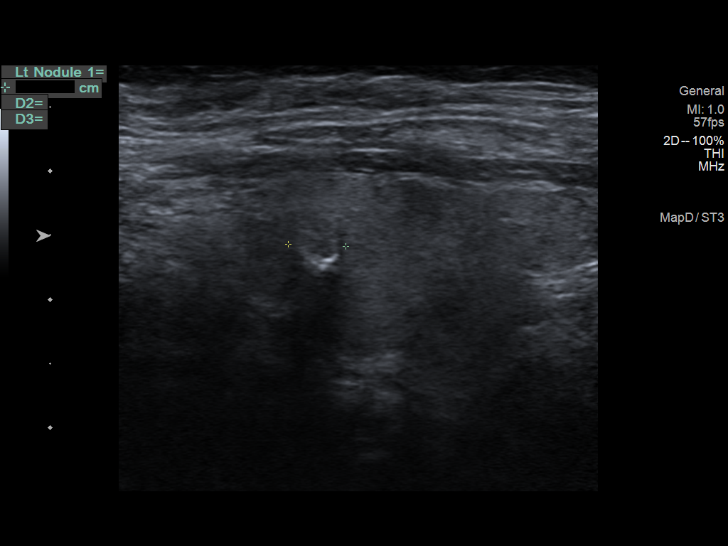
[im 47/57]
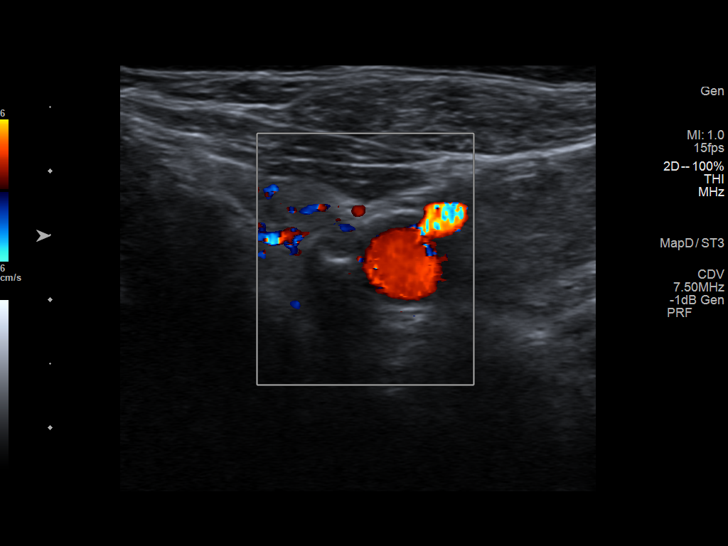
[im 52/57]
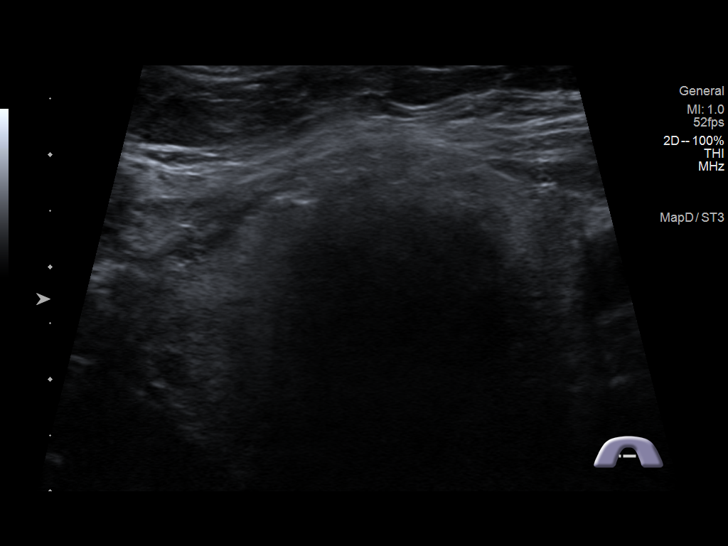
[im 57/57]
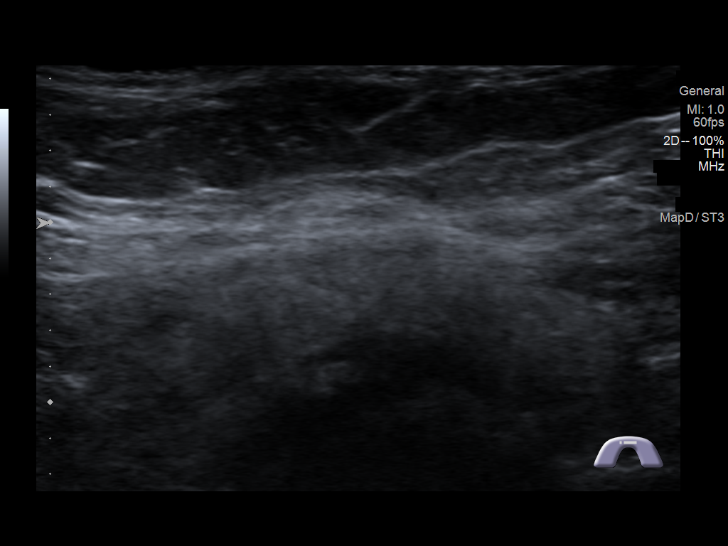

[13 of 25 positions shown; findings below may reference images not displayed]

FINDINGS: Parenchymal Echotexture: Moderately heterogenous

Isthmus: 6 mm

Right lobe: 4.3 x 2.4 x 1.6 cm

Left lobe: 3.8 x 1.8 x 0.9 cm

_________________________________________________________

Estimated total number of nodules >/= 1 cm: 0

Number of spongiform nodules >/=  2 cm not described below (TR1): 0

Number of mixed cystic and solid nodules >/= 1.5 cm not described
below (TR2): 0

_________________________________________________________

Stable moderate thyroid heterogeneity. Subcentimeter isoechoic right
inferior thyroid nodule measures only 7 mm. This would not meet
criteria for any biopsy or follow-up.

The previously biopsied left superior thyroid TR 5 nodule with
peripheral calcifications measures smaller, 6 mm, previously 10 mm.

No new thyroid abnormality or hypervascularity. No regional
adenopathy.
IMPRESSION: Stable to slightly smaller thyroid nodules.  No new finding.

Previously biopsied left superior thyroid TR 5 nodule measures
slightly smaller as above. Correlate with prior pathology.

The above is in keeping with the ACR TI-RADS recommendations - [HOSPITAL] 8587;[DATE].

## 2020-02-14 ENCOUNTER — Other Ambulatory Visit: Payer: Self-pay | Admitting: Family Medicine

## 2020-02-14 DIAGNOSIS — Z1231 Encounter for screening mammogram for malignant neoplasm of breast: Secondary | ICD-10-CM

## 2020-03-02 IMAGING — DX DG CHEST 2V
2 series · 2 of 2 positions shown · non-contrast
Comparison: 01/21/2018

CLINICAL DATA: Cough and shortness of breath for approximately 2
weeks.

EXAM:
CHEST - 2 VIEW

[chest pa]
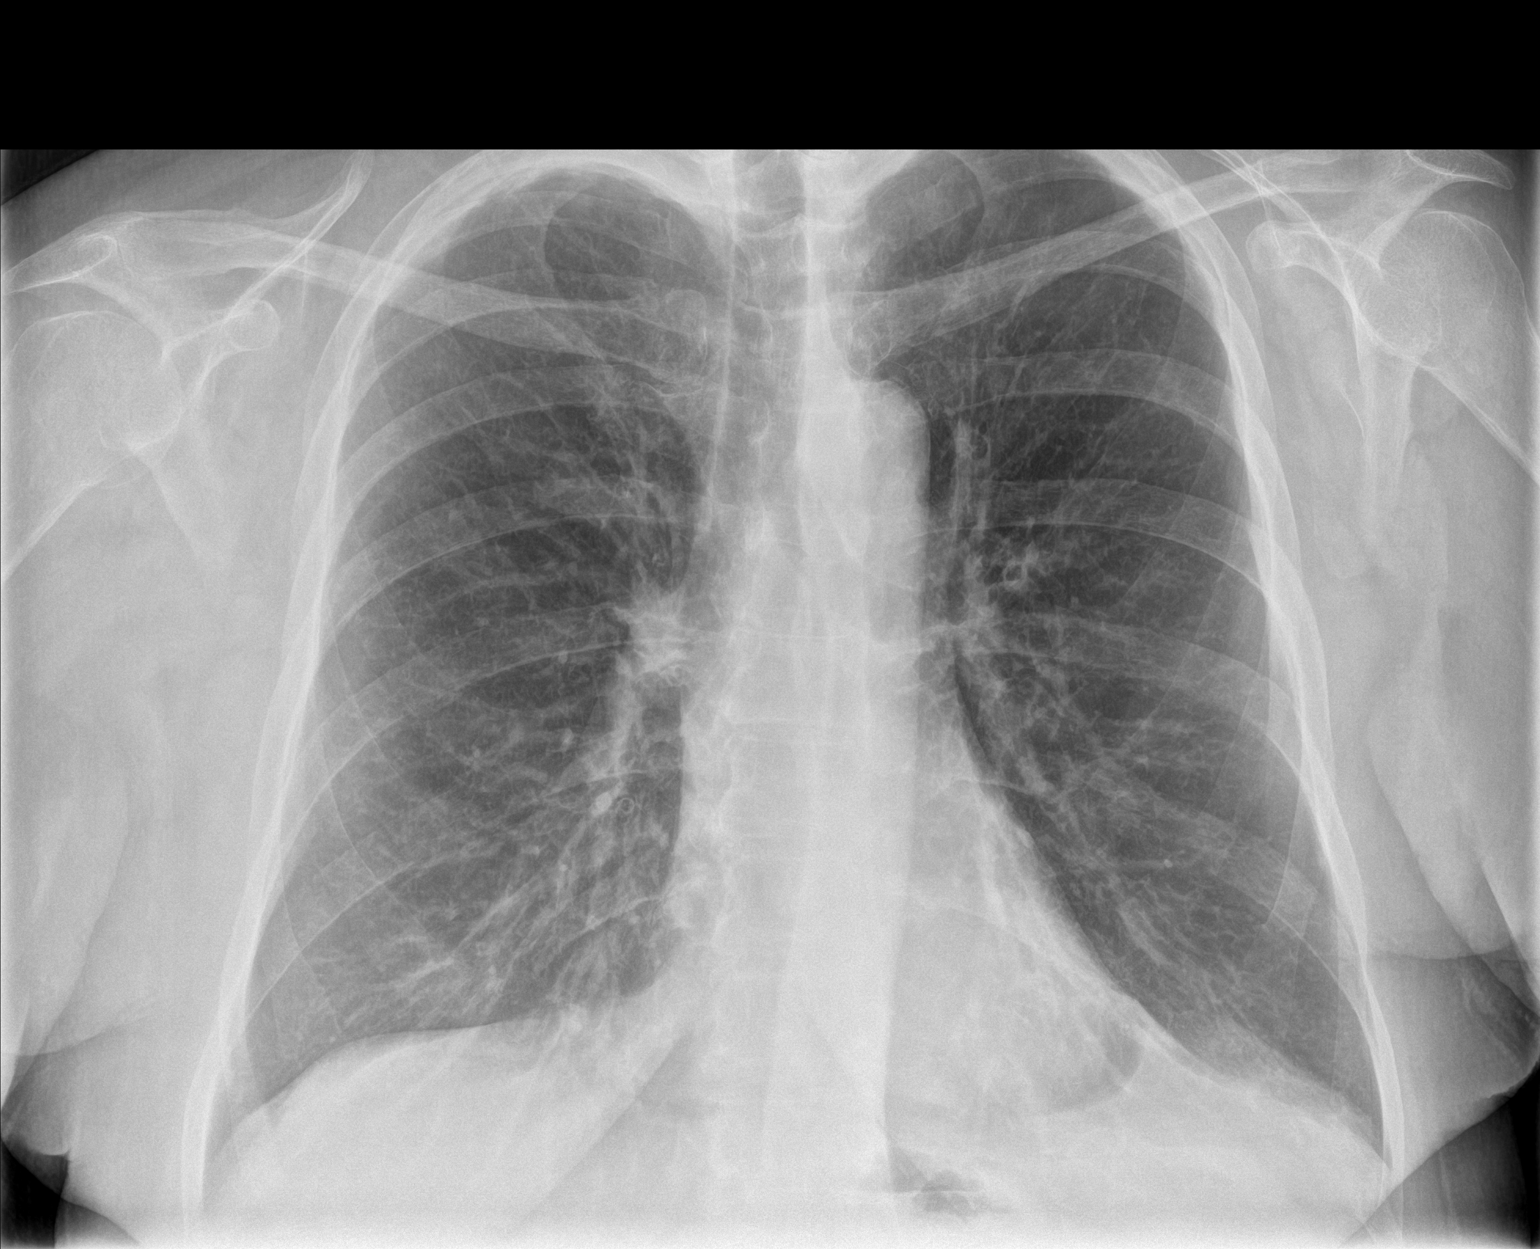

[chest lat]
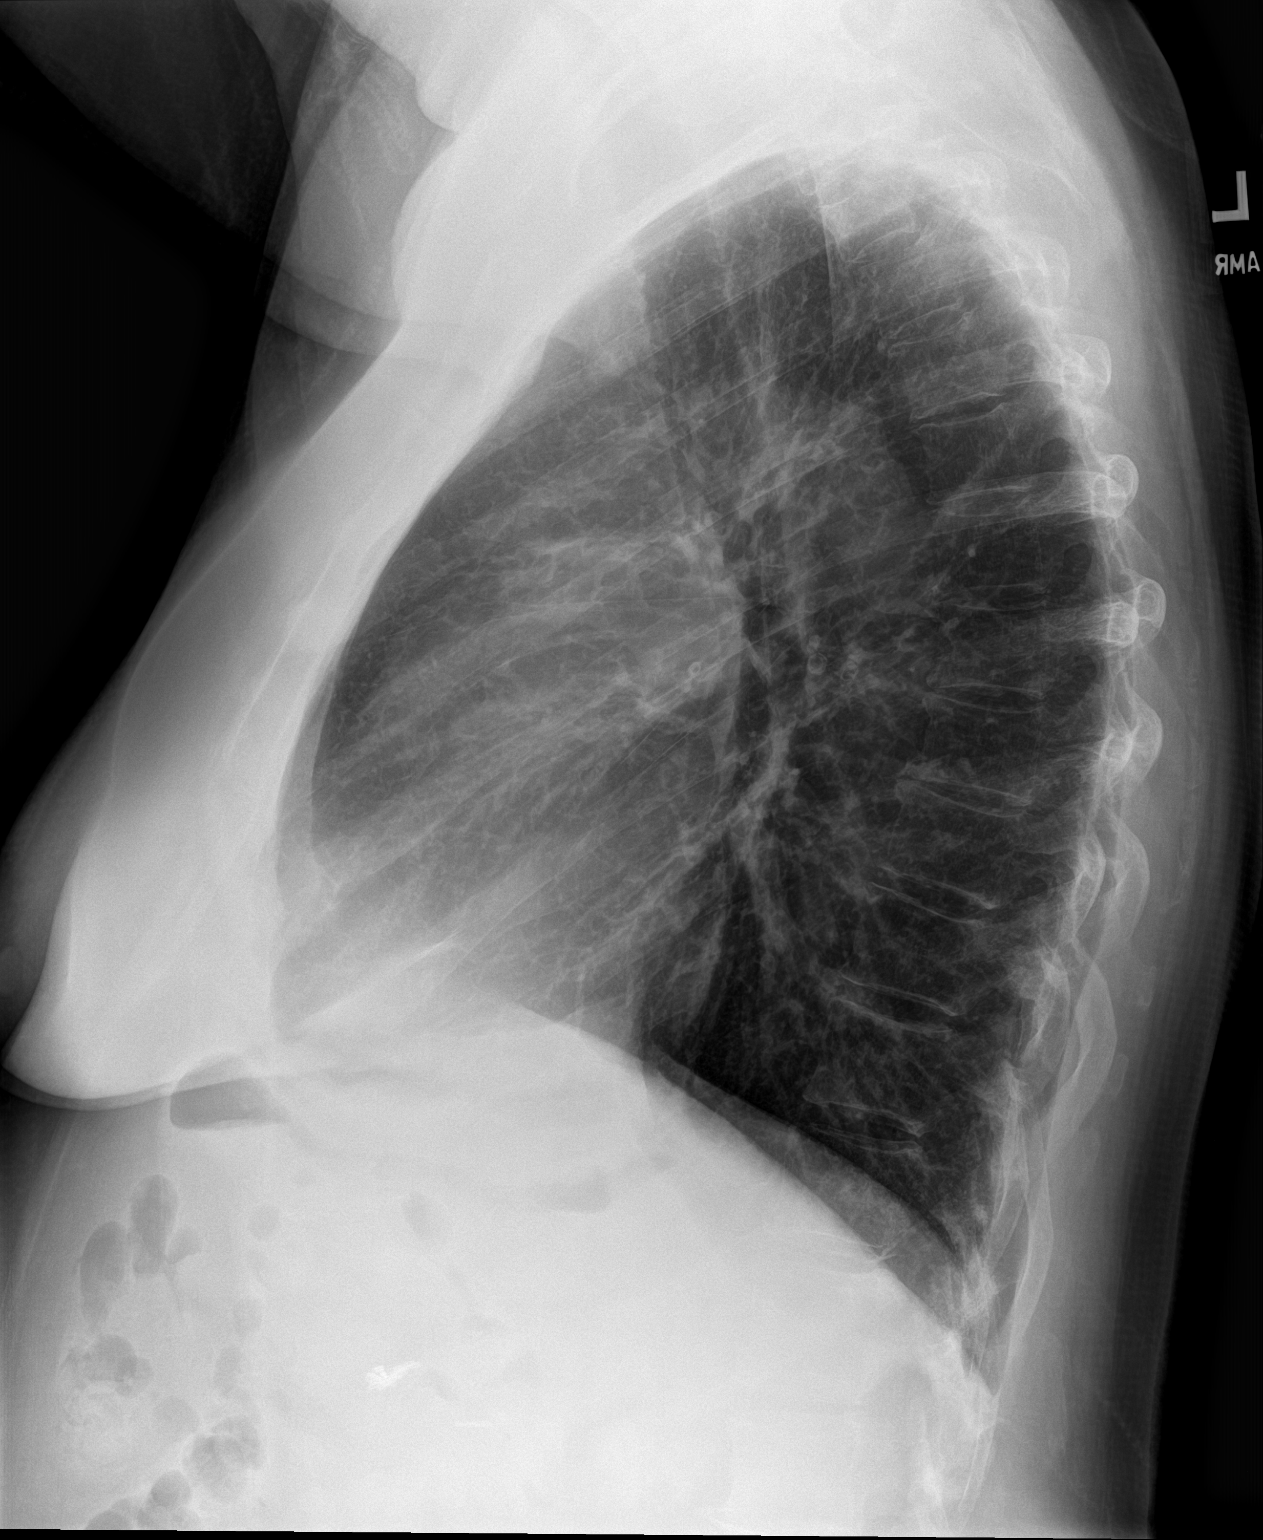

[2 of 2 positions shown; findings below may reference images not displayed]

FINDINGS: The heart size and mediastinal contours are within normal limits.
Stable chronic central peribronchial thickening. No evidence of
pulmonary infiltrate or edema. No evidence of pleural effusion. The
visualized skeletal structures are unremarkable.
IMPRESSION: Stable exam.  No active cardiopulmonary disease.

## 2020-04-02 DIAGNOSIS — M810 Age-related osteoporosis without current pathological fracture: Secondary | ICD-10-CM | POA: Diagnosis not present

## 2020-04-02 DIAGNOSIS — M0579 Rheumatoid arthritis with rheumatoid factor of multiple sites without organ or systems involvement: Secondary | ICD-10-CM | POA: Diagnosis not present

## 2020-04-02 DIAGNOSIS — M2550 Pain in unspecified joint: Secondary | ICD-10-CM | POA: Diagnosis not present

## 2020-04-02 DIAGNOSIS — M150 Primary generalized (osteo)arthritis: Secondary | ICD-10-CM | POA: Diagnosis not present

## 2020-05-06 DIAGNOSIS — M810 Age-related osteoporosis without current pathological fracture: Secondary | ICD-10-CM | POA: Diagnosis not present

## 2020-06-05 ENCOUNTER — Ambulatory Visit (HOSPITAL_COMMUNITY)
Admission: RE | Admit: 2020-06-05 | Discharge: 2020-06-05 | Disposition: A | Discharge status: Home / Self Care | Payer: Medicare Other | Source: Ambulatory Visit | Attending: Family Medicine | Admitting: Family Medicine

## 2020-06-05 ENCOUNTER — Other Ambulatory Visit: Payer: Self-pay

## 2020-06-05 DIAGNOSIS — Z1231 Encounter for screening mammogram for malignant neoplasm of breast: Secondary | ICD-10-CM | POA: Diagnosis not present

## 2020-07-03 DIAGNOSIS — M150 Primary generalized (osteo)arthritis: Secondary | ICD-10-CM | POA: Diagnosis not present

## 2020-07-03 DIAGNOSIS — M2550 Pain in unspecified joint: Secondary | ICD-10-CM | POA: Diagnosis not present

## 2020-07-03 DIAGNOSIS — M810 Age-related osteoporosis without current pathological fracture: Secondary | ICD-10-CM | POA: Diagnosis not present

## 2020-07-03 DIAGNOSIS — M0579 Rheumatoid arthritis with rheumatoid factor of multiple sites without organ or systems involvement: Secondary | ICD-10-CM | POA: Diagnosis not present

## 2020-07-26 ENCOUNTER — Ambulatory Visit (INDEPENDENT_AMBULATORY_CARE_PROVIDER_SITE_OTHER): Payer: Medicare Other | Admitting: Allergy & Immunology

## 2020-07-26 ENCOUNTER — Encounter: Payer: Self-pay | Admitting: Allergy & Immunology

## 2020-07-26 ENCOUNTER — Other Ambulatory Visit: Payer: Self-pay

## 2020-07-26 VITALS — BP 136/82 | HR 102 | Resp 18 | Ht 66.5 in

## 2020-07-26 DIAGNOSIS — J302 Other seasonal allergic rhinitis: Secondary | ICD-10-CM | POA: Diagnosis not present

## 2020-07-26 DIAGNOSIS — J3089 Other allergic rhinitis: Secondary | ICD-10-CM | POA: Diagnosis not present

## 2020-07-26 DIAGNOSIS — J4540 Moderate persistent asthma, uncomplicated: Secondary | ICD-10-CM | POA: Diagnosis not present

## 2020-07-26 MED ORDER — AZELASTINE-FLUTICASONE 137-50 MCG/ACT NA SUSP: 2.0000 | Freq: Two times a day (BID) | NASAL | 5 refills | Status: DC | PRN

## 2020-07-26 MED ORDER — ALBUTEROL SULFATE HFA 108 (90 BASE) MCG/ACT IN AERS: 2.0000 | INHALATION_SPRAY | RESPIRATORY_TRACT | 1 refills | Status: DC | PRN

## 2020-07-26 MED ORDER — BREO ELLIPTA 100-25 MCG/INH IN AEPB: 1.0000 | INHALATION_SPRAY | Freq: Every day | RESPIRATORY_TRACT | 3 refills | Status: DC

## 2020-07-26 NOTE — Patient Instructions (Addendum)
1. Moderate persistent asthma, uncomplicated - Lung testing deferred today.  - You seem to have a good handle on your symptoms. - We are not going to make any changes at this time.  - Daily controller medication(s): Breo 100/32mcg one puff once daily - Prior to physical activity: albuterol 2 puffs 10-15 minutes before physical activity. - Rescue medications: albuterol 4 puffs every 4-6 hours as needed or albuterol nebulizer one vial every 4-6 hours as needed - Asthma control goals:  * Full participation in all desired activities (may need albuterol before activity) * Albuterol use two time or less a week on average (not counting use with activity) * Cough interfering with sleep two time or less a month * Oral steroids no more than once a year * No hospitalizations  2. Chronic rhinitis (grasses, weeds, trees, indoor molds, outdoor molds, dust mites, dog and cockroach) - I think you are doing very well.  - You can take an extra dose of the Zyrtec if you need more coverage.  - Let's move the Zyrtec at night to see if there is any improvement.  - Continue taking: Zyrtec (cetirizine) 10mg  tablet once 1-2 times daily and Dymista (fluticasone/azelastine) two sprays per nostril 1-2 times daily - Consider nasal saline rinses 1-2 times daily to remove allergens from the nasal cavities as well as help with mucous clearance (this is especially helpful to do before the nasal sprays are given) - Consider allergy shots as a means of long-term control.  3. Return in about 6 months (around 01/23/2021).    Please inform 01/25/2021 of any Emergency Department visits, hospitalizations, or changes in symptoms. Call us before going to the ED for breathing or allergy symptoms since we might be able to fit you in for a sick visit. Feel free to contact us anytime with any questions, problems, or concerns.  It was a pleasure to see you again today!  Websites that have reliable patient information: 1. American Academy of  Asthma, Allergy, and Immunology: www.aaaai.org 2. Food Allergy Research and Education (FARE): foodallergy.org 3. Mothers of Asthmatics: http://www.asthmacommunitynetwork.org 4. American College of Allergy, Asthma, and Immunology: www.acaai.org   COVID-19 Vaccine Information can be found at: [place] For questions related to vaccine distribution or appointments, please email [email or web address] or call [phone].     Like [phone] on [place] and Instagram for our latest updates!      Make sure you are registered to vote! If you have moved or changed any of your contact information, you will need to get this updated before voting!  In some cases, you MAY be able to register to vote online: [employer]

## 2020-07-26 NOTE — Progress Notes (Signed)
FOLLOW UP  Date of Service/Encounter:  07/26/20   Assessment:   Moderate persistent asthma, uncomplicated  Seasonal and perennial allergic rhinitis(grasses, weeds, trees, indoor molds, outdoor molds, dust mites, dog and cockroach)  Rheumatoid arthritis - on Humira, MTX, and folic acid  Passive smoke exposure- likely worsening her breathing status   Plan/Recommendations:   1. Moderate persistent asthma, uncomplicated - Lung testing deferred today.  - You seem to have a good handle on your symptoms. - We are not going to make any changes at this time.  - Daily controller medication(s): Breo 100/13mcg one puff once daily - Prior to physical activity: albuterol 2 puffs 10-15 minutes before physical activity. - Rescue medications: albuterol 4 puffs every 4-6 hours as needed or albuterol nebulizer one vial every 4-6 hours as needed - Asthma control goals:  * Full participation in all desired activities (may need albuterol before activity) * Albuterol use two time or less a week on average (not counting use with activity) * Cough interfering with sleep two time or less a month * Oral steroids no more than once a year * No hospitalizations  2. Chronic rhinitis (grasses, weeds, trees, indoor molds, outdoor molds, dust mites, dog and cockroach) - I think you are doing very well.  - You can take an extra dose of the Zyrtec if you need more coverage.  - Let's move the Zyrtec at night to see if there is any improvement.  - Continue taking: Zyrtec (cetirizine) 10mg  tablet once 1-2 times daily and Dymista (fluticasone/azelastine) two sprays per nostril 1-2 times daily - Consider nasal saline rinses 1-2 times daily to remove allergens from the nasal cavities as well as help with mucous clearance (this is especially helpful to do before the nasal sprays are given) - Consider allergy shots as a means of long-term control.  3. Return in about 6 months (around 01/23/2021).     Subjective:   Laura Allen is a 69 y.o. female presenting today for follow up of  Chief Complaint  Patient presents with  . Asthma    Laura Allen has a history of the following: Patient Active Problem List   Diagnosis Date Noted  . Left knee DJD 11/25/2016  . Multinodular goiter 03/19/2016  . Primary hypothyroidism 09/11/2015  . Status post total right knee replacement 05/20/2015  . Rheumatic joint disease 12/03/2011  . Primary osteoarthritis of left knee 12/03/2011    History obtained from: chart review and patient.  Laura Allen is a 69 y.o. female presenting for a follow up visit.  She was last seen in March 2021.  At that time, her lung testing looked great.  We continue with Breo 100/25 mcg 1 puff once daily with albuterol as needed.  For her rhinitis, we continue with Zyrtec as well as Dymista.  Since last visit, she has done well.  Asthma/Respiratory Symptom History: She remains on the Breo one puff once daily. Landrey's asthma has been well controlled. She has not required rescue medication, experienced nocturnal awakenings due to lower respiratory symptoms, nor have activities of daily living been limited. She has required no Emergency Department or Urgent Care visits for her asthma. She has required zero courses of systemic steroids for asthma exacerbations since the last visit. ACT score today is 20, indicating excellent asthma symptom control.   Allergic Rhinitis Symptom History: She remains on the Zyrtec in the morning and Dymista once spray per nostril twice daily. She reports that she has had worsening congestion especially  in the morning. She has not tried taking an extra Zyrtec, as she did not know that this was an option. She is open to doing this and see how she does. She has not needed antibiotics at all.   Otherwise, there have been no changes to her past medical history, surgical history, family history, or social history.    Review of Systems   Constitutional: Negative.  Negative for chills, fever, malaise/fatigue and weight loss.  HENT: Positive for congestion and sinus pain. Negative for ear discharge and ear pain.   Eyes: Negative for pain, discharge and redness.  Respiratory: Negative for cough, sputum production, shortness of breath and wheezing.   Cardiovascular: Negative.  Negative for chest pain and palpitations.  Gastrointestinal: Negative for abdominal pain, constipation, diarrhea, heartburn, nausea and vomiting.  Skin: Negative.  Negative for itching and rash.  Neurological: Negative for dizziness and headaches.  Endo/Heme/Allergies: Positive for environmental allergies. Does not bruise/bleed easily.       Objective:   Blood pressure 136/82, pulse (!) 102, resp. rate 18, height 5' 6.5" (1.689 m), SpO2 98 %. Body mass index is 35.23 kg/m.   Physical Exam:  Physical Exam Constitutional:      Appearance: She is well-developed.     Comments: Bubbly female. Cooperative with the exam.   HENT:     Head: Normocephalic and atraumatic.     Right Ear: Tympanic membrane, ear canal and external ear normal.     Left Ear: Tympanic membrane, ear canal and external ear normal.     Nose: No nasal deformity, septal deviation, mucosal edema or rhinorrhea.     Right Turbinates: Enlarged and swollen.     Left Turbinates: Enlarged and swollen.     Right Sinus: No maxillary sinus tenderness or frontal sinus tenderness.     Left Sinus: No maxillary sinus tenderness or frontal sinus tenderness.     Mouth/Throat:     Mouth: Mucous membranes are not pale and not dry.     Pharynx: Uvula midline.     Comments: Tonsils unremarkable.  Eyes:     General:        Right eye: No discharge.        Left eye: No discharge.     Conjunctiva/sclera: Conjunctivae normal.     Right eye: Right conjunctiva is not injected. No chemosis.    Left eye: Left conjunctiva is not injected. No chemosis.    Pupils: Pupils are equal, round, and reactive  to light.  Cardiovascular:     Rate and Rhythm: Normal rate and regular rhythm.     Heart sounds: Normal heart sounds.  Pulmonary:     Effort: Pulmonary effort is normal. No tachypnea, accessory muscle usage or respiratory distress.     Breath sounds: Normal breath sounds. No wheezing, rhonchi or rales.     Comments: Moving air well in all lung fields. No increased work of breathing noted.  Chest:     Chest wall: No tenderness.  Lymphadenopathy:     Cervical: No cervical adenopathy.  Skin:    Coloration: Skin is not pale.     Findings: No abrasion, erythema, petechiae or rash. Rash is not papular, urticarial or vesicular.     Comments: No eczematous or urticarial lesions noted.   Neurological:     Mental Status: She is alert.  Psychiatric:        Behavior: Behavior is cooperative.      Diagnostic studies: none      Francis Dowse  Ernst Bowler, MD  [facility]

## 2020-07-29 ENCOUNTER — Encounter: Payer: Self-pay | Admitting: Allergy & Immunology

## 2020-08-16 DIAGNOSIS — J309 Allergic rhinitis, unspecified: Secondary | ICD-10-CM | POA: Diagnosis not present

## 2020-08-16 DIAGNOSIS — Z23 Encounter for immunization: Secondary | ICD-10-CM | POA: Diagnosis not present

## 2020-08-16 DIAGNOSIS — R7309 Other abnormal glucose: Secondary | ICD-10-CM | POA: Diagnosis not present

## 2020-08-16 DIAGNOSIS — J45901 Unspecified asthma with (acute) exacerbation: Secondary | ICD-10-CM | POA: Diagnosis not present

## 2020-08-16 DIAGNOSIS — M0689 Other specified rheumatoid arthritis, multiple sites: Secondary | ICD-10-CM | POA: Diagnosis not present

## 2020-08-16 DIAGNOSIS — Z1389 Encounter for screening for other disorder: Secondary | ICD-10-CM | POA: Diagnosis not present

## 2020-08-16 DIAGNOSIS — Z0001 Encounter for general adult medical examination with abnormal findings: Secondary | ICD-10-CM | POA: Diagnosis not present

## 2020-09-23 ENCOUNTER — Other Ambulatory Visit: Payer: Self-pay | Admitting: Allergy & Immunology

## 2020-09-24 DIAGNOSIS — E039 Hypothyroidism, unspecified: Secondary | ICD-10-CM | POA: Diagnosis not present

## 2020-09-25 DIAGNOSIS — M810 Age-related osteoporosis without current pathological fracture: Secondary | ICD-10-CM | POA: Diagnosis not present

## 2020-09-25 DIAGNOSIS — M0579 Rheumatoid arthritis with rheumatoid factor of multiple sites without organ or systems involvement: Secondary | ICD-10-CM | POA: Diagnosis not present

## 2020-09-25 DIAGNOSIS — M150 Primary generalized (osteo)arthritis: Secondary | ICD-10-CM | POA: Diagnosis not present

## 2020-09-25 DIAGNOSIS — M2550 Pain in unspecified joint: Secondary | ICD-10-CM | POA: Diagnosis not present

## 2020-09-25 LAB — TSH+FREE T4: TSH W/REFLEX TO FT4: 3.74 mIU/L (ref 0.40–4.50)

## 2020-10-01 ENCOUNTER — Encounter: Payer: Self-pay | Admitting: Nurse Practitioner

## 2020-10-01 ENCOUNTER — Ambulatory Visit (INDEPENDENT_AMBULATORY_CARE_PROVIDER_SITE_OTHER): Payer: Medicare Other | Admitting: Nurse Practitioner

## 2020-10-01 ENCOUNTER — Other Ambulatory Visit: Payer: Self-pay

## 2020-10-01 VITALS — BP 120/81 | HR 103 | Ht 67.0 in | Wt 239.0 lb

## 2020-10-01 DIAGNOSIS — E042 Nontoxic multinodular goiter: Secondary | ICD-10-CM

## 2020-10-01 DIAGNOSIS — E039 Hypothyroidism, unspecified: Secondary | ICD-10-CM

## 2020-10-01 NOTE — Patient Instructions (Signed)

## 2020-10-01 NOTE — Progress Notes (Signed)
10/01/2020       Endocrinology Follow Up Visit   SUBJECTIVE:  Past Medical History:  Diagnosis Date   Anemia    PMH   Bronchitis    PMH   Family history of adverse reaction to anesthesia    mother gets PONV   H/O exercise stress test    [facility]   H/O seasonal allergies    Hearing aid worn    B/L   History of underactive thyroid    Hypertension    Hypothyroidism    Osteoarthritis (arthritis due to wear and tear of joints)    Pancreatitis    Rheumatoid arthritis(714.0)    Sleep apnea    mild; does not wear CPAP   Wears glasses     Past Surgical History:  Procedure Laterality Date   CATARACT EXTRACTION W/PHACO Left 09/04/2013   Procedure: CATARACT EXTRACTION PHACO AND INTRAOCULAR LENS PLACEMENT (IOC);  Surgeon: Gemma Payor, MD;  Location: [facility];  Service: Ophthalmology;  Laterality: Left;  CDE:  10.22   CATARACT EXTRACTION W/PHACO Right 09/14/2013   Procedure: CATARACT EXTRACTION PHACO AND INTRAOCULAR LENS PLACEMENT (IOC);  Surgeon: Gemma Payor, MD;  Location: [facility];  Service: Ophthalmology;  Laterality: Right;  CDE:8.81   CHOLECYSTECTOMY     COLONOSCOPY     COLONOSCOPY N/A 06/24/2018   Procedure: COLONOSCOPY;  Surgeon: Corbin Ade, MD;  Location: [facility];  Service: Endoscopy;  Laterality: N/A;  9:45   KNEE ARTHROPLASTY Right 05/20/2015   Procedure: COMPUTER ASSISTED TOTAL KNEE ARTHROPLASTY;  Surgeon: Eldred Manges, MD;  Location: [facility];  Service: Orthopedics;  Laterality: Right;   OVARIAN CYST SURGERY     TOTAL KNEE ARTHROPLASTY Left 11/25/2016   Procedure: LEFT TOTAL KNEE ARTHROPLASTY;  Surgeon: Eldred Manges, MD;  Location: [facility];  Service: Orthopedics;  Laterality: Left;    Thyroid Problem Presents for follow-up (She reports to have been diagnosed with hypothyroidism at age 64 yrs  and has been on various doses of thyroid hormone ever since, until it was stabilized at 100 g by mouth daily.) visit. Patient reports no  anxiety, cold intolerance, constipation, depressed mood, diarrhea, fatigue, heat intolerance, palpitations, tremors, weight gain or weight loss. The symptoms have been stable.    Laura Allen is a 69 y.o.-year-old female, is being engaged in follow-up of hypothyroidism, and nodular goiter.   She is on levothyroxine 100 mcg p.o. daily before breakfast.  She is compliant. No new complaints.  Her weight stayed steady since last visit.  She denies cold intolerance, heat intolerance. No h/o radiation tx to head or neck.   -She was found to have mildly suspicious 1 cm nodule on the left lobe of her thyroid, attempt to biopsy did not yield diagnostic material.  She has no complaints of dysphagia, voice change nor shortness of breath.  Her previsit thyroid ultrasound is unremarkable.   ROS:  Review of systems  Constitutional: + Minimally fluctuating body weight,  current Body mass index is 37.43 kg/m. , no fatigue, no subjective hyperthermia, no subjective hypothermia Eyes: no blurry vision, no xerophthalmia ENT: no sore throat, no nodules palpated in throat, no dysphagia/odynophagia, no hoarseness Cardiovascular: no chest pain, no shortness of breath, no palpitations, no leg swelling Respiratory: no cough, no shortness of breath Gastrointestinal: no nausea/vomiting/diarrhea Musculoskeletal: no muscle/joint aches Skin: no rashes, no hyperemia Neurological: no tremors, no numbness, no tingling, no dizziness Psychiatric: no depression, no anxiety   OBJECTIVE:  BP 120/81  Pulse (!) 103    Ht 5\' 7"  (1.702 m)    Wt 239 lb (108.4 kg)    BMI 37.43 kg/m  Wt Readings from Last 3 Encounters:  10/01/20 239 lb (108.4 kg)  11/22/19 221 lb 9.6 oz (100.5 kg)  09/22/17 230 lb (104.3 kg)   BP Readings from Last 3 Encounters:  10/01/20 120/81  07/26/20 136/82  01/24/20 (!) 144/64    Physical Exam- Limited  Constitutional:  Body mass index is 37.43 kg/m. , not in acute distress, normal  state of mind Eyes:  EOMI, no exophthalmos Neck: Supple Thyroid: No gross goiter Cardiovascular: RRR, no murmers, rubs, or gallops, no edema Respiratory: Adequate breathing efforts, no crackles, rales, rhonchi, + inspiratory wheezing (had to use her inhaler last night- has severe allergies) Musculoskeletal: no gross deformities, strength intact in all four extremities, no gross restriction of joint movements Skin:  no rashes, no hyperemia Neurological: no tremor with outstretched hands  Recent Results (from the past 2160 hour(s))  TSH + free T4     Status: None   Collection Time: 09/24/20  9:34 AM  Result Value Ref Range   TSH W/REFLEX TO FT4 3.74 0.40 - 4.50 mIU/L    ASSESSMENT / PLAN: 1. Hypothyroidism  Patient with long-standing hypothyroidism, on levothyroxine therapy.  -Her previsit TFTs are consistent with appropriate hormone replacement.  She is advised to continue Levothyroxine 100 mcg po daily before breakfast.   - We discussed about the correct intake of her thyroid hormone, on empty stomach at fasting, with water, separated by at least 30 minutes from breakfast and other medications,  and separated by more than 4 hours from calcium, iron, multivitamins, acid reflux medications (PPIs). -Patient is made aware of the fact that thyroid hormone replacement is needed for life, dose to be adjusted by periodic monitoring of thyroid function tests.  2. Multinodular goiter -  Her follow-up ultrasound of the thyroid from 7/28/21shows stable findings, unremarkable and does not require dedicated follow up or surveillance moving forward.     - Time spent on this patient care encounter:  20 minutes of which 50% was spent in  counseling and the rest reviewing  her current and  previous labs / studies and medications  doses and developing a plan for long term care. 06/07/20  participated in the discussions, expressed understanding, and voiced agreement with the above plans.  All  questions were answered to her satisfaction. she is encouraged to contact clinic should she have any questions or concerns prior to her return visit.     Follow Up Plan: Return in about 1 year (around 10/01/2021) for Thyroid follow up, Previsit labs.     10/03/2021, [facility] [address] [place], [place] Phone: [phone] Fax: [phone] 10/01/2020, 9:44 AM

## 2020-10-30 DIAGNOSIS — Z681 Body mass index (BMI) 19 or less, adult: Secondary | ICD-10-CM | POA: Diagnosis not present

## 2020-10-30 DIAGNOSIS — J45909 Unspecified asthma, uncomplicated: Secondary | ICD-10-CM | POA: Diagnosis not present

## 2020-10-30 DIAGNOSIS — J069 Acute upper respiratory infection, unspecified: Secondary | ICD-10-CM | POA: Diagnosis not present

## 2020-11-06 DIAGNOSIS — M810 Age-related osteoporosis without current pathological fracture: Secondary | ICD-10-CM | POA: Diagnosis not present

## 2020-12-02 ENCOUNTER — Other Ambulatory Visit: Payer: Self-pay | Admitting: "Endocrinology

## 2020-12-03 ENCOUNTER — Other Ambulatory Visit: Payer: Self-pay | Admitting: Allergy & Immunology

## 2021-01-14 DIAGNOSIS — M150 Primary generalized (osteo)arthritis: Secondary | ICD-10-CM | POA: Diagnosis not present

## 2021-01-14 DIAGNOSIS — M0579 Rheumatoid arthritis with rheumatoid factor of multiple sites without organ or systems involvement: Secondary | ICD-10-CM | POA: Diagnosis not present

## 2021-01-14 DIAGNOSIS — Z79899 Other long term (current) drug therapy: Secondary | ICD-10-CM | POA: Diagnosis not present

## 2021-01-14 DIAGNOSIS — M2550 Pain in unspecified joint: Secondary | ICD-10-CM | POA: Diagnosis not present

## 2021-01-24 ENCOUNTER — Ambulatory Visit (INDEPENDENT_AMBULATORY_CARE_PROVIDER_SITE_OTHER): Payer: Medicare Other | Admitting: Allergy & Immunology

## 2021-01-24 ENCOUNTER — Encounter: Payer: Self-pay | Admitting: Allergy & Immunology

## 2021-01-24 ENCOUNTER — Other Ambulatory Visit: Payer: Self-pay

## 2021-01-24 VITALS — BP 128/82 | HR 97 | Resp 19

## 2021-01-24 DIAGNOSIS — J3089 Other allergic rhinitis: Secondary | ICD-10-CM | POA: Diagnosis not present

## 2021-01-24 DIAGNOSIS — J4540 Moderate persistent asthma, uncomplicated: Secondary | ICD-10-CM | POA: Diagnosis not present

## 2021-01-24 DIAGNOSIS — J302 Other seasonal allergic rhinitis: Secondary | ICD-10-CM

## 2021-01-24 NOTE — Progress Notes (Signed)
FOLLOW UP  Date of Service/Encounter:  01/24/21   Assessment:   Moderate persistent asthma, uncomplicated  Seasonal and perennial allergic rhinitis(grasses, weeds, trees, indoor molds, outdoor molds, dust mites, dog and cockroach)  Rheumatoid arthritis - on Humira, MTX, and folic acid  Passive smoke exposure- likely worsening her breathing status  Plan/Recommendations:   1. Moderate persistent asthma, uncomplicated - Lung testing looks phenomenal today. - We will continue with the same medication regimen since this is working so well. - Daily controller medication(s): Breo 100/25mcg one puff once daily - Prior to physical activity: albuterol 2 puffs 10-15 minutes before physical activity. - Rescue medications: albuterol 4 puffs every 4-6 hours as needed or albuterol nebulizer one vial every 4-6 hours as needed - Asthma control goals:  * Full participation in all desired activities (may need albuterol before activity) * Albuterol use two time or less a week on average (not counting use with activity) * Cough interfering with sleep two time or less a month * Oral steroids no more than once a year * No hospitalizations  2. Chronic rhinitis (grasses, weeds, trees, indoor molds, outdoor molds, dust mites, dog and cockroach) - I think you are doing very well.  - We are not going to make any changes at this point. - Continue taking: Zyrtec (cetirizine) 10mg  tablet once 1-2 times daily and Dymista (fluticasone/azelastine) two sprays per nostril 1-2 times daily - Consider nasal saline rinses 1-2 times daily to remove allergens from the nasal cavities as well as help with mucous clearance (this is especially helpful to do before the nasal sprays are given) - You are doing VERY well!   3. Return in about 6 months (around 07/27/2021).    Subjective:   Laura Allen is a 70 y.o. female presenting today for follow up of  Chief Complaint  Patient presents with  . Asthma     Laura Allen has a history of the following: Patient Active Problem List   Diagnosis Date Noted  . Left knee DJD 11/25/2016  . Multinodular goiter 03/19/2016  . Primary hypothyroidism 09/11/2015  . Status post total right knee replacement 05/20/2015  . Rheumatic joint disease (HCC) 12/03/2011  . Primary osteoarthritis of left knee 12/03/2011    History obtained from: chart review and patient.  Laura Allen is a 70 y.o. female presenting for a follow up visit.  He was last seen in September 2021.  At that time, we deferred her lung testing.  We continued Breo 100 mcg 1 puff twice daily as well as albuterol as needed.  For her allergic rhinitis, we continued with Zyrtec 1-2 times daily as well as Dymista 2 sprays per nostril up to twice daily.  We also discussed using allergy shots for long-term control.  Since the last visit, she has mostly done well.    Asthma/Respiratory Symptom History: She did have an asthma attack in November. She received a steroid shot. She got a course of Augmentin as well. She thought that she was going to get worse but she did well. She did have prednisone from her PCP on board, but she did not use it at all. She did have another asthma attack in January and used her nebulizer for three days. She remains on the Breo one puff once daily. She pays $30 for it each month. She is not having difficulty affording her medications. She does use cough drops often to have on hand just in case of coughing episodes. She is overall doing  much better from a breathing perspective. She is very happy with the [facility].   Allergic Rhinitis Symptom History: She does use her cetirizine twice daily on bad days. She has eye drops on board and uses those. Rain is helping with the pollen. Fall is the worst for her. They did buy an air purifier for the living room and the bedroom and this has helped a lot.    She does need her Moderna booster. She has only had her routine two shot series in  early 2021. She is open to getting her booster but would like to stick with Moderna.    Otherwise, there have been no changes to her past medical history, surgical history, family history, or social history.     Review of Systems  Constitutional: Negative.  Negative for fever, malaise/fatigue and weight loss.  HENT: Negative.  Negative for congestion, ear discharge and ear pain.   Eyes: Negative for pain, discharge and redness.  Respiratory: Negative for cough, sputum production, shortness of breath and wheezing.   Cardiovascular: Negative.  Negative for chest pain and palpitations.  Gastrointestinal: Negative for abdominal pain and heartburn.  Skin: Negative.  Negative for itching and rash.  Neurological: Negative for dizziness and headaches.  Endo/Heme/Allergies: Negative for environmental allergies. Does not bruise/bleed easily.       Objective:   Blood pressure 128/82, pulse 97, resp. rate 19, SpO2 98 %. There is no height or weight on file to calculate BMI.   Physical Exam:  Physical Exam Constitutional:      Appearance: She is well-developed.     Comments: Very pleasant and interactive.   HENT:     Head: Normocephalic and atraumatic.     Right Ear: Tympanic membrane, ear canal and external ear normal.     Left Ear: Tympanic membrane, ear canal and external ear normal.     Nose: No nasal deformity, septal deviation, mucosal edema or rhinorrhea.     Right Turbinates: Enlarged and swollen.     Left Turbinates: Enlarged and swollen.     Right Sinus: No maxillary sinus tenderness or frontal sinus tenderness.     Left Sinus: No maxillary sinus tenderness or frontal sinus tenderness.     Mouth/Throat:     Lips: Pink.     Mouth: Mucous membranes are moist. Mucous membranes are not pale and not dry.     Pharynx: Uvula midline.     Comments: Cobblestoning in the posterior oropharynx. t Eyes:     General:        Right eye: No discharge.        Left eye: No discharge.      Conjunctiva/sclera: Conjunctivae normal.     Right eye: Right conjunctiva is not injected. No chemosis.    Left eye: Left conjunctiva is not injected. No chemosis.    Pupils: Pupils are equal, round, and reactive to light.  Cardiovascular:     Rate and Rhythm: Normal rate and regular rhythm.     Heart sounds: Normal heart sounds.  Pulmonary:     Effort: Pulmonary effort is normal. No tachypnea, accessory muscle usage or respiratory distress.     Breath sounds: Normal breath sounds. No wheezing, rhonchi or rales.     Comments: Moving air well in all lung fields. No increased work of breathing noted.  Chest:     Chest wall: No tenderness.  Lymphadenopathy:     Cervical: No cervical adenopathy.  Skin:    General: Skin is warm.  Capillary Refill: Capillary refill takes less than 2 seconds.     Coloration: Skin is not pale.     Findings: No abrasion, erythema, petechiae or rash. Rash is not papular, urticarial or vesicular.     Comments: No eczematous or urticarial lesions noted.   Neurological:     Mental Status: She is alert.  Psychiatric:        Behavior: Behavior is cooperative.      Diagnostic studies:    Spirometry: results normal (FEV1: 2.67/102%, FVC: 3.23/94%, FEV1/FVC: 83%).    Spirometry consistent with normal pattern.         Malachi Bonds, MD  [facility]

## 2021-01-24 NOTE — Patient Instructions (Addendum)
1. Moderate persistent asthma, uncomplicated - Lung testing looks phenomenal today. - We will continue with the same medication regimen since this is working so well. - Daily controller medication(s): Breo 100/14mcg one puff once daily - Prior to physical activity: albuterol 2 puffs 10-15 minutes before physical activity. - Rescue medications: albuterol 4 puffs every 4-6 hours as needed or albuterol nebulizer one vial every 4-6 hours as needed - Asthma control goals:  * Full participation in all desired activities (may need albuterol before activity) * Albuterol use two time or less a week on average (not counting use with activity) * Cough interfering with sleep two time or less a month * Oral steroids no more than once a year * No hospitalizations  2. Chronic rhinitis (grasses, weeds, trees, indoor molds, outdoor molds, dust mites, dog and cockroach) - I think you are doing very well.  - We are not going to make any changes at this point. - Continue taking: Zyrtec (cetirizine) 10mg  tablet once 1-2 times daily and Dymista (fluticasone/azelastine) two sprays per nostril 1-2 times daily - Consider nasal saline rinses 1-2 times daily to remove allergens from the nasal cavities as well as help with mucous clearance (this is especially helpful to do before the nasal sprays are given) - You are doing VERY well!   3. Return in about 6 months (around 07/27/2021).    Please inform 07/29/2021 of any Emergency Department visits, hospitalizations, or changes in symptoms. Call us before going to the ED for breathing or allergy symptoms since we might be able to fit you in for a sick visit. Feel free to contact us anytime with any questions, problems, or concerns.  It was a pleasure to see you again today!  Websites that have reliable patient information: 1. American Academy of Asthma, Allergy, and Immunology: www.aaaai.org 2. Food Allergy Research and Education (FARE): foodallergy.org 3. Mothers of  Asthmatics: http://www.asthmacommunitynetwork.org 4. American College of Allergy, Asthma, and Immunology: www.acaai.org   COVID-19 Vaccine Information can be found at: [place] For questions related to vaccine distribution or appointments, please email [email or web address] or call [phone].   We realize that you might be concerned about having an allergic reaction to the COVID19 vaccines. To help with that concern, WE ARE OFFERING THE COVID19 VACCINES IN OUR OFFICE! Ask the front desk for dates!     "Like" [phone] on Facebook and Instagram for our latest updates!      A healthy democracy works best when [place] participate! Make sure you are registered to vote! If you have moved or changed any of your contact information, you will need to get this updated before voting!  In some cases, you MAY be able to register to vote online: [employer]

## 2021-03-03 ENCOUNTER — Other Ambulatory Visit: Payer: Self-pay | Admitting: Nurse Practitioner

## 2021-03-11 ENCOUNTER — Telehealth: Payer: Self-pay

## 2021-03-11 NOTE — Telephone Encounter (Signed)
I will complete this tomorrow

## 2021-03-11 NOTE — Telephone Encounter (Signed)
[facility] office would like pre-medication note/letter faxed to [phone].  CB# [phone].  Please advise.  Thank you

## 2021-03-12 NOTE — Telephone Encounter (Signed)
Letter faxed.

## 2021-03-12 NOTE — Telephone Encounter (Signed)
Letter completed.

## 2021-04-22 DIAGNOSIS — M150 Primary generalized (osteo)arthritis: Secondary | ICD-10-CM | POA: Diagnosis not present

## 2021-04-22 DIAGNOSIS — Z79899 Other long term (current) drug therapy: Secondary | ICD-10-CM | POA: Diagnosis not present

## 2021-04-22 DIAGNOSIS — M0579 Rheumatoid arthritis with rheumatoid factor of multiple sites without organ or systems involvement: Secondary | ICD-10-CM | POA: Diagnosis not present

## 2021-04-22 DIAGNOSIS — M2550 Pain in unspecified joint: Secondary | ICD-10-CM | POA: Diagnosis not present

## 2021-05-08 DIAGNOSIS — M810 Age-related osteoporosis without current pathological fracture: Secondary | ICD-10-CM | POA: Diagnosis not present

## 2021-05-29 ENCOUNTER — Other Ambulatory Visit: Payer: Self-pay | Admitting: Nurse Practitioner

## 2021-06-08 ENCOUNTER — Other Ambulatory Visit: Payer: Self-pay | Admitting: Allergy & Immunology

## 2021-07-09 ENCOUNTER — Other Ambulatory Visit: Payer: Self-pay | Admitting: Allergy & Immunology

## 2021-07-09 NOTE — Telephone Encounter (Signed)
Please advise to change from breo to wixela 250?

## 2021-07-23 DIAGNOSIS — M150 Primary generalized (osteo)arthritis: Secondary | ICD-10-CM | POA: Diagnosis not present

## 2021-07-23 DIAGNOSIS — Z79899 Other long term (current) drug therapy: Secondary | ICD-10-CM | POA: Diagnosis not present

## 2021-07-23 DIAGNOSIS — M2550 Pain in unspecified joint: Secondary | ICD-10-CM | POA: Diagnosis not present

## 2021-07-23 DIAGNOSIS — M0579 Rheumatoid arthritis with rheumatoid factor of multiple sites without organ or systems involvement: Secondary | ICD-10-CM | POA: Diagnosis not present

## 2021-07-30 ENCOUNTER — Other Ambulatory Visit: Payer: Self-pay

## 2021-07-30 ENCOUNTER — Encounter: Payer: Self-pay | Admitting: Allergy & Immunology

## 2021-07-30 ENCOUNTER — Ambulatory Visit: Payer: Medicare Other | Admitting: Allergy & Immunology

## 2021-07-30 VITALS — BP 120/90 | HR 101 | Temp 97.8°F | Ht 67.0 in | Wt 238.0 lb

## 2021-07-30 DIAGNOSIS — J3089 Other allergic rhinitis: Secondary | ICD-10-CM | POA: Diagnosis not present

## 2021-07-30 DIAGNOSIS — J4540 Moderate persistent asthma, uncomplicated: Secondary | ICD-10-CM | POA: Diagnosis not present

## 2021-07-30 DIAGNOSIS — J302 Other seasonal allergic rhinitis: Secondary | ICD-10-CM | POA: Diagnosis not present

## 2021-07-30 MED ORDER — AZELASTINE-FLUTICASONE 137-50 MCG/ACT NA SUSP: 2.0000 | Freq: Two times a day (BID) | NASAL | 5 refills | Status: DC | PRN

## 2021-07-30 MED ORDER — ALBUTEROL SULFATE HFA 108 (90 BASE) MCG/ACT IN AERS: 2.0000 | INHALATION_SPRAY | RESPIRATORY_TRACT | 1 refills | Status: DC | PRN

## 2021-07-30 NOTE — Addendum Note (Signed)
Addended by: Briant Cedar L on: 07/30/2021 03:00 PM   Modules accepted: Orders

## 2021-07-30 NOTE — Progress Notes (Signed)
FOLLOW UP  Date of Service/Encounter:  07/30/21   Assessment:   Moderate persistent asthma, uncomplicated   Seasonal and perennial allergic rhinitis (grasses, weeds, trees, indoor molds, outdoor molds, dust mites, dog and cockroach)   Rheumatoid arthritis - on Humira, MTX, and folic acid   Passive smoke exposure - likely worsening her breathing status    Plan/Recommendations:   1. Moderate persistent asthma, uncomplicated - Lung testing looks phenomenal today. - We will continue with the same medication regimen since this is working so well. - Daily controller medication(s): Breo 100/103mcg one puff once daily - Prior to physical activity: albuterol 2 puffs 10-15 minutes before physical activity. - Rescue medications: albuterol 4 puffs every 4-6 hours as needed or albuterol nebulizer one vial every 4-6 hours as needed - Asthma control goals:  * Full participation in all desired activities (may need albuterol before activity) * Albuterol use two time or less a week on average (not counting use with activity) * Cough interfering with sleep two time or less a month * Oral steroids no more than once a year * No hospitalizations  2. Chronic rhinitis (grasses, weeds, trees, indoor molds, outdoor molds, dust mites, dog and cockroach) - I think you are doing very well.  - We are not going to make any changes at this point. - Continue taking: Zyrtec (cetirizine) 10mg  tablet once 1-2 times daily and Dymista (fluticasone/azelastine) two sprays per nostril 1-2 times daily - You can consider changing up your antihistamines if needed, but the cetirizine seems to be working well.   3. Return in about 6 months (around 01/27/2022).    Subjective:   Laura Allen is a 70 y.o. female presenting today for follow up of No chief complaint on file.   Laura Allen has a history of the following: Patient Active Problem List   Diagnosis Date Noted   Left knee DJD 11/25/2016    Multinodular goiter 03/19/2016   Primary hypothyroidism 09/11/2015   Status post total right knee replacement 05/20/2015   Rheumatic joint disease (HCC) 12/03/2011   Primary osteoarthritis of left knee 12/03/2011    History obtained from: chart review and patient.  Laura Allen is a 70 y.o. female presenting for a follow up visit.  She was last seen in March 2022.  At that time, lung testing looked great.  We will continue with the same medication regimen since she was doing so well.  For her rhinitis, we continue with Zyrtec as well as Dymista.  Since last visit,she has done well.   Asthma/Respiratory Symptom History: She remains on the Breo one puff once daily. She is doing very well with this without any antibiotics. She is swimming daily. She falls asleep right away when hitting the pillow.   Allergic Rhinitis Symptom History: She is on her cetirizine every day. She does not use the nose spray routinely. She does not change her antihistamine  routinely. She ended up putting her dog down since the last visit. She had congestive heart failure and was 15.   She is an avid [employer]. She prefers the [employer] but also watches the [employer]. She started watching the [employer] because they hired a Financial risk analyst member, Dispensing optician.  She tells me that the [employer] were stupid to let this guy go.   Otherwise, there have been no changes to her past medical history, surgical history, family history, or social history.    Review of Systems  Constitutional: Negative.  Negative for fever, malaise/fatigue  and weight loss.  HENT: Negative.  Negative for congestion, ear discharge and ear pain.   Eyes:  Negative for pain, discharge and redness.  Respiratory:  Negative for cough, sputum production, shortness of breath and wheezing.   Cardiovascular: Negative.  Negative for chest pain and palpitations.  Gastrointestinal:  Negative for abdominal pain, heartburn, nausea and vomiting.  Skin: Negative.   Negative for itching and rash.  Neurological:  Negative for dizziness and headaches.  Endo/Heme/Allergies:  Negative for environmental allergies. Does not bruise/bleed easily.      Objective:   Blood pressure 120/90, pulse (!) 101, temperature 97.8 F (36.6 C), height 5\' 7"  (1.702 m), weight 238 lb (108 kg), SpO2 96 %. Body mass index is 37.28 kg/m.   Physical Exam:  Physical Exam Vitals reviewed.  Constitutional:      Appearance: She is well-developed.  HENT:     Head: Normocephalic and atraumatic.     Right Ear: Tympanic membrane, ear canal and external ear normal.     Left Ear: Tympanic membrane, ear canal and external ear normal.     Nose: No nasal deformity, septal deviation, mucosal edema or rhinorrhea.     Right Turbinates: Enlarged, swollen and pale.     Left Turbinates: Enlarged, swollen and pale.     Right Sinus: No maxillary sinus tenderness or frontal sinus tenderness.     Left Sinus: No maxillary sinus tenderness or frontal sinus tenderness.     Mouth/Throat:     Mouth: Mucous membranes are not pale and not dry.     Pharynx: Uvula midline.  Eyes:     General: Lids are normal. No allergic shiner.       Right eye: No discharge.        Left eye: No discharge.     Conjunctiva/sclera: Conjunctivae normal.     Right eye: Right conjunctiva is not injected. No chemosis.    Left eye: Left conjunctiva is not injected. No chemosis.    Pupils: Pupils are equal, round, and reactive to light.  Cardiovascular:     Rate and Rhythm: Normal rate and regular rhythm.     Heart sounds: Normal heart sounds.  Pulmonary:     Effort: Pulmonary effort is normal. No tachypnea, accessory muscle usage or respiratory distress.     Breath sounds: Normal breath sounds. No wheezing, rhonchi or rales.     Comments: Moving air well in all lung fields. No increased work of breathing noted.  Chest:     Chest wall: No tenderness.  Lymphadenopathy:     Cervical: No cervical adenopathy.   Skin:    General: Skin is warm.     Capillary Refill: Capillary refill takes less than 2 seconds.     Coloration: Skin is not pale.     Findings: No abrasion, erythema, petechiae or rash. Rash is not papular, urticarial or vesicular.     Comments: No eczematous or urticarial lesions noted.   Neurological:     Mental Status: She is alert.  Psychiatric:        Behavior: Behavior is cooperative.     Diagnostic studies:   Spirometry: results normal (FEV1: 2.79/114%, FVC: 3.45/108%, FEV1/FVC: 81%).    Spirometry consistent with normal pattern.   Allergy Studies: none        , MD  [facility]

## 2021-07-30 NOTE — Patient Instructions (Addendum)
1. Moderate persistent asthma, uncomplicated - Lung testing looks phenomenal today. - We will continue with the same medication regimen since this is working so well. - Daily controller medication(s): Breo 100/25mcg one puff once daily - Prior to physical activity: albuterol 2 puffs 10-15 minutes before physical activity. - Rescue medications: albuterol 4 puffs every 4-6 hours as needed or albuterol nebulizer one vial every 4-6 hours as needed - Asthma control goals:  * Full participation in all desired activities (may need albuterol before activity) * Albuterol use two time or less a week on average (not counting use with activity) * Cough interfering with sleep two time or less a month * Oral steroids no more than once a year * No hospitalizations  2. Chronic rhinitis (grasses, weeds, trees, indoor molds, outdoor molds, dust mites, dog and cockroach) - I think you are doing very well.  - We are not going to make any changes at this point. - Continue taking: Zyrtec (cetirizine) 10mg  tablet once 1-2 times daily and Dymista (fluticasone/azelastine) two sprays per nostril 1-2 times daily - You can consider changing up your antihistamines if needed, but the cetirizine seems to be working well.   3. Return in about 6 months (around 01/27/2022).    Please inform 01/29/2022 of any Emergency Department visits, hospitalizations, or changes in symptoms. Call us before going to the ED for breathing or allergy symptoms since we might be able to fit you in for a sick visit. Feel free to contact us anytime with any questions, problems, or concerns.  It was a pleasure to see you again today!  Websites that have reliable patient information: 1. American Academy of Asthma, Allergy, and Immunology: www.aaaai.org 2. Food Allergy Research and Education (FARE): foodallergy.org 3. Mothers of Asthmatics: http://www.asthmacommunitynetwork.org 4. American College of Allergy, Asthma, and Immunology:  www.acaai.org   COVID-19 Vaccine Information can be found at: [place] For questions related to vaccine distribution or appointments, please email [email or web address] or call [phone].   We realize that you might be concerned about having an allergic reaction to the COVID19 vaccines. To help with that concern, WE ARE OFFERING THE COVID19 VACCINES IN OUR OFFICE! Ask the front desk for dates!     "Like" [phone] on Facebook and Instagram for our latest updates!      A healthy democracy works best when [place] participate! Make sure you are registered to vote! If you have moved or changed any of your contact information, you will need to get this updated before voting!  In some cases, you MAY be able to register to vote online: [employer]

## 2021-08-19 DIAGNOSIS — H524 Presbyopia: Secondary | ICD-10-CM | POA: Diagnosis not present

## 2021-08-19 DIAGNOSIS — H43393 Other vitreous opacities, bilateral: Secondary | ICD-10-CM | POA: Diagnosis not present

## 2021-08-19 DIAGNOSIS — H52223 Regular astigmatism, bilateral: Secondary | ICD-10-CM | POA: Diagnosis not present

## 2021-08-28 ENCOUNTER — Other Ambulatory Visit: Payer: Self-pay | Admitting: Nurse Practitioner

## 2021-09-24 ENCOUNTER — Telehealth: Payer: Self-pay | Admitting: Nurse Practitioner

## 2021-09-24 ENCOUNTER — Other Ambulatory Visit: Payer: Self-pay

## 2021-09-24 DIAGNOSIS — E039 Hypothyroidism, unspecified: Secondary | ICD-10-CM

## 2021-09-24 NOTE — Telephone Encounter (Signed)
Done

## 2021-09-24 NOTE — Telephone Encounter (Signed)
Pt 1 year f/u is next week therefore her lab orders have expired. Can you place new ones for [facility].

## 2021-09-25 DIAGNOSIS — E039 Hypothyroidism, unspecified: Secondary | ICD-10-CM | POA: Diagnosis not present

## 2021-09-26 LAB — T4, FREE: Free T4: 1.67 ng/dL (ref 0.82–1.77)

## 2021-09-26 LAB — TSH: TSH: 2.42 u[IU]/mL (ref 0.450–4.500)

## 2021-10-01 ENCOUNTER — Encounter: Payer: Self-pay | Admitting: Nurse Practitioner

## 2021-10-01 ENCOUNTER — Other Ambulatory Visit: Payer: Self-pay

## 2021-10-01 ENCOUNTER — Ambulatory Visit: Payer: Medicare Other | Admitting: Nurse Practitioner

## 2021-10-01 VITALS — BP 111/73 | HR 96 | Ht 67.0 in | Wt 238.6 lb

## 2021-10-01 DIAGNOSIS — E039 Hypothyroidism, unspecified: Secondary | ICD-10-CM | POA: Diagnosis not present

## 2021-10-01 DIAGNOSIS — E042 Nontoxic multinodular goiter: Secondary | ICD-10-CM | POA: Diagnosis not present

## 2021-10-01 MED ORDER — LEVOTHYROXINE SODIUM 100 MCG PO TABS: ORAL_TABLET | ORAL | 3 refills | Status: DC

## 2021-10-01 NOTE — Progress Notes (Signed)
10/01/2021       Endocrinology Follow Up Visit  ------------------------------------------------------------------------------------------------------------------------------- SUBJECTIVE:  Past Medical History:  Diagnosis Date   Anemia    PMH   Asthma    Bronchitis    PMH   Family history of adverse reaction to anesthesia    mother gets PONV   H/O exercise stress test    [facility]   H/O seasonal allergies    Hearing aid worn    B/L   History of underactive thyroid    Hypertension    Hypothyroidism    Osteoarthritis (arthritis due to wear and tear of joints)    Pancreatitis    Rheumatoid arthritis(714.0)    Sleep apnea    mild; does not wear CPAP   Wears glasses     Past Surgical History:  Procedure Laterality Date   CATARACT EXTRACTION W/PHACO Left 09/04/2013   Procedure: CATARACT EXTRACTION PHACO AND INTRAOCULAR LENS PLACEMENT (IOC);  Surgeon: Gemma Payor, MD;  Location: [facility];  Service: Ophthalmology;  Laterality: Left;  CDE:  10.22   CATARACT EXTRACTION W/PHACO Right 09/14/2013   Procedure: CATARACT EXTRACTION PHACO AND INTRAOCULAR LENS PLACEMENT (IOC);  Surgeon: Gemma Payor, MD;  Location: [facility];  Service: Ophthalmology;  Laterality: Right;  CDE:8.81   CHOLECYSTECTOMY     COLONOSCOPY     COLONOSCOPY N/A 06/24/2018   Procedure: COLONOSCOPY;  Surgeon: Corbin Ade, MD;  Location: [facility];  Service: Endoscopy;  Laterality: N/A;  9:45   KNEE ARTHROPLASTY Right 05/20/2015   Procedure: COMPUTER ASSISTED TOTAL KNEE ARTHROPLASTY;  Surgeon: Eldred Manges, MD;  Location: [facility];  Service: Orthopedics;  Laterality: Right;   OVARIAN CYST SURGERY     TOTAL KNEE ARTHROPLASTY Left 11/25/2016   Procedure: LEFT TOTAL KNEE ARTHROPLASTY;  Surgeon: Eldred Manges, MD;  Location: [facility] OR;  Service: Orthopedics;  Laterality: Left;    Thyroid Problem Presents for follow-up (She reports to have been diagnosed with hypothyroidism at age 38 yrs  and has been on various  doses of thyroid hormone ever since, until it was stabilized at 100 g by mouth daily.) visit. Patient reports no anxiety, cold intolerance, constipation, depressed mood, diarrhea, fatigue, heat intolerance, palpitations, tremors, weight gain or weight loss. The symptoms have been stable.   Laura Allen is a 70 y.o.-year-old female, is being engaged in follow-up of hypothyroidism, and nodular goiter.   She is on levothyroxine 100 mcg p.o. daily before breakfast.  She is compliant. No new complaints.  Her weight stayed steady since last visit.  She denies cold intolerance, heat intolerance. No h/o radiation tx to head or neck.   -She was found to have mildly suspicious 1 cm nodule on the left lobe of her thyroid, attempt to biopsy did not yield diagnostic material.  She has no complaints of dysphagia, voice change nor shortness of breath.  Her previsit thyroid ultrasound is unremarkable.   ROS:  Review of systems  Constitutional: + Minimally fluctuating body weight,  current Body mass index is 37.37 kg/m. , no fatigue, no subjective hyperthermia, no subjective hypothermia Eyes: no blurry vision, no xerophthalmia ENT: no sore throat, no nodules palpated in throat, no dysphagia/odynophagia, no hoarseness Cardiovascular: no chest pain, no shortness of breath, no palpitations, no leg swelling Respiratory: no cough, no shortness of breath Gastrointestinal: no nausea/vomiting/diarrhea Musculoskeletal: no muscle/joint aches Skin: no rashes, no hyperemia Neurological: no tremors, no numbness, no tingling, no dizziness Psychiatric: no depression, no anxiety  ------------------------------------------------------------------------------------------------------------------------------- OBJECTIVE:  BP 111/73  Pulse 96   Ht 5\' 7"  (1.702 m)   Wt 238 lb 9.6 oz (108.2 kg)   BMI 37.37 kg/m  Wt Readings from Last 3 Encounters:  10/01/21 238 lb 9.6 oz (108.2 kg)  07/30/21 238 lb (108 kg)   10/01/20 239 lb (108.4 kg)   BP Readings from Last 3 Encounters:  10/01/21 111/73  07/30/21 120/90  01/24/21 128/82     Physical Exam- Limited  Constitutional:  Body mass index is 37.37 kg/m. , not in acute distress, normal state of mind Eyes:  EOMI, no exophthalmos Neck: Supple Cardiovascular: RRR, no murmurs, rubs, or gallops, no edema Respiratory: Adequate breathing efforts, no crackles, rales, rhonchi, or wheezing Musculoskeletal: no gross deformities, strength intact in all four extremities, no gross restriction of joint movements Skin:  no rashes, no hyperemia Neurological: no tremor with outstretched hands    Recent Results (from the past 2160 hour(s))  T4, Free     Status: None   Collection Time: 09/25/21  9:49 AM  Result Value Ref Range   Free T4 1.67 0.82 - 1.77 ng/dL  TSH     Status: None   Collection Time: 09/25/21  9:49 AM  Result Value Ref Range   TSH 2.420 0.450 - 4.500 uIU/mL   ------------------------------------------------------------------------------------------------------------------------------- ASSESSMENT / PLAN: 1. Hypothyroidism  Patient with long-standing hypothyroidism, on levothyroxine therapy.   -Her previsit TFTs are consistent with appropriate hormone replacement.  She is advised to continue Levothyroxine 100 mcg po daily before breakfast.   - We discussed about the correct intake of her thyroid hormone, on empty stomach at fasting, with water, separated by at least 30 minutes from breakfast and other medications,  and separated by more than 4 hours from calcium, iron, multivitamins, acid reflux medications (PPIs). -Patient is made aware of the fact that thyroid hormone replacement is needed for life, dose to be adjusted by periodic monitoring of thyroid function tests.  2. Multinodular goiter -  Her follow-up ultrasound of the thyroid from 06/05/20 shows stable findings, unremarkable and does not require dedicated follow up or  surveillance moving forward.    I spent 20 minutes in the care of the patient today including review of labs from Thyroid Function, CMP, and other relevant labs ; imaging/biopsy records (current and previous including abstractions from other facilities); face-to-face time discussing  her lab results and symptoms, medications doses, her options of short and long term treatment based on the latest standards of care / guidelines;   and documenting the encounter.  06/07/20  participated in the discussions, expressed understanding, and voiced agreement with the above plans.  All questions were answered to her satisfaction. she is encouraged to contact clinic should she have any questions or concerns prior to her return visit.     Follow Up Plan: Return in about 1 year (around 10/01/2022) for Thyroid follow up, Previsit labs.     10/03/2022, [facility] [address] [place], [place] Phone: [phone] Fax: [phone]   10/01/2021, 10:11 AM

## 2021-10-01 NOTE — Patient Instructions (Signed)

## 2021-11-17 DIAGNOSIS — M0579 Rheumatoid arthritis with rheumatoid factor of multiple sites without organ or systems involvement: Secondary | ICD-10-CM | POA: Diagnosis not present

## 2021-11-17 DIAGNOSIS — M810 Age-related osteoporosis without current pathological fracture: Secondary | ICD-10-CM | POA: Diagnosis not present

## 2021-12-01 ENCOUNTER — Other Ambulatory Visit: Payer: Self-pay | Admitting: Allergy & Immunology

## 2022-01-09 ENCOUNTER — Other Ambulatory Visit: Payer: Self-pay | Admitting: Allergy & Immunology

## 2022-01-21 DIAGNOSIS — M810 Age-related osteoporosis without current pathological fracture: Secondary | ICD-10-CM | POA: Diagnosis not present

## 2022-01-21 DIAGNOSIS — M1991 Primary osteoarthritis, unspecified site: Secondary | ICD-10-CM | POA: Diagnosis not present

## 2022-01-21 DIAGNOSIS — Z79899 Other long term (current) drug therapy: Secondary | ICD-10-CM | POA: Diagnosis not present

## 2022-01-21 DIAGNOSIS — M0579 Rheumatoid arthritis with rheumatoid factor of multiple sites without organ or systems involvement: Secondary | ICD-10-CM | POA: Diagnosis not present

## 2022-01-28 ENCOUNTER — Encounter: Payer: Self-pay | Admitting: Allergy & Immunology

## 2022-01-28 ENCOUNTER — Ambulatory Visit: Payer: Medicare Other | Admitting: Allergy & Immunology

## 2022-01-28 ENCOUNTER — Other Ambulatory Visit: Payer: Self-pay

## 2022-01-28 VITALS — BP 134/86 | HR 98 | Temp 97.3°F | Resp 16 | Ht 67.0 in | Wt 239.2 lb

## 2022-01-28 DIAGNOSIS — J4540 Moderate persistent asthma, uncomplicated: Secondary | ICD-10-CM

## 2022-01-28 DIAGNOSIS — J3089 Other allergic rhinitis: Secondary | ICD-10-CM | POA: Diagnosis not present

## 2022-01-28 NOTE — Patient Instructions (Addendum)
1. Moderate persistent asthma, uncomplicated ?- Lung testing looks phenomenal today. ?- We will continue with the same medication regimen since this is working so well. ?- Daily controller medication(s): Breo 100/6125mcg one puff once daily ?- Prior to physical activity: albuterol 2 puffs 10-15 minutes before physical activity. ?- Rescue medications: albuterol 4 puffs every 4-6 hours as needed or albuterol nebulizer one vial every 4-6 hours as needed ?- Asthma control goals:  ?* Full participation in all desired activities (may need albuterol before activity) ?* Albuterol use two time or less a week on average (not counting use with activity) ?* Cough interfering with sleep two time or less a month ?* Oral steroids no more than once a year ?* No hospitalizations ? ?2. Chronic rhinitis (grasses, weeds, trees, indoor molds, outdoor molds, dust mites, dog and cockroach) ?- I think you are doing very well.  ?- We are not going to make any changes at this point. ?- Continue taking: Zyrtec (cetirizine) 10mg  tablet once 1-2 times daily and Dymista (fluticasone/azelastine) two sprays per nostril 1-2 times daily ?- You can consider changing up your antihistamines if needed, but the cetirizine seems to be working well.  ?- I would avoid taking the cetirizine at the same time as you are doing your mucinex (they counteract each other).  ?- Consider allergy shots for long term control.  ? ?3. Return in about 6 months (around 07/31/2022).  ? ? ?Please inform us of any Emergency Department visits, hospitalizations, or changes in symptoms. Call us before going to the ED for breathing or allergy symptoms since we might be able to fit you in for a sick visit. Feel free to contact us anytime with any questions, problems, or concerns. ? ?It was a pleasure to see you today! ? ?Websites that have reliable patient information: ?1. American Academy of Asthma, Allergy, and Immunology: www.aaaai.org ?2. Food Allergy Research and Education  (FARE): foodallergy.org ?3. Mothers of Asthmatics: http://www.asthmacommunitynetwork.org ?4. [employer] of Allergy, Asthma, and Immunology: MissingWeapons.cawww.acaai.org ? ? ?COVID-19 Vaccine Information can be found at: [email or web address] For questions related to vaccine distribution or appointments, please email [email or web address] or call [phone].  ? ?We realize that you might be concerned about having an allergic reaction to the COVID19 vaccines. To help with that concern, WE ARE OFFERING THE COVID19 VACCINES IN OUR OFFICE! Ask the front desk for dates!  ? ? ? ??Like? us on Facebook and Instagram for our latest updates!  ?  ? ? ?A healthy democracy works best when [employer] voters participate! Make sure you are registered to vote! If you have moved or changed any of your contact information, you will need to get this updated before voting! ? ?In some cases, you MAY be able to register to vote online: [email or web address] ? ? ? ? ? ? ? ? ? ?

## 2022-01-28 NOTE — Progress Notes (Signed)
? ?FOLLOW UP ? ?Date of Service/Encounter:  01/28/22 ? ? ?Assessment:  ? ?Moderate persistent asthma, uncomplicated ?  ?Seasonal and perennial allergic rhinitis (grasses, weeds, trees, indoor molds, outdoor molds, dust mites, dog and cockroach) ?  ?Rheumatoid arthritis - on Humira, MTX, and folic acid ? ?Plan/Recommendations:  ? ?1. Moderate persistent asthma, uncomplicated ?- Lung testing looks phenomenal today. ?- We will continue with the same medication regimen since this is working so well. ?- Daily controller medication(s): Breo 100/31mcg one puff once daily ?- Prior to physical activity: albuterol 2 puffs 10-15 minutes before physical activity. ?- Rescue medications: albuterol 4 puffs every 4-6 hours as needed or albuterol nebulizer one vial every 4-6 hours as needed ?- Asthma control goals:  ?* Full participation in all desired activities (may need albuterol before activity) ?* Albuterol use two time or less a week on average (not counting use with activity) ?* Cough interfering with sleep two time or less a month ?* Oral steroids no more than once a year ?* No hospitalizations ? ?2. Chronic rhinitis (grasses, weeds, trees, indoor molds, outdoor molds, dust mites, dog and cockroach) ?- I think you are doing very well.  ?- We are not going to make any changes at this point. ?- Continue taking: Zyrtec (cetirizine) 10mg  tablet once 1-2 times daily and Dymista (fluticasone/azelastine) two sprays per nostril 1-2 times daily ?- You can consider changing up your antihistamines if needed, but the cetirizine seems to be working well.  ?- I would avoid taking the cetirizine at the same time as you are doing your mucinex (they counteract each other).  ?- Consider allergy shots for long term control.  ? ?3. Return in about 6 months (around 07/31/2022).  ? ? ?Subjective:  ? ?Laura Allen is a 71 y.o. female presenting today for follow up of  ?Chief Complaint  ?Patient presents with  ? Asthma  ?  Some wheezing  shortness of breath coughing   ? Allergic Rhinitis   ?  Coughing, some runny nose and watery eyes   ? ? ?Laura Allen has a history of the following: ?Patient Active Problem List  ? Diagnosis Date Noted  ? Left knee DJD 11/25/2016  ? Multinodular goiter 03/19/2016  ? Primary hypothyroidism 09/11/2015  ? Status post total right knee replacement 05/20/2015  ? Rheumatic joint disease ([place]) 12/03/2011  ? Primary osteoarthritis of left knee 12/03/2011  ? ? ?History obtained from: chart review and patient. ? ?Laura Allen is a 71 y.o. female presenting for a follow up visit.  She was last seen in September 2022.  At that time, her lung testing looked phenomenal.  We continue with Breo 100 mcg 1 puff once daily and albuterol as needed.  For her allergic rhinitis, we continue with Zyrtec and Dymista. ? ?Since last visit, she has largely done very well.  ? ?Asthma/Respiratory Symptom History: She has Breo one puff once daily throughout the year. She does have more coughing and wheezing at night and uses her albuterol occasionally. Janan's asthma has been well controlled. She has not required rescue medication, experienced nocturnal awakenings due to lower respiratory symptoms, nor have activities of daily living been limited. She has required no Emergency Department or Urgent Care visits for her asthma. She has required zero courses of systemic steroids for asthma exacerbations since the last visit. ACT score today is 25, indicating excellent asthma symptom control.  ? ? ?Allergic Rhinitis Symptom History: Her allergic rhinitis symptoms have been acting up since  the pollen started up. She reports a lot of drainage. She will take the mucous tablet occasionally and will help to get some of this out. She was taking a cetirizine as well. She is using Dymista aggressively.  Fall and spring are the worse times of the year. She is not interested in allergy shots at this point in time. ? ?She has not needed cortisone shots at all  for anything since she saw me.  She remains on Humira and methotrexate as well as folic acid for her rheumatoid arthritis.  This is working very well. ? ?She had a fairly good birthday.  Her mother made her fried chicken and all of the fixings. She also had a coconut cake. Her brother recently passed away in October 08, 2023, who was her mother's primary caregiver.  This has been quite the transition. ? ?Otherwise, there have been no changes to her past medical history, surgical history, family history, or social history. ? ? ? ?Review of Systems  ?Constitutional: Negative.  Negative for fever, malaise/fatigue and weight loss.  ?HENT: Negative.  Negative for congestion, ear discharge and ear pain.   ?Eyes:  Negative for pain, discharge and redness.  ?Respiratory:  Negative for cough, sputum production, shortness of breath and wheezing.   ?Cardiovascular: Negative.  Negative for chest pain and palpitations.  ?Gastrointestinal:  Negative for abdominal pain, heartburn, nausea and vomiting.  ?Skin: Negative.  Negative for itching and rash.  ?Neurological:  Negative for dizziness and headaches.  ?Endo/Heme/Allergies:  Negative for environmental allergies. Does not bruise/bleed easily.   ? ? ? ?Objective:  ? ?Blood pressure 134/86, pulse 98, temperature (!) 97.3 ?F (36.3 ?C), resp. rate 16, height 5\' 7"  (1.702 m), weight 239 lb 3.2 oz (108.5 kg), SpO2 96 %. ?Body mass index is 37.46 kg/m?. ? ? ? ?Physical Exam ?Vitals reviewed.  ?Constitutional:   ?   Appearance: She is well-developed.  ?   Comments: Very delightful.  ?HENT:  ?   Head: Normocephalic and atraumatic.  ?   Right Ear: Tympanic membrane, ear canal and external ear normal.  ?   Left Ear: Tympanic membrane, ear canal and external ear normal.  ?   Nose: No nasal deformity, septal deviation, mucosal edema or rhinorrhea.  ?   Right Turbinates: Enlarged, swollen and pale.  ?   Left Turbinates: Enlarged, swollen and pale.  ?   Right Sinus: No maxillary sinus tenderness or  frontal sinus tenderness.  ?   Left Sinus: No maxillary sinus tenderness or frontal sinus tenderness.  ?   Mouth/Throat:  ?   Mouth: Mucous membranes are not pale and not dry.  ?   Pharynx: Uvula midline.  ?   Comments: Mild cobblestoning. ?Eyes:  ?   General: Lids are normal. No allergic shiner.    ?   Right eye: No discharge.     ?   Left eye: No discharge.  ?   Conjunctiva/sclera: Conjunctivae normal.  ?   Right eye: Right conjunctiva is not injected. No chemosis. ?   Left eye: Left conjunctiva is not injected. No chemosis. ?   Pupils: Pupils are equal, round, and reactive to light.  ?Cardiovascular:  ?   Rate and Rhythm: Normal rate and regular rhythm.  ?   Heart sounds: Normal heart sounds.  ?Pulmonary:  ?   Effort: Pulmonary effort is normal. No tachypnea, accessory muscle usage or respiratory distress.  ?   Breath sounds: Normal breath sounds. No wheezing, rhonchi or rales.  ?  Comments: Moving air well in all lung fields. No increased work of breathing noted.  ?Chest:  ?   Chest wall: No tenderness.  ?Lymphadenopathy:  ?   Cervical: No cervical adenopathy.  ?Skin: ?   General: Skin is warm.  ?   Capillary Refill: Capillary refill takes less than 2 seconds.  ?   Coloration: Skin is not pale.  ?   Findings: No abrasion, erythema, petechiae or rash. Rash is not papular, urticarial or vesicular.  ?   Comments: No eczematous or urticarial lesions noted.   ?Neurological:  ?   Mental Status: She is alert.  ?Psychiatric:     ?   Behavior: Behavior is cooperative.  ?  ? ?Diagnostic studies:   ? ?Spirometry: results normal (FEV1: 2.37/98%, FVC: 3.37/107%, FEV1/FVC: 70%).  ?  ?Spirometry consistent with normal pattern. ? ?Allergy Studies: none ? ? ? ? ? ?  ?Salvatore Marvel, MD  ?[facility] [place] [facility] [place] ? ? ? ? ? ? ?

## 2022-01-29 ENCOUNTER — Encounter: Payer: Self-pay | Admitting: Allergy & Immunology

## 2022-01-29 MED ORDER — FLUTICASONE FUROATE-VILANTEROL 100-25 MCG/ACT IN AEPB: 1.0000 | INHALATION_SPRAY | Freq: Every day | RESPIRATORY_TRACT | 5 refills | Status: DC

## 2022-01-29 MED ORDER — ALBUTEROL SULFATE HFA 108 (90 BASE) MCG/ACT IN AERS: 2.0000 | INHALATION_SPRAY | RESPIRATORY_TRACT | 1 refills | Status: DC | PRN

## 2022-03-23 DIAGNOSIS — J069 Acute upper respiratory infection, unspecified: Secondary | ICD-10-CM | POA: Diagnosis not present

## 2022-03-23 DIAGNOSIS — E063 Autoimmune thyroiditis: Secondary | ICD-10-CM | POA: Diagnosis not present

## 2022-03-23 DIAGNOSIS — R590 Localized enlarged lymph nodes: Secondary | ICD-10-CM | POA: Diagnosis not present

## 2022-03-23 DIAGNOSIS — E559 Vitamin D deficiency, unspecified: Secondary | ICD-10-CM | POA: Diagnosis not present

## 2022-04-23 DIAGNOSIS — M0579 Rheumatoid arthritis with rheumatoid factor of multiple sites without organ or systems involvement: Secondary | ICD-10-CM | POA: Diagnosis not present

## 2022-04-23 DIAGNOSIS — Z79899 Other long term (current) drug therapy: Secondary | ICD-10-CM | POA: Diagnosis not present

## 2022-05-10 ENCOUNTER — Other Ambulatory Visit: Payer: Self-pay | Admitting: Allergy & Immunology

## 2022-05-18 DIAGNOSIS — M810 Age-related osteoporosis without current pathological fracture: Secondary | ICD-10-CM | POA: Diagnosis not present

## 2022-06-22 ENCOUNTER — Other Ambulatory Visit: Payer: Self-pay | Admitting: Allergy & Immunology

## 2022-07-28 DIAGNOSIS — M1991 Primary osteoarthritis, unspecified site: Secondary | ICD-10-CM | POA: Diagnosis not present

## 2022-07-28 DIAGNOSIS — M810 Age-related osteoporosis without current pathological fracture: Secondary | ICD-10-CM | POA: Diagnosis not present

## 2022-07-28 DIAGNOSIS — R5383 Other fatigue: Secondary | ICD-10-CM | POA: Diagnosis not present

## 2022-07-28 DIAGNOSIS — Z79899 Other long term (current) drug therapy: Secondary | ICD-10-CM | POA: Diagnosis not present

## 2022-07-28 DIAGNOSIS — M0579 Rheumatoid arthritis with rheumatoid factor of multiple sites without organ or systems involvement: Secondary | ICD-10-CM | POA: Diagnosis not present

## 2022-07-31 ENCOUNTER — Ambulatory Visit: Payer: Medicare Other | Admitting: Allergy & Immunology

## 2022-07-31 ENCOUNTER — Encounter: Payer: Self-pay | Admitting: Allergy & Immunology

## 2022-07-31 VITALS — BP 116/82 | HR 101

## 2022-07-31 DIAGNOSIS — J3089 Other allergic rhinitis: Secondary | ICD-10-CM | POA: Diagnosis not present

## 2022-07-31 DIAGNOSIS — J4540 Moderate persistent asthma, uncomplicated: Secondary | ICD-10-CM

## 2022-07-31 DIAGNOSIS — J302 Other seasonal allergic rhinitis: Secondary | ICD-10-CM

## 2022-07-31 DIAGNOSIS — J0100 Acute maxillary sinusitis, unspecified: Secondary | ICD-10-CM | POA: Diagnosis not present

## 2022-07-31 MED ORDER — AMOXICILLIN-POT CLAVULANATE 875-125 MG PO TABS: 1.0000 | ORAL_TABLET | Freq: Two times a day (BID) | ORAL | 0 refills | Status: AC

## 2022-07-31 MED ORDER — BREO ELLIPTA 100-25 MCG/ACT IN AEPB: 1.0000 | INHALATION_SPRAY | Freq: Every day | RESPIRATORY_TRACT | 5 refills | Status: DC

## 2022-07-31 MED ORDER — CETIRIZINE HCL 10 MG PO TABS: 10.0000 mg | ORAL_TABLET | Freq: Every day | ORAL | 3 refills | Status: DC

## 2022-07-31 MED ORDER — AZELASTINE-FLUTICASONE 137-50 MCG/ACT NA SUSP: 2.0000 | Freq: Two times a day (BID) | NASAL | 5 refills | Status: DC | PRN

## 2022-07-31 MED ORDER — ALBUTEROL SULFATE HFA 108 (90 BASE) MCG/ACT IN AERS: 2.0000 | INHALATION_SPRAY | RESPIRATORY_TRACT | 1 refills | Status: DC | PRN

## 2022-07-31 NOTE — Progress Notes (Signed)
FOLLOW UP  Date of Service/Encounter:  07/31/22   Assessment:   Moderate persistent asthma, uncomplicated   Seasonal and perennial allergic rhinitis (grasses, weeds, trees, indoor molds, outdoor molds, dust mites, dog and cockroach)   Rheumatoid arthritis - on Humira, MTX, and folic acid  Plan/Recommendations:   1. Moderate persistent asthma, uncomplicated - Lung testing looks phenomenal today. - We will continue with the same medication regimen since this is working so well. - Daily controller medication(s): Breo 100/22mcg one puff once daily - Prior to physical activity: albuterol 2 puffs 10-15 minutes before physical activity. - Rescue medications: albuterol 4 puffs every 4-6 hours as needed or albuterol nebulizer one vial every 4-6 hours as needed - Asthma control goals:  * Full participation in all desired activities (may need albuterol before activity) * Albuterol use two time or less a week on average (not counting use with activity) * Cough interfering with sleep two time or less a month * Oral steroids no more than once a year * No hospitalizations  2. Chronic rhinitis (grasses, weeds, trees, indoor molds, outdoor molds, dust mites, dog and cockroach) - I think you are doing very well.  - We are not going to make any changes at this point. - Continue taking: Zyrtec (cetirizine) 10mg  tablet once 1-2 times daily and Dymista (fluticasone/azelastine) two sprays per nostril 1-2 times daily - You can consider changing up your antihistamines if needed, but the cetirizine seems to be working well.  - I would avoid taking the cetirizine at the same time as you are doing your mucinex (they counteract each other).  - Consider allergy shots for long term control.  - CPT codes provided. - This could treat your allergic rhinitis symptoms AND your asthma.  - Do consider this!   3. Acute sinusitis - With your current symptoms and time course, antibiotics are needed: Augmentin  875mg  twice daily for 7 days - If symptoms are not improving in 1-2 days, start prednisone pack provided.  - For thick post nasal drainage, add guaifenesin [phone] mg (Mucinex) twice daily as needed for mucous thinning with adequate hydration to help it work.   4. Return in about 6 months (around 01/29/2023).   Subjective:   Laura Allen is a 71 y.o. female presenting today for follow up of  Chief Complaint  Patient presents with   Follow-up    Laura Allen has a history of the following: Patient Active Problem List   Diagnosis Date Noted   Left knee DJD 11/25/2016   Multinodular goiter 03/19/2016   Primary hypothyroidism 09/11/2015   Status post total right knee replacement 05/20/2015   Rheumatic joint disease ([place]) 12/03/2011   Primary osteoarthritis of left knee 12/03/2011    History obtained from: chart review and patient.  Laura Allen is a 71 y.o. female presenting for a follow up visit.  I last saw her in March 2023.  At that time, her lung testing looked amazing.  We continue with Breo 100 mcg 1 puff once daily as well as albuterol as needed.  For her rhinitis, she was doing very well with Zyrtec as well as Dymista  Since the last visit, she has done well. She is disappointed in the swimming scene. She went when she could, but it was rainy in June and then it got cold so quickly this year, so she did not get to go as much as she could have.   Asthma/Respiratory Symptom History: She remains on the [facility]  one puff once daily.  This seems to be controlling her symptoms very effectively. Audree's asthma has been well controlled. She has not required rescue medication, experienced nocturnal awakenings due to lower respiratory symptoms, nor have activities of daily living been limited. She has required no Emergency Department or Urgent Care visits for her asthma. She has required zero courses of systemic steroids for asthma exacerbations since the last visit. ACT score today is  21, indicating excellent asthma symptom control. She does have some current wheezing with her sinus infection, but otherwise she has done very well.   Allergic Rhinitis Symptom History: She has congestion and postnasal drip today. It has been going on for a couple of weeks and it has been relapsing and remitting. She reports that it changes from yellow consistencies and whatnot. She is using her Dymista. It has not cleared up much at all since this started.  She had had an antibiotic back in May from Dr. Phillips Odor. She normal gets antibiotics around twice annually. Typically, she can keep it under control with her medications. She has never been on allergy shots, but she has thought about it. She already gets injections with her arthritis and Humira and she does not really like injections.   Otherwise, there have been no changes to her past medical history, surgical history, family history, or social history.    Review of Systems  Constitutional: Negative.  Negative for fever, malaise/fatigue and weight loss.  HENT: Negative.  Negative for congestion, ear discharge and ear pain.   Eyes:  Negative for pain, discharge and redness.  Respiratory:  Negative for cough, sputum production, shortness of breath and wheezing.   Cardiovascular: Negative.  Negative for chest pain and palpitations.  Gastrointestinal:  Negative for abdominal pain, constipation, diarrhea, heartburn, nausea and vomiting.  Skin: Negative.  Negative for itching and rash.  Neurological:  Negative for dizziness and headaches.  Endo/Heme/Allergies:  Negative for environmental allergies. Does not bruise/bleed easily.       Objective:   Blood pressure 116/82, pulse (!) 101, SpO2 96 %. There is no height or weight on file to calculate BMI.    Physical Exam Vitals reviewed.  Constitutional:      Appearance: She is well-developed.     Comments: Very delightful. Talkative.   HENT:     Head: Normocephalic and atraumatic.      Right Ear: Tympanic membrane, ear canal and external ear normal.     Left Ear: Tympanic membrane, ear canal and external ear normal.     Nose: Rhinorrhea present. No nasal deformity, septal deviation or mucosal edema.     Right Turbinates: Enlarged, swollen and pale.     Left Turbinates: Enlarged, swollen and pale.     Right Sinus: Maxillary sinus tenderness present. No frontal sinus tenderness.     Left Sinus: Maxillary sinus tenderness present. No frontal sinus tenderness.     Mouth/Throat:     Mouth: Mucous membranes are not pale and not dry.     Pharynx: Uvula midline.     Comments: Mild cobblestoning. Eyes:     General: Lids are normal. No allergic shiner.       Right eye: No discharge.        Left eye: No discharge.     Conjunctiva/sclera: Conjunctivae normal.     Right eye: Right conjunctiva is not injected. No chemosis.    Left eye: Left conjunctiva is not injected. No chemosis.    Pupils: Pupils are equal, round, and  reactive to light.  Cardiovascular:     Rate and Rhythm: Normal rate and regular rhythm.     Heart sounds: Normal heart sounds.  Pulmonary:     Effort: Pulmonary effort is normal. No tachypnea, accessory muscle usage or respiratory distress.     Breath sounds: Normal breath sounds. No wheezing, rhonchi or rales.     Comments: Moving air well in all lung fields. No increased work of breathing noted.  Chest:     Chest wall: No tenderness.  Lymphadenopathy:     Cervical: No cervical adenopathy.  Skin:    General: Skin is warm.     Capillary Refill: Capillary refill takes less than 2 seconds.     Coloration: Skin is not pale.     Findings: No abrasion, erythema, petechiae or rash. Rash is not papular, urticarial or vesicular.     Comments: No eczematous or urticarial lesions noted.   Neurological:     Mental Status: She is alert.  Psychiatric:        Behavior: Behavior is cooperative.      Diagnostic studies:    Spirometry: results normal (FEV1:  1.91/79%, FVC: 2.84/90%, FEV1/FVC: 67%).    Spirometry consistent with normal pattern.   Allergy Studies: none       Malachi Bonds, MD  [facility]

## 2022-07-31 NOTE — Patient Instructions (Addendum)
1. Moderate persistent asthma, uncomplicated - Lung testing looks phenomenal today. - We will continue with the same medication regimen since this is working so well. - Daily controller medication(s): Breo 100/49mcg one puff once daily - Prior to physical activity: albuterol 2 puffs 10-15 minutes before physical activity. - Rescue medications: albuterol 4 puffs every 4-6 hours as needed or albuterol nebulizer one vial every 4-6 hours as needed - Asthma control goals:  * Full participation in all desired activities (may need albuterol before activity) * Albuterol use two time or less a week on average (not counting use with activity) * Cough interfering with sleep two time or less a month * Oral steroids no more than once a year * No hospitalizations  2. Chronic rhinitis (grasses, weeds, trees, indoor molds, outdoor molds, dust mites, dog and cockroach) - I think you are doing very well.  - We are not going to make any changes at this point. - Continue taking: Zyrtec (cetirizine)  tablet once 1-2 times daily and Dymista (fluticasone/azelastine) two sprays per nostril 1-2 times daily - You can consider changing up your antihistamines if needed, but the cetirizine seems to be working well.  - I would avoid taking the cetirizine at the same time as you are doing your mucinex (they counteract each other).  - Consider allergy shots for long term control.  - CPT codes provided. - This could treat your allergic rhinitis symptoms AND your asthma.  - Do consider this!   3. Acute sinusitis - With your current symptoms and time course, antibiotics are needed: Augmentin  twice daily for 7 days - If symptoms are not improving in 1-2 days, start prednisone pack provided.  - For thick post nasal drainage, add guaifenesin [phone] mg (Mucinex) twice daily as needed for mucous thinning with adequate hydration to help it work.   4. Return in about 6 months (around 01/29/2023).    Please inform  us of any Emergency Department visits, hospitalizations, or changes in symptoms. Call us before going to the ED for breathing or allergy symptoms since we might be able to fit you in for a sick visit. Feel free to contact us anytime with any questions, problems, or concerns.  It was a pleasure to see you today!  Websites that have reliable patient information: 1. American Academy of Asthma, Allergy, and Immunology: www.aaaai.org 2. Food Allergy Research and Education (FARE): foodallergy.org 3. Mothers of Asthmatics: http://www.asthmacommunitynetwork.org 4. American College of Allergy, Asthma, and Immunology: www.acaai.org   COVID-19 Vaccine Information can be found at: [email or web address] For questions related to vaccine distribution or appointments, please email [email or web address] or call [phone].   We realize that you might be concerned about having an allergic reaction to the COVID19 vaccines. To help with that concern, WE ARE OFFERING THE COVID19 VACCINES IN OUR OFFICE! Ask the front desk for dates!     "Like" [place] on Facebook and Instagram for our latest updates!      A healthy democracy works best when [employer] participate! Make sure you are registered to vote! If you have moved or changed any of your contact information, you will need to get this updated before voting!  In some cases, you MAY be able to register to vote online: [email or web address]    Allergy Shots   Allergies are the result of a chain reaction that starts in the immune system. Your immune system controls how your body defends itself. For instance, if you have an allergy to pollen, your immune system  identifies pollen as an invader or allergen. Your immune system overreacts by producing antibodies called Immunoglobulin E (IgE). These antibodies travel to cells that release chemicals, causing an allergic reaction.  The  concept behind allergy immunotherapy, whether it is received in the form of shots or tablets, is that the immune system can be desensitized to specific allergens that trigger allergy symptoms. Although it requires time and patience, the payback can be long-term relief.  How Do Allergy Shots Work?  Allergy shots work much like a vaccine. Your body responds to injected amounts of a particular allergen given in increasing doses, eventually developing a resistance and tolerance to it. Allergy shots can lead to decreased, minimal or no allergy symptoms.  There generally are two phases: build-up and maintenance. Build-up often ranges from three to six months and involves receiving injections with increasing amounts of the allergens. The shots are typically given once or twice a week, though more rapid build-up schedules are sometimes used.  The maintenance phase begins when the most effective dose is reached. This dose is different for each person, depending on how allergic you are and your response to the build-up injections. Once the maintenance dose is reached, there are longer periods between injections, typically two to four weeks.  Occasionally doctors give cortisone-type shots that can temporarily reduce allergy symptoms. These types of shots are different and should not be confused with allergy immunotherapy shots.  Who Can Be Treated with Allergy Shots?  Allergy shots may be a good treatment approach for people with allergic rhinitis (hay fever), allergic asthma, conjunctivitis (eye allergy) or stinging insect allergy.   Before deciding to begin allergy shots, you should consider:   The length of allergy season and the severity of your symptoms  Whether medications and/or changes to your environment can control your symptoms  Your desire to avoid long-term medication use  Time: allergy immunotherapy requires a major time commitment  Cost: may vary depending on your insurance  coverage  Allergy shots for children age 88 and older are effective and often well tolerated. They might prevent the onset of new allergen sensitivities or the progression to asthma.  Allergy shots are not started on patients who are pregnant but can be continued on patients who become pregnant while receiving them. In some patients with other medical conditions or who take certain common medications, allergy shots may be of risk. It is important to mention other medications you talk to your allergist.   When Will I Feel Better?  Some may experience decreased allergy symptoms during the build-up phase. For others, it may take as long as 12 months on the maintenance dose. If there is no improvement after a year of maintenance, your allergist will discuss other treatment options with you.  If you aren't responding to allergy shots, it may be because there is not enough dose of the allergen in your vaccine or there are missing allergens that were not identified during your allergy testing. Other reasons could be that there are high levels of the allergen in your environment or major exposure to non-allergic triggers like tobacco smoke.  What Is the Length of Treatment?  Once the maintenance dose is reached, allergy shots are generally continued for three to five years. The decision to stop should be discussed with your allergist at that time. Some people may experience a permanent reduction of allergy symptoms. Others may relapse and a longer course of allergy shots can be considered.  What Are the Possible Reactions?  The two types of adverse reactions that can occur with allergy shots are local and systemic. Common local reactions include very mild redness and swelling at the injection site, which can happen immediately or several hours after. A systemic reaction, which is less common, affects the entire body or a particular body system. They are usually mild and typically respond quickly to  medications. Signs include increased allergy symptoms such as sneezing, a stuffy nose or hives.  Rarely, a serious systemic reaction called anaphylaxis can develop. Symptoms include swelling in the throat, wheezing, a feeling of tightness in the chest, nausea or dizziness. Most serious systemic reactions develop within 30 minutes of allergy shots. This is why it is strongly recommended you wait in your doctor's office for 30 minutes after your injections. Your allergist is trained to watch for reactions, and his or her staff is trained and equipped with the proper medications to identify and treat them.  Who Should Administer Allergy Shots?  The preferred location for receiving shots is your prescribing allergist's office. Injections can sometimes be given at another facility where the physician and staff are trained to recognize and treat reactions, and have received instructions by your prescribing allergist.

## 2022-09-23 ENCOUNTER — Telehealth: Payer: Self-pay | Admitting: Nurse Practitioner

## 2022-09-23 DIAGNOSIS — E039 Hypothyroidism, unspecified: Secondary | ICD-10-CM

## 2022-09-23 NOTE — Telephone Encounter (Signed)
done

## 2022-09-23 NOTE — Telephone Encounter (Signed)
Do you mind updating her labs?

## 2022-09-28 DIAGNOSIS — J069 Acute upper respiratory infection, unspecified: Secondary | ICD-10-CM | POA: Diagnosis not present

## 2022-09-28 DIAGNOSIS — Z6835 Body mass index (BMI) 35.0-35.9, adult: Secondary | ICD-10-CM | POA: Diagnosis not present

## 2022-09-28 DIAGNOSIS — J302 Other seasonal allergic rhinitis: Secondary | ICD-10-CM | POA: Diagnosis not present

## 2022-09-28 DIAGNOSIS — E6609 Other obesity due to excess calories: Secondary | ICD-10-CM | POA: Diagnosis not present

## 2022-09-28 DIAGNOSIS — E039 Hypothyroidism, unspecified: Secondary | ICD-10-CM | POA: Diagnosis not present

## 2022-09-29 LAB — TSH: TSH: 1.53 u[IU]/mL (ref 0.450–4.500)

## 2022-09-29 LAB — T4, FREE: Free T4: 1.88 ng/dL — ABNORMAL HIGH (ref 0.82–1.77)

## 2022-10-05 ENCOUNTER — Ambulatory Visit (INDEPENDENT_AMBULATORY_CARE_PROVIDER_SITE_OTHER): Payer: Medicare Other | Admitting: Nurse Practitioner

## 2022-10-05 ENCOUNTER — Encounter: Payer: Self-pay | Admitting: Nurse Practitioner

## 2022-10-05 VITALS — BP 109/78 | HR 103 | Ht 67.0 in | Wt 241.2 lb

## 2022-10-05 DIAGNOSIS — E042 Nontoxic multinodular goiter: Secondary | ICD-10-CM | POA: Diagnosis not present

## 2022-10-05 DIAGNOSIS — E039 Hypothyroidism, unspecified: Secondary | ICD-10-CM | POA: Diagnosis not present

## 2022-10-05 MED ORDER — LEVOTHYROXINE SODIUM 100 MCG PO TABS: ORAL_TABLET | ORAL | 3 refills | Status: DC

## 2022-10-05 NOTE — Progress Notes (Signed)
10/05/2022       Endocrinology Follow Up Visit  ---------------------------------------------------------------------------------------------------------------------------- SUBJECTIVE:  Past Medical History:  Diagnosis Date   Anemia    PMH   Asthma    Bronchitis    PMH   Family history of adverse reaction to anesthesia    mother gets PONV   H/O exercise stress test    [facility]   H/O seasonal allergies    Hearing aid worn    B/L   History of underactive thyroid    Hypertension    Hypothyroidism    Osteoarthritis (arthritis due to wear and tear of joints)    Pancreatitis    Rheumatoid arthritis(714.0)    Sleep apnea    mild; does not wear CPAP   Wears glasses     Past Surgical History:  Procedure Laterality Date   CATARACT EXTRACTION W/PHACO Left 09/04/2013   Procedure: CATARACT EXTRACTION PHACO AND INTRAOCULAR LENS PLACEMENT (IOC);  Surgeon: Gemma Payor, MD;  Location: [facility];  Service: Ophthalmology;  Laterality: Left;  CDE:  10.22   CATARACT EXTRACTION W/PHACO Right 09/14/2013   Procedure: CATARACT EXTRACTION PHACO AND INTRAOCULAR LENS PLACEMENT (IOC);  Surgeon: Gemma Payor, MD;  Location: [facility];  Service: Ophthalmology;  Laterality: Right;  CDE:8.81   CHOLECYSTECTOMY     COLONOSCOPY     COLONOSCOPY N/A 06/24/2018   Procedure: COLONOSCOPY;  Surgeon: Corbin Ade, MD;  Location: [facility] SUITE;  Service: Endoscopy;  Laterality: N/A;  9:45   KNEE ARTHROPLASTY Right 05/20/2015   Procedure: COMPUTER ASSISTED TOTAL KNEE ARTHROPLASTY;  Surgeon: Eldred Manges, MD;  Location: [facility] OR;  Service: Orthopedics;  Laterality: Right;   OVARIAN CYST SURGERY     TOTAL KNEE ARTHROPLASTY Left 11/25/2016   Procedure: LEFT TOTAL KNEE ARTHROPLASTY;  Surgeon: Eldred Manges, MD;  Location: [facility] OR;  Service: Orthopedics;  Laterality: Left;    Thyroid Problem Presents for follow-up (She reports to have been diagnosed with hypothyroidism at age 89 yrs  and has been on various doses  of thyroid hormone ever since, until it was stabilized at 100 g by mouth daily.) visit. Patient reports no anxiety, cold intolerance, constipation, depressed mood, diarrhea, fatigue, heat intolerance, palpitations, tremors, weight gain or weight loss. The symptoms have been stable.    Laura Allen is a 71 y.o.-year-old female, is being engaged in follow-up of hypothyroidism, and nodular goiter.   She is on levothyroxine 100 mcg p.o. daily before breakfast.  She is compliant. No new complaints.  Her weight stayed steady since last visit.  She denies cold intolerance, heat intolerance. No h/o radiation tx to head or neck.   -She was found to have mildly suspicious 1 cm nodule on the left lobe of her thyroid, attempt to biopsy did not yield diagnostic material.  She has no complaints of dysphagia, voice change nor shortness of breath.  Her previsit thyroid ultrasound is unremarkable.   ROS:  Review of systems  Constitutional: + Minimally fluctuating body weight,  current Body mass index is 37.78 kg/m. , no fatigue, no subjective hyperthermia, no subjective hypothermia Eyes: no blurry vision, no xerophthalmia ENT: no sore throat, no nodules palpated in throat, no dysphagia/odynophagia, no hoarseness Cardiovascular: no chest pain, no shortness of breath, no palpitations, no leg swelling Respiratory: no cough, no shortness of breath Gastrointestinal: no nausea/vomiting/diarrhea Musculoskeletal: no muscle/joint aches Skin: no rashes, no hyperemia Neurological: no tremors, no numbness, no tingling, no dizziness Psychiatric: no depression, no anxiety  ---------------------------------------------------------------------------------------------------------------------------- OBJECTIVE:  BP  109/78 (BP Location: Right Arm, Patient Position: Sitting, Cuff Size: Large)   Pulse (!) 103   Ht 5\' 7"  (1.702 m)   Wt 241 lb 3.2 oz (109.4 kg)   BMI 37.78 kg/m  Wt Readings from Last 3 Encounters:   10/05/22 241 lb 3.2 oz (109.4 kg)  01/28/22 239 lb 3.2 oz (108.5 kg)  10/01/21 238 lb 9.6 oz (108.2 kg)   BP Readings from Last 3 Encounters:  10/05/22 109/78  07/31/22 116/82  01/28/22 134/86     Physical Exam- Limited  Constitutional:  Body mass index is 37.78 kg/m. , not in acute distress, normal state of mind Eyes:  EOMI, no exophthalmos Neck: Supple Thyroid: No gross goiter Cardiovascular: mildly tachycardic, no murmurs, rubs, or gallops, no edema Respiratory: Adequate breathing efforts, no crackles, rales, rhonchi, or wheezing Musculoskeletal: no gross deformities, strength intact in all four extremities, no gross restriction of joint movements Skin:  no rashes, no hyperemia Neurological: slight tremor with outstretched hands     Latest Reference Range & Units 09/15/17 08:53 09/26/18 09:28 03/22/19 08:42 09/22/19 09:44 09/25/21 09:49 09/28/22 11:05  TSH 0.450 - 4.500 uIU/mL 1.97 2.59 1.42 1.44 2.420 1.530  T4,Free(Direct) 0.82 - 1.77 ng/dL 1.4 1.3 1.6 1.7 09/30/22 5.63 (H)  (H): Data is abnormally high  ---------------------------------------------------------------------------------------------------------------------------- ASSESSMENT / PLAN: 1. Hypothyroidism  Patient with long-standing hypothyroidism, on levothyroxine therapy.   -Her previsit TFTs are consistent with slight over-replacement and she is slightly tarchycardic today.  She is advised to continue Levothyroxine 100 mcg po daily before breakfast but to skip 1 day out of the week (same day each week).   - We discussed about the correct intake of her thyroid hormone, on empty stomach at fasting, with water, separated by at least 30 minutes from breakfast and other medications,  and separated by more than 4 hours from calcium, iron, multivitamins, acid reflux medications (PPIs). -Patient is made aware of the fact that thyroid hormone replacement is needed for life, dose to be adjusted by periodic monitoring of  thyroid function tests.  2. Multinodular goiter -  Her follow-up ultrasound of the thyroid from 06/05/20 shows stable findings, unremarkable and does not require dedicated follow up or surveillance moving forward.    I spent 25 minutes in the care of the patient today including review of labs from Thyroid Function, CMP, and other relevant labs ; imaging/biopsy records (current and previous including abstractions from other facilities); face-to-face time discussing  her lab results and symptoms, medications doses, her options of short and long term treatment based on the latest standards of care / guidelines;   and documenting the encounter.  06/07/20  participated in the discussions, expressed understanding, and voiced agreement with the above plans.  All questions were answered to her satisfaction. she is encouraged to contact clinic should she have any questions or concerns prior to her return visit.     Follow Up Plan: Return in about 1 year (around 10/06/2023) for Thyroid follow up, Previsit labs.    10/08/2023, [facility] [address] [place], [place] Phone: [phone] Fax: [phone]   10/05/2022, 10:30 AM

## 2022-10-05 NOTE — Patient Instructions (Signed)

## 2022-10-27 ENCOUNTER — Other Ambulatory Visit: Payer: Self-pay | Admitting: Rheumatology

## 2022-10-27 DIAGNOSIS — M810 Age-related osteoporosis without current pathological fracture: Secondary | ICD-10-CM

## 2022-10-27 DIAGNOSIS — M0579 Rheumatoid arthritis with rheumatoid factor of multiple sites without organ or systems involvement: Secondary | ICD-10-CM | POA: Diagnosis not present

## 2022-10-29 DIAGNOSIS — E559 Vitamin D deficiency, unspecified: Secondary | ICD-10-CM | POA: Diagnosis not present

## 2022-10-29 DIAGNOSIS — Z6835 Body mass index (BMI) 35.0-35.9, adult: Secondary | ICD-10-CM | POA: Diagnosis not present

## 2022-10-29 DIAGNOSIS — Z1331 Encounter for screening for depression: Secondary | ICD-10-CM | POA: Diagnosis not present

## 2022-10-29 DIAGNOSIS — Z0001 Encounter for general adult medical examination with abnormal findings: Secondary | ICD-10-CM | POA: Diagnosis not present

## 2022-10-29 DIAGNOSIS — E063 Autoimmune thyroiditis: Secondary | ICD-10-CM | POA: Diagnosis not present

## 2022-10-29 DIAGNOSIS — M069 Rheumatoid arthritis, unspecified: Secondary | ICD-10-CM | POA: Diagnosis not present

## 2022-11-04 ENCOUNTER — Other Ambulatory Visit (HOSPITAL_COMMUNITY): Payer: Self-pay | Admitting: Rheumatology

## 2022-11-04 DIAGNOSIS — M810 Age-related osteoporosis without current pathological fracture: Secondary | ICD-10-CM

## 2022-11-13 ENCOUNTER — Ambulatory Visit (HOSPITAL_COMMUNITY)
Admission: RE | Admit: 2022-11-13 | Discharge: 2022-11-13 | Disposition: A | Discharge status: Home / Self Care | Payer: Medicare Other | Source: Ambulatory Visit | Attending: Rheumatology | Admitting: Rheumatology

## 2022-11-13 DIAGNOSIS — M810 Age-related osteoporosis without current pathological fracture: Secondary | ICD-10-CM | POA: Diagnosis not present

## 2022-11-19 DIAGNOSIS — M810 Age-related osteoporosis without current pathological fracture: Secondary | ICD-10-CM | POA: Diagnosis not present

## 2022-12-03 DIAGNOSIS — M069 Rheumatoid arthritis, unspecified: Secondary | ICD-10-CM | POA: Diagnosis not present

## 2022-12-03 DIAGNOSIS — Z6835 Body mass index (BMI) 35.0-35.9, adult: Secondary | ICD-10-CM | POA: Diagnosis not present

## 2022-12-03 DIAGNOSIS — E6609 Other obesity due to excess calories: Secondary | ICD-10-CM | POA: Diagnosis not present

## 2022-12-03 DIAGNOSIS — J069 Acute upper respiratory infection, unspecified: Secondary | ICD-10-CM | POA: Diagnosis not present

## 2022-12-03 DIAGNOSIS — J45909 Unspecified asthma, uncomplicated: Secondary | ICD-10-CM | POA: Diagnosis not present

## 2022-12-03 DIAGNOSIS — U071 COVID-19: Secondary | ICD-10-CM | POA: Diagnosis not present

## 2023-01-28 DIAGNOSIS — M810 Age-related osteoporosis without current pathological fracture: Secondary | ICD-10-CM | POA: Diagnosis not present

## 2023-01-28 DIAGNOSIS — M0579 Rheumatoid arthritis with rheumatoid factor of multiple sites without organ or systems involvement: Secondary | ICD-10-CM | POA: Diagnosis not present

## 2023-01-28 DIAGNOSIS — Z79899 Other long term (current) drug therapy: Secondary | ICD-10-CM | POA: Diagnosis not present

## 2023-01-28 DIAGNOSIS — M1991 Primary osteoarthritis, unspecified site: Secondary | ICD-10-CM | POA: Diagnosis not present

## 2023-01-29 ENCOUNTER — Ambulatory Visit (INDEPENDENT_AMBULATORY_CARE_PROVIDER_SITE_OTHER): Payer: BC Managed Care – PPO | Admitting: Allergy & Immunology

## 2023-01-29 ENCOUNTER — Encounter: Payer: Self-pay | Admitting: Allergy & Immunology

## 2023-01-29 ENCOUNTER — Other Ambulatory Visit: Payer: Self-pay

## 2023-01-29 VITALS — BP 118/72 | HR 100 | Temp 98.0°F | Resp 20 | Ht 67.0 in | Wt 244.0 lb

## 2023-01-29 DIAGNOSIS — J4540 Moderate persistent asthma, uncomplicated: Secondary | ICD-10-CM

## 2023-01-29 DIAGNOSIS — J3089 Other allergic rhinitis: Secondary | ICD-10-CM

## 2023-01-29 DIAGNOSIS — J0100 Acute maxillary sinusitis, unspecified: Secondary | ICD-10-CM | POA: Diagnosis not present

## 2023-01-29 MED ORDER — BREO ELLIPTA 100-25 MCG/ACT IN AEPB: 1.0000 | INHALATION_SPRAY | Freq: Every day | RESPIRATORY_TRACT | 5 refills | Status: DC

## 2023-01-29 MED ORDER — CETIRIZINE HCL 10 MG PO TABS: 10.0000 mg | ORAL_TABLET | Freq: Every day | ORAL | 3 refills | Status: DC

## 2023-01-29 MED ORDER — AZELASTINE-FLUTICASONE 137-50 MCG/ACT NA SUSP: 2.0000 | Freq: Two times a day (BID) | NASAL | 5 refills | Status: DC | PRN

## 2023-01-29 MED ORDER — ALBUTEROL SULFATE HFA 108 (90 BASE) MCG/ACT IN AERS: 2.0000 | INHALATION_SPRAY | RESPIRATORY_TRACT | 1 refills | Status: DC | PRN

## 2023-01-29 NOTE — Progress Notes (Signed)
FOLLOW UP  Date of Service/Encounter:  01/29/23   Assessment:   Moderate persistent asthma, uncomplicated   Seasonal and perennial allergic rhinitis (grasses, weeds, trees, indoor molds, outdoor molds, dust mites, dog and cockroach)   Rheumatoid arthritis - on Humira, MTX, and folic acid  Plan/Recommendations:   1. Moderate persistent asthma, uncomplicated - Lung testing looks phenomenal today. - We are not going to make any medication changes.  - Daily controller medication(s): Breo 100/88mcg one puff once daily - Prior to physical activity: albuterol 2 puffs 10-15 minutes before physical activity. - Rescue medications: albuterol 4 puffs every 4-6 hours as needed or albuterol nebulizer one vial every 4-6 hours as needed - Asthma control goals:  * Full participation in all desired activities (may need albuterol before activity) * Albuterol use two time or less a week on average (not counting use with activity) * Cough interfering with sleep two time or less a month * Oral steroids no more than once a year * No hospitalizations  2. Chronic rhinitis (grasses, weeds, trees, indoor molds, outdoor molds, dust mites, dog and cockroach) - I think you are doing very well.  - We are not going to make any changes at this point. - Continue taking: Zyrtec (cetirizine) 10mg  tablet once 1-2 times daily and Dymista (fluticasone/azelastine) two sprays per nostril 1-2 times daily - We can do allergy shots in the future if needed.   3. Return in about 6 months (around 08/01/2023).    Subjective:   Laura Allen is a 72 y.o. female presenting today for follow up of  Chief Complaint  Patient presents with   Follow-up    Pt states she has been doing good and her asthma is good.    Laura Allen has a history of the following: Patient Active Problem List   Diagnosis Date Noted   Left knee DJD 11/25/2016   Multinodular goiter 03/19/2016   Primary hypothyroidism 09/11/2015    Status post total right knee replacement 05/20/2015   Rheumatic joint disease ([place]) 12/03/2011   Primary osteoarthritis of left knee 12/03/2011    History obtained from: chart review and patient.  Laura Allen is a 72 y.o. female presenting for a follow up visit.  She was last seen and September 2023.  At that time, her lung testing looked great.  We continue with Breo 100 mcg 1 puff once daily as well as albuterol as needed.  For her rhinitis, we continue with Zyrtec as well as Dymista.  We did talk about allergen immunotherapy.  We did treat her with Augmentin for 7 days for acute sinusitis and added on Mucinex.  Since last visit, she has done very well. She did have COVID in January 2024. She got a steroid injection and a Z pack because she was too late for the Paxlovid. She had an intense headache. Everyone else in the household got it and her husband got the [place]. Laura Allen gave her some breathing treatments if she sounds bad as well. Her husband has COPD and he had COVID worse than them.   Asthma/Respiratory Symptom History: She remains on the Breo one puff once daily which seems to be working well for her asthma symptoms. She has the albuterol including the nbulizer and MDI, which has worked. This Memory Dance seems to be doing the trick. She has not used the albuterol aside from the episode of COVID. She mowed the yard recently and had a great time with that.   Allergic Rhinitis Symptom  History: She has not had a problem with her runny nose. She is doing the cetirizine which seems to work well. She has the nose spray (Dymista) which is approved for her.  She does not feel that she needs allergy shots at this point in time.   She remains on the MTX 20mg  once weekly. She is also on Humira every other week. She injects them both herself. She has been stable on this regimen for a while. She sees Dr. Trudie Reed and has been stable on this regimen for ages.   Otherwise, there have been no changes to her past  medical history, surgical history, family history, or social history.    Review of Systems  Constitutional: Negative.  Negative for chills, fever, malaise/fatigue and weight loss.  HENT: Negative.  Negative for congestion, ear discharge and ear pain.   Eyes:  Negative for pain, discharge and redness.  Respiratory:  Negative for cough, sputum production, shortness of breath and wheezing.   Cardiovascular: Negative.  Negative for chest pain and palpitations.  Gastrointestinal:  Negative for abdominal pain, constipation, diarrhea, heartburn, nausea and vomiting.  Skin: Negative.  Negative for itching and rash.  Neurological:  Negative for dizziness and headaches.  Endo/Heme/Allergies:  Negative for environmental allergies. Does not bruise/bleed easily.       Objective:   Blood pressure 118/72, pulse 100, temperature 98 F (36.7 C), resp. rate 20, height 5\' 7"  (1.702 m), weight 244 lb (110.7 kg), SpO2 96 %. Body mass index is 38.22 kg/m.    Physical Exam Vitals reviewed.  Constitutional:      Appearance: She is well-developed.     Comments: Very delightful. Talkative.   HENT:     Head: Normocephalic and atraumatic.     Right Ear: Tympanic membrane, ear canal and external ear normal.     Left Ear: Tympanic membrane, ear canal and external ear normal.     Nose: No nasal deformity, septal deviation, mucosal edema or rhinorrhea.     Right Turbinates: Enlarged, swollen and pale.     Left Turbinates: Enlarged, swollen and pale.     Right Sinus: No maxillary sinus tenderness or frontal sinus tenderness.     Left Sinus: No maxillary sinus tenderness or frontal sinus tenderness.     Mouth/Throat:     Mouth: Mucous membranes are not pale and not dry.     Pharynx: Uvula midline.     Comments: Mild cobblestoning. Eyes:     General: Lids are normal. No allergic shiner.       Right eye: No discharge.        Left eye: No discharge.     Conjunctiva/sclera: Conjunctivae normal.     Right  eye: Right conjunctiva is not injected. No chemosis.    Left eye: Left conjunctiva is not injected. No chemosis.    Pupils: Pupils are equal, round, and reactive to light.  Cardiovascular:     Rate and Rhythm: Normal rate and regular rhythm.     Heart sounds: Normal heart sounds.  Pulmonary:     Effort: Pulmonary effort is normal. No tachypnea, accessory muscle usage or respiratory distress.     Breath sounds: Normal breath sounds. No wheezing, rhonchi or rales.     Comments: Moving air well in all lung fields. No increased work of breathing noted.  Chest:     Chest wall: No tenderness.  Lymphadenopathy:     Cervical: No cervical adenopathy.  Skin:    General: Skin is warm.  Capillary Refill: Capillary refill takes less than 2 seconds.     Coloration: Skin is not pale.     Findings: No abrasion, erythema, petechiae or rash. Rash is not papular, urticarial or vesicular.     Comments: No eczematous or urticarial lesions noted.   Neurological:     Mental Status: She is alert.  Psychiatric:        Behavior: Behavior is cooperative.      Diagnostic studies:  none       Salvatore Marvel, MD  [facility]

## 2023-01-29 NOTE — Patient Instructions (Addendum)
1. Moderate persistent asthma, uncomplicated - Lung testing looks phenomenal today. - We are not going to make any medication changes.  - Daily controller medication(s): Breo 100/47mcg one puff once daily - Prior to physical activity: albuterol 2 puffs 10-15 minutes before physical activity. - Rescue medications: albuterol 4 puffs every 4-6 hours as needed or albuterol nebulizer one vial every 4-6 hours as needed - Asthma control goals:  * Full participation in all desired activities (may need albuterol before activity) * Albuterol use two time or less a week on average (not counting use with activity) * Cough interfering with sleep two time or less a month * Oral steroids no more than once a year * No hospitalizations  2. Chronic rhinitis (grasses, weeds, trees, indoor molds, outdoor molds, dust mites, dog and cockroach) - I think you are doing very well.  - We are not going to make any changes at this point. - Continue taking: Zyrtec (cetirizine) 10mg  tablet once 1-2 times daily and Dymista (fluticasone/azelastine) two sprays per nostril 1-2 times daily - We can do allergy shots in the future if needed.   3. Return in about 6 months (around 08/01/2023).    Please inform us of any Emergency Department visits, hospitalizations, or changes in symptoms. Call us before going to the ED for breathing or allergy symptoms since we might be able to fit you in for a sick visit. Feel free to contact us anytime with any questions, problems, or concerns.  It was a pleasure to see you today!  Websites that have reliable patient information: 1. American Academy of Asthma, Allergy, and Immunology: www.aaaai.org 2. Food Allergy Research and Education (FARE): foodallergy.org 3. Mothers of Asthmatics: http://www.asthmacommunitynetwork.org 4. American College of Allergy, Asthma, and Immunology: www.acaai.org   COVID-19 Vaccine Information can be found at:  [email or web address] For questions related to vaccine distribution or appointments, please email [email or web address] or call [phone].   We realize that you might be concerned about having an allergic reaction to the COVID19 vaccines. To help with that concern, WE ARE OFFERING THE COVID19 VACCINES IN OUR OFFICE! Ask the front desk for dates!     "Like" [place] on Facebook and Instagram for our latest updates!      A healthy democracy works best when [employer] participate! Make sure you are registered to vote! If you have moved or changed any of your contact information, you will need to get this updated before voting!  In some cases, you MAY be able to register to vote online: [email or web address]

## 2023-04-21 DIAGNOSIS — E1159 Type 2 diabetes mellitus with other circulatory complications: Secondary | ICD-10-CM | POA: Diagnosis not present

## 2023-04-21 DIAGNOSIS — Z6835 Body mass index (BMI) 35.0-35.9, adult: Secondary | ICD-10-CM | POA: Diagnosis not present

## 2023-04-21 DIAGNOSIS — I10 Essential (primary) hypertension: Secondary | ICD-10-CM | POA: Diagnosis not present

## 2023-04-21 DIAGNOSIS — E6609 Other obesity due to excess calories: Secondary | ICD-10-CM | POA: Diagnosis not present

## 2023-04-21 DIAGNOSIS — R197 Diarrhea, unspecified: Secondary | ICD-10-CM | POA: Diagnosis not present

## 2023-04-21 DIAGNOSIS — M069 Rheumatoid arthritis, unspecified: Secondary | ICD-10-CM | POA: Diagnosis not present

## 2023-04-22 DIAGNOSIS — R197 Diarrhea, unspecified: Secondary | ICD-10-CM | POA: Diagnosis not present

## 2023-04-29 DIAGNOSIS — M0579 Rheumatoid arthritis with rheumatoid factor of multiple sites without organ or systems involvement: Secondary | ICD-10-CM | POA: Diagnosis not present

## 2023-05-24 DIAGNOSIS — M810 Age-related osteoporosis without current pathological fracture: Secondary | ICD-10-CM | POA: Diagnosis not present

## 2023-07-29 DIAGNOSIS — M0579 Rheumatoid arthritis with rheumatoid factor of multiple sites without organ or systems involvement: Secondary | ICD-10-CM | POA: Diagnosis not present

## 2023-07-29 DIAGNOSIS — Z79899 Other long term (current) drug therapy: Secondary | ICD-10-CM | POA: Diagnosis not present

## 2023-07-29 DIAGNOSIS — R5383 Other fatigue: Secondary | ICD-10-CM | POA: Diagnosis not present

## 2023-07-29 DIAGNOSIS — M1991 Primary osteoarthritis, unspecified site: Secondary | ICD-10-CM | POA: Diagnosis not present

## 2023-07-29 DIAGNOSIS — M810 Age-related osteoporosis without current pathological fracture: Secondary | ICD-10-CM | POA: Diagnosis not present

## 2023-08-04 ENCOUNTER — Other Ambulatory Visit: Payer: Self-pay | Admitting: Allergy & Immunology

## 2023-08-04 ENCOUNTER — Encounter: Payer: Self-pay | Admitting: Allergy & Immunology

## 2023-08-04 ENCOUNTER — Ambulatory Visit (INDEPENDENT_AMBULATORY_CARE_PROVIDER_SITE_OTHER): Payer: Medicare Other | Admitting: Allergy & Immunology

## 2023-08-04 ENCOUNTER — Other Ambulatory Visit: Payer: Self-pay

## 2023-08-04 VITALS — BP 120/86 | HR 115 | Temp 98.2°F | Ht 67.0 in | Wt 241.8 lb

## 2023-08-04 DIAGNOSIS — D849 Immunodeficiency, unspecified: Secondary | ICD-10-CM | POA: Diagnosis not present

## 2023-08-04 DIAGNOSIS — J3089 Other allergic rhinitis: Secondary | ICD-10-CM

## 2023-08-04 DIAGNOSIS — J302 Other seasonal allergic rhinitis: Secondary | ICD-10-CM | POA: Diagnosis not present

## 2023-08-04 DIAGNOSIS — J4540 Moderate persistent asthma, uncomplicated: Secondary | ICD-10-CM

## 2023-08-04 MED ORDER — AZELASTINE-FLUTICASONE 137-50 MCG/ACT NA SUSP: 2.0000 | Freq: Two times a day (BID) | NASAL | 5 refills | Status: DC | PRN

## 2023-08-04 MED ORDER — CETIRIZINE HCL 10 MG PO TABS: 10.0000 mg | ORAL_TABLET | Freq: Every day | ORAL | 1 refills | Status: DC

## 2023-08-04 MED ORDER — BREO ELLIPTA 100-25 MCG/ACT IN AEPB: 1.0000 | INHALATION_SPRAY | Freq: Every day | RESPIRATORY_TRACT | 1 refills | Status: DC

## 2023-08-04 NOTE — Progress Notes (Signed)
FOLLOW UP  Date of Service/Encounter:  08/04/23   Assessment:   Moderate persistent asthma, uncomplicated   Seasonal and perennial allergic rhinitis (grasses, weeds, trees, indoor molds, outdoor molds, dust mites, dog and cockroach)   Rheumatoid arthritis - on Humira, MTX, and folic acid  Plan/Recommendations:   1. Moderate persistent asthma, uncomplicated - Lung testing looks phenomenal today. - We are not going to make any medication changes.  - Daily controller medication(s): Breo 100/72mcg one puff once daily - Prior to physical activity: albuterol 2 puffs 10-15 minutes before physical activity. - Rescue medications: albuterol 4 puffs every 4-6 hours as needed or albuterol nebulizer one vial every 4-6 hours as needed - Asthma control goals:  * Full participation in all desired activities (may need albuterol before activity) * Albuterol use two time or less a week on average (not counting use with activity) * Cough interfering with sleep two time or less a month * Oral steroids no more than once a year * No hospitalizations  2. Chronic rhinitis (grasses, weeds, trees, indoor molds, outdoor molds, dust mites, dog and cockroach) - I think you are doing very well.  - We are not going to make any changes at this point. - Continue taking: Zyrtec (cetirizine) 10mg  tablet once 1-2 times daily and Dymista (fluticasone/azelastine) two sprays per nostril 1-2 times daily - We can do allergy shots in the future if needed.   3. Return in about 6 months (around 02/01/2024).   Subjective:   Laura Allen is a 72 y.o. female presenting today for follow up of  Chief Complaint  Patient presents with   Follow-up    No concerns    Laura Allen has a history of the following: Patient Active Problem List   Diagnosis Date Noted   Left knee DJD 11/25/2016   Multinodular goiter 03/19/2016   Primary hypothyroidism 09/11/2015   Status post total right knee replacement  05/20/2015   Rheumatic joint disease ([facility]) 12/03/2011   Primary osteoarthritis of left knee 12/03/2011    History obtained from: chart review and patient.  Laura Allen is a 71 y.o. female presenting for a follow up visit.  She was seen in March 2024.  At that time, her lung testing looked amazing.  We continue with Breo 100 mcg 1 puff once daily as well as albuterol as needed.  For her rhinitis, we continue with Zyrtec as well as Dymista.  We did talk about allergy shots if needed in the future.  Since the last visit, she has done well.   Asthma/Respiratory Symptom History: She remains on the Breo one puff once daily.  This is working. Laura Allen's asthma has been well controlled. She has not required rescue medication, experienced nocturnal awakenings due to lower respiratory symptoms, nor have activities of daily living been limited. She has required no Emergency Department or Urgent Care visits for her asthma. She has required zero courses of systemic steroids for asthma exacerbations since the last visit. ACT score today is 25, indicating excellent asthma symptom control.   Allergic Rhinitis Symptom History: She has been switching from Mucinex to Zyrec. She has been keeping her symptoms at bay. She always has issues during this time of the year.  She has not been on antibiotics. She does have the nose sprays to use as needed. She has overall been doing very well.   Her RA is in remission. She stopped her Humira around 6 months ago and is on the MTX once weekly.  If she starts needing the Humira again and having more pain, she was restart it. But right now they are are going to just try this. She has been in remission in the past. But she has always had to restart it.  She spends a lot of time swimming in her above ground pool. This has been very helpful for her joints.   Otherwise, there have been no changes to her past medical history, surgical history, family history, or social  history.    Review of systems otherwise negative other than that mentioned in the HPI.    Objective:   Blood pressure 120/86, pulse (!) 115, temperature 98.2 F (36.8 C), height 5\' 7"  (1.702 m), weight 241 lb 12.8 oz (109.7 kg), SpO2 97%. Body mass index is 37.87 kg/m.    Physical Exam Vitals reviewed.  Constitutional:      Appearance: She is well-developed.     Comments: Very delightful. Talkative.   HENT:     Head: Normocephalic and atraumatic.     Right Ear: Tympanic membrane, ear canal and external ear normal.     Left Ear: Tympanic membrane, ear canal and external ear normal.     Nose: No nasal deformity, septal deviation, mucosal edema or rhinorrhea.     Right Turbinates: Enlarged, swollen and pale.     Left Turbinates: Enlarged, swollen and pale.     Right Sinus: No maxillary sinus tenderness or frontal sinus tenderness.     Left Sinus: No maxillary sinus tenderness or frontal sinus tenderness.     Mouth/Throat:     Mouth: Mucous membranes are not pale and not dry.     Pharynx: Uvula midline.     Comments: Mild cobblestoning. Eyes:     General: Lids are normal. No allergic shiner.       Right eye: No discharge.        Left eye: No discharge.     Conjunctiva/sclera: Conjunctivae normal.     Right eye: Right conjunctiva is not injected. No chemosis.    Left eye: Left conjunctiva is not injected. No chemosis.    Pupils: Pupils are equal, round, and reactive to light.  Cardiovascular:     Rate and Rhythm: Normal rate and regular rhythm.     Heart sounds: Normal heart sounds.  Pulmonary:     Effort: Pulmonary effort is normal. No tachypnea, accessory muscle usage or respiratory distress.     Breath sounds: Normal breath sounds. No wheezing, rhonchi or rales.     Comments: Moving air well in all lung fields. No increased work of breathing noted.  Chest:     Chest wall: No tenderness.  Lymphadenopathy:     Cervical: No cervical adenopathy.  Skin:    General:  Skin is warm.     Capillary Refill: Capillary refill takes less than 2 seconds.     Coloration: Skin is not pale.     Findings: No abrasion, erythema, petechiae or rash. Rash is not papular, urticarial or vesicular.     Comments: No eczematous or urticarial lesions noted.   Neurological:     Mental Status: She is alert.  Psychiatric:        Behavior: Behavior is cooperative.      Diagnostic studies:    Spirometry: results normal (FEV1: 2.83/118%, FVC: 3.66/117%, FEV1/FVC: 77%).    Spirometry consistent with normal pattern.   Allergy Studies: none        Malachi Bonds, MD  [facility]

## 2023-08-04 NOTE — Patient Instructions (Addendum)
1. Moderate persistent asthma, uncomplicated - Lung testing looks phenomenal today. - We are not going to make any medication changes.  - Daily controller medication(s): Breo 100/76mcg one puff once daily - Prior to physical activity: albuterol 2 puffs 10-15 minutes before physical activity. - Rescue medications: albuterol 4 puffs every 4-6 hours as needed or albuterol nebulizer one vial every 4-6 hours as needed - Asthma control goals:  * Full participation in all desired activities (may need albuterol before activity) * Albuterol use two time or less a week on average (not counting use with activity) * Cough interfering with sleep two time or less a month * Oral steroids no more than once a year * No hospitalizations  2. Chronic rhinitis (grasses, weeds, trees, indoor molds, outdoor molds, dust mites, dog and cockroach) - I think you are doing very well.  - We are not going to make any changes at this point. - Continue taking: Zyrtec (cetirizine) 10mg  tablet once 1-2 times daily and Dymista (fluticasone/azelastine) two sprays per nostril 1-2 times daily - We can do allergy shots in the future if needed.   3. Return in about 6 months (around 02/01/2024).    Please inform us of any Emergency Department visits, hospitalizations, or changes in symptoms. Call us before going to the ED for breathing or allergy symptoms since we might be able to fit you in for a sick visit. Feel free to contact us anytime with any questions, problems, or concerns.  It was a pleasure to see you again today!  Websites that have reliable patient information: 1. American Academy of Asthma, Allergy, and Immunology: www.aaaai.org 2. Food Allergy Research and Education (FARE): foodallergy.org 3. Mothers of Asthmatics: http://www.asthmacommunitynetwork.org 4. American College of Allergy, Asthma, and Immunology: www.acaai.org   COVID-19 Vaccine Information can be found at:  PodExchange.nl For questions related to vaccine distribution or appointments, please email [email or web address] or call [phone].     "Like" [place] on Facebook and Instagram for our latest updates!      A healthy democracy works best when [employer] participate! Make sure you are registered to vote! If you have moved or changed any of your contact information, you will need to get this updated before voting! Scan the QR codes below to learn more!

## 2023-08-05 ENCOUNTER — Other Ambulatory Visit (HOSPITAL_COMMUNITY): Payer: Self-pay

## 2023-08-05 NOTE — Telephone Encounter (Signed)
PA not needed-refill is too soon due to fill of 90 day supply on 08/30

## 2023-08-06 NOTE — Addendum Note (Signed)
Addended by: Philipp Deputy on: 08/06/2023 12:21 PM   Modules accepted: Orders

## 2023-09-22 ENCOUNTER — Telehealth: Payer: Self-pay | Admitting: Nurse Practitioner

## 2023-09-22 DIAGNOSIS — E039 Hypothyroidism, unspecified: Secondary | ICD-10-CM

## 2023-09-22 NOTE — Telephone Encounter (Signed)
ORDER UPDATED

## 2023-09-22 NOTE — Telephone Encounter (Signed)
Please update labs

## 2023-09-29 DIAGNOSIS — E039 Hypothyroidism, unspecified: Secondary | ICD-10-CM | POA: Diagnosis not present

## 2023-09-30 ENCOUNTER — Telehealth: Payer: Self-pay | Admitting: Pharmacy Technician

## 2023-09-30 ENCOUNTER — Other Ambulatory Visit: Payer: Self-pay

## 2023-09-30 DIAGNOSIS — M810 Age-related osteoporosis without current pathological fracture: Secondary | ICD-10-CM | POA: Insufficient documentation

## 2023-09-30 LAB — TSH: TSH: 3.9 u[IU]/mL (ref 0.450–4.500)

## 2023-09-30 LAB — T4, FREE: Free T4: 1.6 ng/dL (ref 0.82–1.77)

## 2023-09-30 NOTE — Telephone Encounter (Addendum)
Auth Submission: APPROVED Site of care: Site of care: [facility] Payer: BCBS MEDICARE & BCBS STATE Medication & CPT/J Code(s) submitted: Prolia (Denosumab) [phone] Route of submission (phone, fax, portal):  Phone # Fax # Auth type: Buy/Bill PB Units/visits requested: 2 Reference number: Jeff-D 10:45a Kierra-R 11:18a Approval from: 09/30/23 to 11/09/23  Primary BCBS MEDICARE: NO AUTH NEEDED   SECONDARY: BCBS STATE- APPROVED 10/18/23- 10/17/24 REF: [id number]

## 2023-10-04 NOTE — Patient Instructions (Signed)

## 2023-10-06 ENCOUNTER — Ambulatory Visit (INDEPENDENT_AMBULATORY_CARE_PROVIDER_SITE_OTHER): Payer: Medicare Other | Admitting: Nurse Practitioner

## 2023-10-06 ENCOUNTER — Encounter: Payer: Self-pay | Admitting: Nurse Practitioner

## 2023-10-06 VITALS — BP 119/86 | HR 116 | Ht 67.0 in | Wt 236.0 lb

## 2023-10-06 DIAGNOSIS — E039 Hypothyroidism, unspecified: Secondary | ICD-10-CM | POA: Diagnosis not present

## 2023-10-06 DIAGNOSIS — E042 Nontoxic multinodular goiter: Secondary | ICD-10-CM

## 2023-10-06 MED ORDER — LEVOTHYROXINE SODIUM 100 MCG PO TABS: ORAL_TABLET | ORAL | 3 refills | Status: DC

## 2023-10-06 NOTE — Progress Notes (Signed)
10/06/2023       Endocrinology Follow Up Visit  ---------------------------------------------------------------------------------------------------------------------------- SUBJECTIVE:  Past Medical History:  Diagnosis Date   Anemia    PMH   Asthma    Bronchitis    PMH   Family history of adverse reaction to anesthesia    mother gets PONV   H/O exercise stress test    [facility]   H/O seasonal allergies    Hearing aid worn    B/L   History of underactive thyroid    Hypertension    Hypothyroidism    Osteoarthritis (arthritis due to wear and tear of joints)    Pancreatitis    Rheumatoid arthritis(714.0)    Sleep apnea    mild; does not wear CPAP   Wears glasses     Past Surgical History:  Procedure Laterality Date   CATARACT EXTRACTION W/PHACO Left 09/04/2013   Procedure: CATARACT EXTRACTION PHACO AND INTRAOCULAR LENS PLACEMENT (IOC);  Surgeon: Gemma Payor, MD;  Location: [facility];  Service: Ophthalmology;  Laterality: Left;  CDE:  10.22   CATARACT EXTRACTION W/PHACO Right 09/14/2013   Procedure: CATARACT EXTRACTION PHACO AND INTRAOCULAR LENS PLACEMENT (IOC);  Surgeon: Gemma Payor, MD;  Location: [facility];  Service: Ophthalmology;  Laterality: Right;  CDE:8.81   CHOLECYSTECTOMY     COLONOSCOPY     COLONOSCOPY N/A 06/24/2018   Procedure: COLONOSCOPY;  Surgeon: Corbin Ade, MD;  Location: [facility];  Service: Endoscopy;  Laterality: N/A;  9:45   KNEE ARTHROPLASTY Right 05/20/2015   Procedure: COMPUTER ASSISTED TOTAL KNEE ARTHROPLASTY;  Surgeon: Eldred Manges, MD;  Location: [facility];  Service: Orthopedics;  Laterality: Right;   OVARIAN CYST SURGERY     TOTAL KNEE ARTHROPLASTY Left 11/25/2016   Procedure: LEFT TOTAL KNEE ARTHROPLASTY;  Surgeon: Eldred Manges, MD;  Location: [facility] OR;  Service: Orthopedics;  Laterality: Left;    Thyroid Problem Presents for follow-up (She reports to have been diagnosed with hypothyroidism at age 47 yrs  and has been on various doses  of thyroid hormone ever since, until it was stabilized at 100 g by mouth daily.) visit. Patient reports no anxiety, cold intolerance, constipation, depressed mood, diarrhea, fatigue, heat intolerance, palpitations, tremors, weight gain or weight loss. The symptoms have been stable.    Laura Allen is a 72 y.o.-year-old female, is being engaged in follow-up of hypothyroidism, and nodular goiter.   She is on levothyroxine 100 mcg p.o. daily before breakfast, skipping 1 day of the week.  She is compliant. No new complaints.  Her weight stayed steady since last visit- lost a few pounds by changing her diet some.  She denies cold intolerance, heat intolerance. No h/o radiation tx to head or neck.   -She was found to have mildly suspicious 1 cm nodule on the left lobe of her thyroid, attempt to biopsy did not yield diagnostic material.  She has no complaints of dysphagia, voice change nor shortness of breath.  Her previsit thyroid ultrasound is unremarkable.   ROS:  Review of systems  Constitutional: + slowly decreasing body weight,  current Body mass index is 36.96 kg/m. , no fatigue, no subjective hyperthermia, no subjective hypothermia Eyes: no blurry vision, no xerophthalmia ENT: no sore throat, no nodules palpated in throat, no dysphagia/odynophagia, no hoarseness Cardiovascular: no chest pain, no shortness of breath, no palpitations, no leg swelling Respiratory: no cough, no shortness of breath Gastrointestinal: no nausea/vomiting/diarrhea Musculoskeletal: no muscle/joint aches Skin: no rashes, no hyperemia Neurological: no tremors, no  numbness, no tingling, no dizziness Psychiatric: no depression, no anxiety  ---------------------------------------------------------------------------------------------------------------------------- OBJECTIVE:  BP 119/86 (BP Location: Left Arm, Patient Position: Sitting, Cuff Size: Large)   Pulse (!) 116   Ht 5\' 7"  (1.702 m)   Wt 236 lb (107  kg)   BMI 36.96 kg/m  Wt Readings from Last 3 Encounters:  10/06/23 236 lb (107 kg)  08/04/23 241 lb 12.8 oz (109.7 kg)  01/29/23 244 lb (110.7 kg)   BP Readings from Last 3 Encounters:  10/06/23 119/86  08/04/23 120/86  01/29/23 118/72     Physical Exam- Limited  Constitutional:  Body mass index is 36.96 kg/m. , not in acute distress, normal state of mind Eyes:  EOMI, no exophthalmos Musculoskeletal: no gross deformities, strength intact in all four extremities, no gross restriction of joint movements Skin:  no rashes, no hyperemia Neurological: no tremor with outstretched hands     Latest Reference Range & Units 09/26/18 09:28 03/22/19 08:42 09/22/19 09:44 09/25/21 09:49 09/28/22 11:05 09/29/23 09:58  TSH 0.450 - 4.500 uIU/mL 2.59 1.42 1.44 2.420 1.530 3.900  T4,Free(Direct) 0.82 - 1.77 ng/dL 1.3 1.6 1.7 1.61 0.96 (H) 1.60  (H): Data is abnormally high  ---------------------------------------------------------------------------------------------------------------------------- ASSESSMENT / PLAN: 1. Hypothyroidism   Patient with long-standing hypothyroidism, on levothyroxine therapy.   -Her previsit TFTs are consistent with appropriate hormone replacement.  She is advised to continue Levothyroxine 100 mcg po daily before breakfast skipping 1 day out of the week (same day each week).   - We discussed about the correct intake of her thyroid hormone, on empty stomach at fasting, with water, separated by at least 30 minutes from breakfast and other medications,  and separated by more than 4 hours from calcium, iron, multivitamins, acid reflux medications (PPIs). -Patient is made aware of the fact that thyroid hormone replacement is needed for life, dose to be adjusted by periodic monitoring of thyroid function tests.  2. Multinodular goiter -  Her follow-up ultrasound of the thyroid from 06/05/20 shows stable findings, unremarkable and does not require dedicated follow up or  surveillance moving forward.     I spent  15  minutes in the care of the patient today including review of labs from Thyroid Function, CMP, and other relevant labs ; imaging/biopsy records (current and previous including abstractions from other facilities); face-to-face time discussing  her lab results and symptoms, medications doses, her options of short and long term treatment based on the latest standards of care / guidelines;   and documenting the encounter.  [name]  participated in the discussions, expressed understanding, and voiced agreement with the above plans.  All questions were answered to her satisfaction. she is encouraged to contact clinic should she have any questions or concerns prior to her return visit.     Follow Up Plan: Return in about 1 year (around 10/05/2024) for Thyroid follow up, Previsit labs.    Ronny Bacon, [facility] [address] [place], [place] [address] Phone: [phone] Fax: [phone]   10/06/2023, 9:07 AM

## 2023-10-13 DIAGNOSIS — E6609 Other obesity due to excess calories: Secondary | ICD-10-CM | POA: Diagnosis not present

## 2023-10-13 DIAGNOSIS — Z0001 Encounter for general adult medical examination with abnormal findings: Secondary | ICD-10-CM | POA: Diagnosis not present

## 2023-10-13 DIAGNOSIS — M069 Rheumatoid arthritis, unspecified: Secondary | ICD-10-CM | POA: Diagnosis not present

## 2023-10-13 DIAGNOSIS — R7309 Other abnormal glucose: Secondary | ICD-10-CM | POA: Diagnosis not present

## 2023-10-13 DIAGNOSIS — E559 Vitamin D deficiency, unspecified: Secondary | ICD-10-CM | POA: Diagnosis not present

## 2023-10-13 DIAGNOSIS — E063 Autoimmune thyroiditis: Secondary | ICD-10-CM | POA: Diagnosis not present

## 2023-10-13 DIAGNOSIS — I10 Essential (primary) hypertension: Secondary | ICD-10-CM | POA: Diagnosis not present

## 2023-10-13 DIAGNOSIS — Z6834 Body mass index (BMI) 34.0-34.9, adult: Secondary | ICD-10-CM | POA: Diagnosis not present

## 2023-10-13 DIAGNOSIS — Z1331 Encounter for screening for depression: Secondary | ICD-10-CM | POA: Diagnosis not present

## 2023-10-18 ENCOUNTER — Encounter: Payer: Self-pay | Admitting: Rheumatology

## 2023-10-19 ENCOUNTER — Other Ambulatory Visit: Payer: Self-pay

## 2023-10-28 DIAGNOSIS — M0579 Rheumatoid arthritis with rheumatoid factor of multiple sites without organ or systems involvement: Secondary | ICD-10-CM | POA: Diagnosis not present

## 2023-11-08 ENCOUNTER — Encounter: Payer: Self-pay | Admitting: Rheumatology

## 2023-11-12 ENCOUNTER — Telehealth: Payer: Self-pay

## 2023-11-12 NOTE — Telephone Encounter (Signed)
 Auth Submission: NO AUTH NEEDED Site of care: Site of care: [facility] Payer: BCBS Medicare Medication & CPT/J Code(s) submitted: Prolia  (Denosumab ) [id number] Route of submission (phone, fax, portal): phone Phone # Fax # Auth type: Buy/Bill PB Units/visits requested: 60mg . q45months Reference number:  Approval from: 11/12/23 to 11/08/24   [facility] no longer active. Term ended 11/10/23

## 2023-11-16 ENCOUNTER — Encounter: Payer: Medicare Other | Attending: Rheumatology | Admitting: *Deleted

## 2023-11-16 VITALS — BP 125/88 | HR 123 | Temp 98.4°F | Resp 18

## 2023-11-16 DIAGNOSIS — M810 Age-related osteoporosis without current pathological fracture: Secondary | ICD-10-CM | POA: Diagnosis not present

## 2023-11-16 MED ORDER — DENOSUMAB 60 MG/ML ~~LOC~~ SOSY
60.0000 mg | PREFILLED_SYRINGE | Freq: Once | SUBCUTANEOUS | Status: AC
  Administered 2023-11-16: 60 mg via SUBCUTANEOUS

## 2023-11-16 NOTE — Progress Notes (Signed)
 Diagnosis: Osteoporosis  Provider:  Ishmael Slough MD  Procedure: Injection  Prolia  (Denosumab ), Dose: 60 mg, Site: subcutaneous, Number of injections: 1  Injection Site(s): Left arm  Post Care: Observation period completed  Discharge: Condition: Good, Destination: Home . AVS Provided  Performed by:  Baldwin Darice Helling, RN

## 2024-01-03 ENCOUNTER — Encounter: Payer: Self-pay | Admitting: Rheumatology

## 2024-01-27 DIAGNOSIS — Z79899 Other long term (current) drug therapy: Secondary | ICD-10-CM | POA: Diagnosis not present

## 2024-01-27 DIAGNOSIS — M1991 Primary osteoarthritis, unspecified site: Secondary | ICD-10-CM | POA: Diagnosis not present

## 2024-01-27 DIAGNOSIS — M810 Age-related osteoporosis without current pathological fracture: Secondary | ICD-10-CM | POA: Diagnosis not present

## 2024-01-27 DIAGNOSIS — M0579 Rheumatoid arthritis with rheumatoid factor of multiple sites without organ or systems involvement: Secondary | ICD-10-CM | POA: Diagnosis not present

## 2024-01-31 NOTE — Patient Instructions (Signed)
 Asthma Continue Breo 100-1 puff once a day to prevent cough or wheeze You may use albuterol 2 puffs once every 4 hours if needed for cough or wheeze  Allergic rhinitis Continue allergen avoidance measures directed toward grass pollen, weed pollen, tree pollen, mold, dust mite, dog, and cockroach as listed below Continue cetirizine 10 mg once a day if needed for runny nose or itch Continue Dymista 2 sprays in each nostril up to twice a day if needed for nasal symptoms Consider saline nasal rinses as needed for nasal symptoms. Use this before any medicated nasal sprays for best result  Call the clinic if this treatment plan is not working well for you  Follow up in 6 months or sooner if needed.  Reducing Pollen Exposure The American Academy of Allergy, Asthma and Immunology suggests the following steps to reduce your exposure to pollen during allergy seasons. Do not hang sheets or clothing out to dry; pollen may collect on these items. Do not mow lawns or spend time around freshly cut grass; mowing stirs up pollen. Keep windows closed at night.  Keep car windows closed while driving. Minimize morning activities outdoors, a time when pollen counts are usually at their highest. Stay indoors as much as possible when pollen counts or humidity is high and on windy days when pollen tends to remain in the air longer. Use air conditioning when possible.  Many air conditioners have filters that trap the pollen spores. Use a HEPA room air filter to remove pollen form the indoor air you breathe.  Control of Mold Allergen Mold and fungi can grow on a variety of surfaces provided certain temperature and moisture conditions exist.  Outdoor molds grow on plants, decaying vegetation and soil.  The major outdoor mold, Alternaria and Cladosporium, are found in very high numbers during hot and dry conditions.  Generally, a late Summer - Fall peak is seen for common outdoor fungal spores.  Rain will temporarily  lower outdoor mold spore count, but counts rise rapidly when the rainy period ends.  The most important indoor molds are Aspergillus and Penicillium.  Dark, humid and poorly ventilated basements are ideal sites for mold growth.  The next most common sites of mold growth are the bathroom and the kitchen.  Outdoor [employer] Use air conditioning and keep windows closed Avoid exposure to decaying vegetation. Avoid leaf raking. Avoid grain handling. Consider wearing a face mask if working in moldy areas.  Indoor Mold Control Maintain humidity below 50%. Clean washable surfaces with 5% bleach solution. Remove sources e.g. Contaminated carpets.   Control of Dust Mite Allergen Dust mites play a major role in allergic asthma and rhinitis. They occur in environments with high humidity wherever human skin is found. Dust mites absorb humidity from the atmosphere (ie, they do not drink) and feed on organic matter (including shed human and animal skin). Dust mites are a microscopic type of insect that you cannot see with the naked eye. High levels of dust mites have been detected from mattresses, pillows, carpets, upholstered furniture, bed covers, clothes, soft toys and any woven material. The principal allergen of the dust mite is found in its feces. A gram of dust may contain 1,000 mites and 250,000 fecal particles. Mite antigen is easily measured in the air during house cleaning activities. Dust mites do not bite and do not cause harm to humans, other than by triggering allergies/asthma.  Ways to decrease your exposure to dust mites in your home:  1. Encase  mattresses, box springs and pillows with a mite-impermeable barrier or cover  2. Wash sheets, blankets and drapes weekly in hot water (130 F) with detergent and dry them in a dryer on the hot setting.  3. Have the room cleaned frequently with a vacuum cleaner and a damp dust-mop. For carpeting or rugs, vacuuming with a vacuum cleaner equipped  with a high-efficiency particulate air (HEPA) filter. The dust mite allergic individual should not be in a room which is being cleaned and should wait 1 hour after cleaning before going into the room.  4. Do not sleep on upholstered furniture (eg, couches).  5. If possible removing carpeting, upholstered furniture and drapery from the home is ideal. Horizontal blinds should be eliminated in the rooms where the person spends the most time (bedroom, study, television room). Washable vinyl, roller-type shades are optimal.  6. Remove all non-washable stuffed toys from the bedroom. Wash stuffed toys weekly like sheets and blankets above.  7. Reduce indoor humidity to less than 50%. Inexpensive humidity monitors can be purchased at most hardware stores. Do not use a humidifier as can make the problem worse and are not recommended.  Control of Dog or Cat Allergen Avoidance is the best way to manage a dog or cat allergy. If you have a dog or cat and are allergic to dog or cats, consider removing the dog or cat from the home. If you have a dog or cat but don't want to find it a new home, or if your family wants a pet even though someone in the household is allergic, here are some strategies that may help keep symptoms at bay:  Keep the pet out of your bedroom and restrict it to only a few rooms. Be advised that keeping the dog or cat in only one room will not limit the allergens to that room. Don't pet, hug or kiss the dog or cat; if you do, wash your hands with soap and water. High-efficiency particulate air (HEPA) cleaners run continuously in a bedroom or living room can reduce allergen levels over time. Regular use of a high-efficiency vacuum cleaner or a central vacuum can reduce allergen levels. Giving your dog or cat a bath at least once a week can reduce airborne allergen.  Control of Cockroach Allergen Cockroach allergen has been identified as an important cause of acute attacks of asthma,  especially in urban settings.  There are fifty-five species of cockroach that exist in the [place], however only three, the [place], [place] species produce allergen that can affect patients with Asthma.  Allergens can be obtained from fecal particles, egg casings and secretions from cockroaches.    Remove food sources. Reduce access to water. Seal access and entry points. Spray runways with 0.5-1% Diazinon or Chlorpyrifos Blow boric acid power under stoves and refrigerator. Place bait stations (hydramethylnon) at feeding sites.

## 2024-01-31 NOTE — Progress Notes (Signed)
   [address] [place] [address] Dept: [phone]  FOLLOW UP NOTE  Patient ID: SHERROL VICARS, female    DOB: [birth year]  Age: 73 y.o. MRN: [id number] Date of Office Visit: 02/02/2024  Assessment  Chief Complaint: No chief complaint on file.  HPI ENYAH MOMAN is a 73 year old female who presents to the clinic for follow-up visit.  She was last seen in this clinic on 08/04/2023 by Dr. Dellis Anes for evaluation of asthma and allergic rhinitis.  Her last environmental allergy skin testing was on 11/23/2019 was positive to grass pollen, weed pollen, tree pollen, mold, dust mite, dog, and cockroach.  Discussed the use of AI scribe software for clinical note transcription with the patient, who gave verbal consent to proceed.  History of Present Illness             Drug Allergies:  Allergies  Allergen Reactions   Ibuprofen Swelling and Other (See Comments)    Can take in small doses.  Large doses cause swelling.    Levofloxacin     Other reaction(s): rash   Levofloxacin Hives and Rash   Streptomycin Hives and Rash    Other reaction(s): rash    Physical Exam: There were no vitals taken for this visit.   Physical Exam  Diagnostics:    Assessment and Plan: No diagnosis found.  No orders of the defined types were placed in this encounter.   There are no Patient Instructions on file for this visit.  No follow-ups on file.    Thank you for the opportunity to care for this patient.  Please do not hesitate to contact me with questions.  Thermon Leyland, FNP [facility]

## 2024-02-02 ENCOUNTER — Encounter: Payer: Self-pay | Admitting: Family Medicine

## 2024-02-02 ENCOUNTER — Ambulatory Visit (INDEPENDENT_AMBULATORY_CARE_PROVIDER_SITE_OTHER): Payer: Medicare Other | Admitting: Family Medicine

## 2024-02-02 VITALS — BP 100/66 | HR 118 | Temp 98.2°F | Resp 18 | Wt 225.2 lb

## 2024-02-02 DIAGNOSIS — J3089 Other allergic rhinitis: Secondary | ICD-10-CM

## 2024-02-02 DIAGNOSIS — J4540 Moderate persistent asthma, uncomplicated: Secondary | ICD-10-CM

## 2024-02-02 DIAGNOSIS — J302 Other seasonal allergic rhinitis: Secondary | ICD-10-CM | POA: Diagnosis not present

## 2024-02-02 MED ORDER — ALBUTEROL SULFATE HFA 108 (90 BASE) MCG/ACT IN AERS: 2.0000 | INHALATION_SPRAY | RESPIRATORY_TRACT | 1 refills | Status: AC | PRN

## 2024-02-02 MED ORDER — BREO ELLIPTA 100-25 MCG/ACT IN AEPB: 1.0000 | INHALATION_SPRAY | Freq: Every day | RESPIRATORY_TRACT | 5 refills | Status: DC

## 2024-02-02 MED ORDER — CETIRIZINE HCL 10 MG PO TABS: 10.0000 mg | ORAL_TABLET | Freq: Every day | ORAL | 1 refills | Status: DC

## 2024-02-02 MED ORDER — AZELASTINE-FLUTICASONE 137-50 MCG/ACT NA SUSP: 2.0000 | Freq: Two times a day (BID) | NASAL | 5 refills | Status: DC | PRN

## 2024-02-24 ENCOUNTER — Other Ambulatory Visit: Payer: Self-pay | Admitting: Nurse Practitioner

## 2024-03-03 DIAGNOSIS — E039 Hypothyroidism, unspecified: Secondary | ICD-10-CM | POA: Diagnosis not present

## 2024-03-03 DIAGNOSIS — H4311 Vitreous hemorrhage, right eye: Secondary | ICD-10-CM | POA: Diagnosis not present

## 2024-03-03 DIAGNOSIS — I10 Essential (primary) hypertension: Secondary | ICD-10-CM | POA: Diagnosis not present

## 2024-03-03 DIAGNOSIS — Z01818 Encounter for other preprocedural examination: Secondary | ICD-10-CM | POA: Diagnosis not present

## 2024-03-08 DIAGNOSIS — H353131 Nonexudative age-related macular degeneration, bilateral, early dry stage: Secondary | ICD-10-CM | POA: Diagnosis not present

## 2024-03-17 DIAGNOSIS — R55 Syncope and collapse: Secondary | ICD-10-CM | POA: Diagnosis not present

## 2024-04-05 DIAGNOSIS — H353131 Nonexudative age-related macular degeneration, bilateral, early dry stage: Secondary | ICD-10-CM | POA: Diagnosis not present

## 2024-04-16 ENCOUNTER — Encounter: Payer: Self-pay | Admitting: Rheumatology

## 2024-04-27 DIAGNOSIS — M0579 Rheumatoid arthritis with rheumatoid factor of multiple sites without organ or systems involvement: Secondary | ICD-10-CM | POA: Diagnosis not present

## 2024-05-16 ENCOUNTER — Encounter: Payer: Medicare Other | Attending: Rheumatology | Admitting: *Deleted

## 2024-05-16 VITALS — BP 125/82 | HR 96 | Temp 97.9°F | Resp 18

## 2024-05-16 DIAGNOSIS — M810 Age-related osteoporosis without current pathological fracture: Secondary | ICD-10-CM

## 2024-05-16 MED ORDER — DENOSUMAB 60 MG/ML ~~LOC~~ SOSY
60.0000 mg | PREFILLED_SYRINGE | Freq: Once | SUBCUTANEOUS | Status: AC
  Administered 2024-05-16: 60 mg via SUBCUTANEOUS

## 2024-05-16 NOTE — Progress Notes (Signed)
 Diagnosis: Osteoporosis  Provider:  Ishmael Slough MD  Procedure: Injection  Prolia  (Denosumab ), Dose: 60 mg, Site: subcutaneous, Number of injections: 1  Injection Site(s): Left arm  Post Care: Observation period completed  Discharge: Condition: Good, Destination: Home . AVS Declined  Performed by:  Baldwin Darice Helling, RN

## 2024-05-27 ENCOUNTER — Other Ambulatory Visit: Payer: Self-pay | Admitting: Nurse Practitioner

## 2024-06-07 DIAGNOSIS — H33312 Horseshoe tear of retina without detachment, left eye: Secondary | ICD-10-CM | POA: Diagnosis not present

## 2024-07-19 DIAGNOSIS — H33312 Horseshoe tear of retina without detachment, left eye: Secondary | ICD-10-CM | POA: Diagnosis not present

## 2024-07-27 DIAGNOSIS — M1991 Primary osteoarthritis, unspecified site: Secondary | ICD-10-CM | POA: Diagnosis not present

## 2024-07-27 DIAGNOSIS — Z79899 Other long term (current) drug therapy: Secondary | ICD-10-CM | POA: Diagnosis not present

## 2024-07-27 DIAGNOSIS — M0579 Rheumatoid arthritis with rheumatoid factor of multiple sites without organ or systems involvement: Secondary | ICD-10-CM | POA: Diagnosis not present

## 2024-08-04 ENCOUNTER — Other Ambulatory Visit: Payer: Self-pay

## 2024-08-04 ENCOUNTER — Encounter: Payer: Self-pay | Admitting: Allergy & Immunology

## 2024-08-04 ENCOUNTER — Ambulatory Visit (INDEPENDENT_AMBULATORY_CARE_PROVIDER_SITE_OTHER): Admitting: Allergy & Immunology

## 2024-08-04 VITALS — BP 118/72 | HR 118 | Temp 97.9°F | Resp 18 | Ht 65.75 in | Wt 221.2 lb

## 2024-08-04 DIAGNOSIS — J4540 Moderate persistent asthma, uncomplicated: Secondary | ICD-10-CM | POA: Diagnosis not present

## 2024-08-04 DIAGNOSIS — J3089 Other allergic rhinitis: Secondary | ICD-10-CM | POA: Diagnosis not present

## 2024-08-04 DIAGNOSIS — J0100 Acute maxillary sinusitis, unspecified: Secondary | ICD-10-CM | POA: Diagnosis not present

## 2024-08-04 DIAGNOSIS — J302 Other seasonal allergic rhinitis: Secondary | ICD-10-CM

## 2024-08-04 MED ORDER — BREO ELLIPTA 100-25 MCG/ACT IN AEPB: 1.0000 | INHALATION_SPRAY | Freq: Every day | RESPIRATORY_TRACT | 5 refills | Status: AC

## 2024-08-04 MED ORDER — AMOXICILLIN-POT CLAVULANATE 875-125 MG PO TABS: 1.0000 | ORAL_TABLET | Freq: Two times a day (BID) | ORAL | 0 refills | Status: AC

## 2024-08-04 MED ORDER — CETIRIZINE HCL 10 MG PO TABS: 10.0000 mg | ORAL_TABLET | Freq: Every day | ORAL | 1 refills | Status: AC

## 2024-08-04 MED ORDER — FLUCONAZOLE 150 MG PO TABS: 150.0000 mg | ORAL_TABLET | Freq: Once | ORAL | 0 refills | Status: AC

## 2024-08-04 MED ORDER — AZELASTINE-FLUTICASONE 137-50 MCG/ACT NA SUSP: 2.0000 | Freq: Two times a day (BID) | NASAL | 5 refills | Status: AC | PRN

## 2024-08-04 NOTE — Progress Notes (Signed)
 FOLLOW UP  Date of Service/Encounter:  08/04/24   Assessment:   Moderate persistent asthma, uncomplicated   Seasonal and perennial allergic rhinitis (grasses, weeds, trees, indoor molds, outdoor molds, dust mites, dog and cockroach)  Acute sinusitis - starting Augmentin    Rheumatoid arthritis - on MTX, and folic acid   Canner (pickles, tomatoes, cauliflower)  Plan/Recommendations:   1. Moderate persistent asthma, uncomplicated - Lung testing looks phenomenal today. - We are not going to make any medication changes.  - Everything seems to be working well to control your breathing.  - Daily controller medication(s): Breo 100/25mcg one puff once daily - Prior to physical activity: albuterol  2 puffs 10-15 minutes before physical activity. - Rescue medications: albuterol  4 puffs every 4-6 hours as needed or albuterol  nebulizer one vial every 4-6 hours as needed - Asthma control goals:  * Full participation in all desired activities (may need albuterol  before activity) * Albuterol  use two time or less a week on average (not counting use with activity) * Cough interfering with sleep two time or less a month * Oral steroids no more than once a year * No hospitalizations  2. Chronic rhinitis (grasses, weeds, trees, indoor molds, outdoor molds, dust mites, dog and cockroach) - I think you are doing very well.  - We are not going to make any changes at this point. - Continue taking: Zyrtec  (cetirizine ) 10mg  tablet once 1-2 times daily and Dymista  (fluticasone /azelastine ) two sprays per nostril 1-2 times daily - We can do allergy shots in the future if needed.  - This would cure you allergies, but it is a long time commitment.   3. Acute sinusitis  - Start Augmentin  twice daily for two weeks. - Diflucan  sent in to use if you develop a yeast infection. - Hopefully we can avoid needing prednisone since this is bad on your bones.   4. Return in about 6 months (around 02/01/2025).     Subjective:   Laura Allen is a 73 y.o. female presenting today for follow up of  Chief Complaint  Patient presents with   Follow-up    Stuffy nose , headaches     Laura Allen has a history of the following: Patient Active Problem List   Diagnosis Date Noted   OP (osteoporosis) 09/30/2023   Left knee DJD 11/25/2016   Multinodular goiter 03/19/2016   Primary hypothyroidism 09/11/2015   Status post total right knee replacement 05/20/2015   Rheumatic joint disease (HCC) 12/03/2011   Primary osteoarthritis of left knee 12/03/2011    History obtained from: chart review and patient.  Discussed the use of AI scribe software for clinical note transcription with the patient and/or guardian, who gave verbal consent to proceed.  Laura Allen is a 74 y.o. female presenting for a follow up visit.  She was last seen in March 2025 by Arlean Mutter one of our nurse practitioners.  At that time, she was continued on Breo 100 mcg 1 puff daily as well as albuterol .  For her rhinitis, she was continued on cetirizine  and Dymista .  This combination has been working well for her.  Since last visit, she has done well.  Asthma/Respiratory Symptom History: Her asthma is well-controlled with Breo, and she has not needed to use her albuterol  inhaler recently.  She has not been on prednisone.  She is doing very well from a breathing perspective.  She is happy with how she is doing.  She denies any nighttime coughing or wheezing.  Allergic Rhinitis  Symptom History: She has been experiencing intermittent nasal congestion and headaches for several weeks. The congestion alternates between a sensation of a 'drooping nose' and being 'stopped up,' accompanied by headaches. She uses Dymista  nasal spray and Zyrtec , which provide some relief, but she continues to experience significant discomfort, particularly during the spring and fall seasons.  Infection Symptom History: She has a history of receiving cortisone  shots and antibiotics during severe allergy seasons but has not had antibiotics in years. She typically experiences yeast infections when taking antibiotics.  These are given by her PCP, but again she has not had it in 1 to 2 years.  Her RA is in remission. She stopped her Humira  around 18 months ago and is on the MTX once weekly. If she starts needing the Humira  again and having more pain, she was restart it. But right now they are are going to just try this. She has been in remission in the past. But she has always had to restart it.  She has lost over twenty pounds since her last visit by drinking water, avoiding sugar, and not eating after 7 PM. She is satisfied with these lifestyle changes and their impact on her health.  Socially, she recently went on a family trip to [facility] with her 40 year old mother, her daughter, and granddaughter, enjoying a multi-generational vacation. She also stays busy with activities like canning various foods.   Otherwise, there have been no changes to her past medical history, surgical history, family history, or social history.    Review of systems otherwise negative other than that mentioned in the HPI.    Objective:   Blood pressure 118/72, pulse (!) 118, temperature 97.9 F (36.6 C), temperature source Temporal, resp. rate 18, height 5' 5.75 (1.67 m), weight 221 lb 3.2 oz (100.3 kg), SpO2 96%. Body mass index is 35.98 kg/m.    Physical Exam Vitals reviewed.  Constitutional:      Appearance: She is well-developed.     Comments: Very delightful. Talkative. Smiling.  HENT:     Head: Normocephalic and atraumatic.     Right Ear: Tympanic membrane, ear canal and external ear normal.     Left Ear: Tympanic membrane, ear canal and external ear normal.     Nose: Nose normal. No nasal deformity, septal deviation, mucosal edema or rhinorrhea.     Right Turbinates: Enlarged, swollen and pale.     Left Turbinates: Enlarged, swollen and pale.      Right Sinus: No maxillary sinus tenderness or frontal sinus tenderness.     Left Sinus: No maxillary sinus tenderness or frontal sinus tenderness.     Comments: No polyps.    Mouth/Throat:     Mouth: Mucous membranes are not pale and not dry.     Pharynx: Uvula midline.     Comments: Mild cobblestoning. Eyes:     General: Lids are normal. No allergic shiner.       Right eye: No discharge.        Left eye: No discharge.     Conjunctiva/sclera: Conjunctivae normal.     Right eye: Right conjunctiva is not injected. No chemosis.    Left eye: Left conjunctiva is not injected. No chemosis.    Pupils: Pupils are equal, round, and reactive to light.  Cardiovascular:     Rate and Rhythm: Normal rate and regular rhythm.     Heart sounds: Normal heart sounds.  Pulmonary:     Effort: Pulmonary effort is normal. No  tachypnea, accessory muscle usage or respiratory distress.     Breath sounds: Normal breath sounds. No wheezing, rhonchi or rales.     Comments: Moving air well in all lung fields. No increased work of breathing noted.  Chest:     Chest wall: No tenderness.  Lymphadenopathy:     Cervical: No cervical adenopathy.  Skin:    General: Skin is warm.     Capillary Refill: Capillary refill takes less than 2 seconds.     Coloration: Skin is not pale.     Findings: No abrasion, erythema, petechiae or rash. Rash is not papular, urticarial or vesicular.     Comments: No eczematous or urticarial lesions noted.   Neurological:     Mental Status: She is alert.  Psychiatric:        Behavior: Behavior is cooperative.      Diagnostic studies:    Spirometry: results normal (FEV1: 2.79/115%, FVC: 3.61/114%, FEV1/FVC: 77%).    Spirometry consistent with normal pattern.    Allergy Studies: none        Marty Shaggy, MD  [facility] 

## 2024-08-04 NOTE — Patient Instructions (Addendum)
 1. Moderate persistent asthma, uncomplicated - Lung testing looks phenomenal today. - We are not going to make any medication changes.  - Everything seems to be working well to control your breathing.  - Daily controller medication(s): Breo 100/25mcg one puff once daily - Prior to physical activity: albuterol  2 puffs 10-15 minutes before physical activity. - Rescue medications: albuterol  4 puffs every 4-6 hours as needed or albuterol  nebulizer one vial every 4-6 hours as needed - Asthma control goals:  * Full participation in all desired activities (may need albuterol  before activity) * Albuterol  use two time or less a week on average (not counting use with activity) * Cough interfering with sleep two time or less a month * Oral steroids no more than once a year * No hospitalizations  2. Chronic rhinitis (grasses, weeds, trees, indoor molds, outdoor molds, dust mites, dog and cockroach) - I think you are doing very well.  - We are not going to make any changes at this point. - Continue taking: Zyrtec  (cetirizine ) 10mg  tablet once 1-2 times daily and Dymista  (fluticasone /azelastine ) two sprays per nostril 1-2 times daily - We can do allergy shots in the future if needed.  - This would cure you allergies, but it is a long time commitment.   3. Acute sinusitis  - Start Augmentin  twice daily for two weeks. - Diflucan  sent in to use if you develop a yeast infection. - Hopefully we can avoid needing prednisone since this is bad on your bones.   4. Return in about 6 months (around 02/01/2025).    Please inform us  of any Emergency Department visits, hospitalizations, or changes in symptoms. Call us  before going to the ED for breathing or allergy symptoms since we might be able to fit you in for a sick visit. Feel free to contact us  anytime with any questions, problems, or concerns.  It was a pleasure to see you again today!  Websites that have reliable patient information: 1. American  Academy of Asthma, Allergy, and Immunology: www.aaaai.org 2. Food Allergy Research and Education (FARE): foodallergy.org 3. Mothers of Asthmatics: http://www.asthmacommunitynetwork.org 4. American College of Allergy, Asthma, and Immunology: www.acaai.org   COVID-19 Vaccine Information can be found at: [email or web address] For questions related to vaccine distribution or appointments, please email [email or web address] or call [phone].     "Like" us  on Facebook and Instagram for our latest updates!      A healthy democracy works best when [employer] participate! Make sure you are registered to vote! If you have moved or changed any of your contact information, you will need to get this updated before voting! Scan the QR codes below to learn more!

## 2024-08-07 NOTE — Addendum Note (Signed)
 Addended by: JENEL MARYLYNN GRADE on: 08/07/2024 04:15 PM   Modules accepted: Orders

## 2024-09-25 ENCOUNTER — Other Ambulatory Visit: Payer: Self-pay | Admitting: Nurse Practitioner

## 2024-09-25 DIAGNOSIS — E039 Hypothyroidism, unspecified: Secondary | ICD-10-CM

## 2024-09-25 DIAGNOSIS — E042 Nontoxic multinodular goiter: Secondary | ICD-10-CM

## 2024-10-02 DIAGNOSIS — E039 Hypothyroidism, unspecified: Secondary | ICD-10-CM | POA: Diagnosis not present

## 2024-10-02 DIAGNOSIS — E042 Nontoxic multinodular goiter: Secondary | ICD-10-CM | POA: Diagnosis not present

## 2024-10-03 LAB — TSH: TSH: 2.7 u[IU]/mL (ref 0.450–4.500)

## 2024-10-03 LAB — T4, FREE: Free T4: 1.58 ng/dL (ref 0.82–1.77)

## 2024-10-09 ENCOUNTER — Encounter: Payer: Self-pay | Admitting: Nurse Practitioner

## 2024-10-09 ENCOUNTER — Ambulatory Visit (INDEPENDENT_AMBULATORY_CARE_PROVIDER_SITE_OTHER): Payer: Medicare Other | Admitting: Nurse Practitioner

## 2024-10-09 VITALS — BP 110/76 | HR 82 | Ht 65.75 in | Wt 219.2 lb

## 2024-10-09 DIAGNOSIS — E042 Nontoxic multinodular goiter: Secondary | ICD-10-CM

## 2024-10-09 DIAGNOSIS — E039 Hypothyroidism, unspecified: Secondary | ICD-10-CM | POA: Diagnosis not present

## 2024-10-09 MED ORDER — LEVOTHYROXINE SODIUM 100 MCG PO TABS: ORAL_TABLET | ORAL | 3 refills | Status: AC

## 2024-10-09 NOTE — Progress Notes (Signed)
 10/09/2024       Endocrinology Follow Up Visit  ---------------------------------------------------------------------------------------------------------------------------- SUBJECTIVE:  Past Medical History:  Diagnosis Date   Anemia    PMH   Asthma    Bronchitis    PMH   Family history of adverse reaction to anesthesia    mother gets PONV   H/O exercise stress test    [facility]   H/O seasonal allergies    Hearing aid worn    B/L   History of underactive thyroid     Hypertension    Hypothyroidism    Osteoarthritis (arthritis due to wear and tear of joints)    Pancreatitis    Rheumatoid arthritis(714.0)    Sleep apnea    mild; does not wear CPAP   Wears glasses     Past Surgical History:  Procedure Laterality Date   CATARACT EXTRACTION W/PHACO Left 09/04/2013   Procedure: CATARACT EXTRACTION PHACO AND INTRAOCULAR LENS PLACEMENT (IOC);  Surgeon: Cherene Mania, MD;  Location: [facility];  Service: Ophthalmology;  Laterality: Left;  CDE:  10.22   CATARACT EXTRACTION W/PHACO Right 09/14/2013   Procedure: CATARACT EXTRACTION PHACO AND INTRAOCULAR LENS PLACEMENT (IOC);  Surgeon: Cherene Mania, MD;  Location: [facility];  Service: Ophthalmology;  Laterality: Right;  CDE:8.81   CHOLECYSTECTOMY     COLONOSCOPY     COLONOSCOPY N/A 06/24/2018   Procedure: COLONOSCOPY;  Surgeon: Shaaron Lamar HERO, MD;  Location: [facility];  Service: Endoscopy;  Laterality: N/A;  9:45   KNEE ARTHROPLASTY Right 05/20/2015   Procedure: COMPUTER ASSISTED TOTAL KNEE ARTHROPLASTY;  Surgeon: Oneil JAYSON Herald, MD;  Location: [facility];  Service: Orthopedics;  Laterality: Right;   OVARIAN CYST SURGERY     TOTAL KNEE ARTHROPLASTY Left 11/25/2016   Procedure: LEFT TOTAL KNEE ARTHROPLASTY;  Surgeon: Oneil JAYSON Herald, MD;  Location: [facility] OR;  Service: Orthopedics;  Laterality: Left;    Thyroid  Problem Presents for follow-up (She reports to have been diagnosed with hypothyroidism at age 73 yrs  and has been on various doses of  thyroid  hormone ever since, until it was stabilized at 100 g by mouth daily.) visit. Patient reports no anxiety, cold intolerance, constipation, depressed mood, diarrhea, fatigue, heat intolerance, palpitations, tremors, weight gain or weight loss. The symptoms have been stable.    Laura Allen is a 73 y.o.-year-old female, is being engaged in follow-up of hypothyroidism, and nodular goiter.   She is on Levothyroxine  100 mcg p.o. daily before breakfast, skipping 1 day of the week.  She is compliant. No new complaints.  Her weight stayed steady since last visit- lost a few pounds by changing her diet some.  She denies cold intolerance, heat intolerance. No h/o radiation tx to head or neck.   -She was found to have mildly suspicious 1 cm nodule on the left lobe of her thyroid , attempt to biopsy did not yield diagnostic material.  She has no complaints of dysphagia, voice change nor shortness of breath.  Her previsit thyroid  ultrasound is unremarkable.   ROS:  Review of systems  Constitutional: + decreasing body weight-intentional,  current Body mass index is 35.65 kg/m. , no fatigue, no subjective hyperthermia, no subjective hypothermia Eyes: no blurry vision, no xerophthalmia ENT: no sore throat, no nodules palpated in throat, no dysphagia/odynophagia, no hoarseness Cardiovascular: no chest pain, no shortness of breath, no palpitations, no leg swelling Respiratory: no cough, no shortness of breath Gastrointestinal: no nausea/vomiting/diarrhea Musculoskeletal: no muscle/joint aches Skin: no rashes, no hyperemia Neurological: no tremors, no numbness,  no tingling, no dizziness Psychiatric: no depression, no anxiety  ---------------------------------------------------------------------------------------------------------------------------- OBJECTIVE:  BP 110/76 (BP Location: Left Arm, Patient Position: Sitting, Cuff Size: Large)   Pulse 82   Ht 5' 5.75 (1.67 m)   Wt 219 lb 3.2 oz  (99.4 kg)   BMI 35.65 kg/m  Wt Readings from Last 3 Encounters:  10/09/24 219 lb 3.2 oz (99.4 kg)  08/04/24 221 lb 3.2 oz (100.3 kg)  02/02/24 225 lb 4 oz (102.2 kg)   BP Readings from Last 3 Encounters:  10/09/24 110/76  08/04/24 118/72  05/16/24 125/82     Physical Exam- Limited  Constitutional:  Body mass index is 35.65 kg/m. , not in acute distress, normal state of mind Eyes:  EOMI, no exophthalmos Musculoskeletal: no gross deformities, strength intact in all four extremities, no gross restriction of joint movements Skin:  no rashes, no hyperemia Neurological: no tremor with outstretched hands     Latest Reference Range & Units 09/25/21 09:49 09/28/22 11:05 09/29/23 09:58 10/02/24 10:42  TSH 0.450 - 4.500 uIU/mL 2.420 1.530 3.900 2.700  T4,Free(Direct) 0.82 - 1.77 ng/dL 8.32 8.11 (H) 8.39 8.41  (H): Data is abnormally high  ---------------------------------------------------------------------------------------------------------------------------- ASSESSMENT / PLAN: 1. Hypothyroidism   Patient with long-standing hypothyroidism, on levothyroxine  therapy.   -Her previsit TFTs are consistent with appropriate hormone replacement.  She is advised to continue Levothyroxine  100 mcg po daily before breakfast skipping 1 day out of the week (same day each week).  She is aware that if she continues to lose weight, her thyroid  hormone may need to be decreased as well.   - We discussed about the correct intake of her thyroid  hormone, on empty stomach at fasting, with water, separated by at least 30 minutes from breakfast and other medications,  and separated by more than 4 hours from calcium, iron, multivitamins, acid reflux medications (PPIs). -Patient is made aware of the fact that thyroid  hormone replacement is needed for life, dose to be adjusted by periodic monitoring of thyroid  function tests.  2. Multinodular goiter -  Her follow-up ultrasound of the thyroid  from 06/05/20 shows  stable findings, unremarkable and does not require dedicated follow up or surveillance moving forward.    I spent  26  minutes in the care of the patient today including review of labs from Thyroid  Function, CMP, and other relevant labs ; imaging/biopsy records (current and previous including abstractions from other facilities); face-to-face time discussing  her lab results and symptoms, medications doses, her options of short and long term treatment based on the latest standards of care / guidelines;   and documenting the encounter.  Laura Allen  participated in the discussions, expressed understanding, and voiced agreement with the above plans.  All questions were answered to her satisfaction. she is encouraged to contact clinic should she have any questions or concerns prior to her return visit.     Follow Up Plan: Return in about 1 year (around 10/09/2025) for Thyroid  follow up, Previsit labs.   Laura Allen, [facility] [address] [place], [place] [address] Phone: [phone] Fax: [phone]   10/09/2024, 11:04 AM

## 2024-10-26 DIAGNOSIS — M0579 Rheumatoid arthritis with rheumatoid factor of multiple sites without organ or systems involvement: Secondary | ICD-10-CM | POA: Diagnosis not present

## 2024-11-15 ENCOUNTER — Other Ambulatory Visit (HOSPITAL_COMMUNITY): Payer: Self-pay | Admitting: Rheumatology

## 2024-11-15 ENCOUNTER — Telehealth: Payer: Self-pay

## 2024-11-15 NOTE — Telephone Encounter (Addendum)
 Auth Submission: APPROVED Site of care: Site of care: [facility] Payer: [employer] [place] Medication & CPT/J Code(s) submitted: Prolia  (Denosumab ) [id number] Diagnosis Code:  Route of submission (phone, fax, portal): portal Phone # Fax # Auth type: Buy/Bill PB Units/visits requested: 60mg  q12months x 2 doses Reference number: [id number] Approval from: 11/14/24 to 11/14/25   Auth Submission: APPROVED Site of care: Site of care: [facility] Payer: [employer] state Medication & CPT/J Code(s) submitted: Prolia  (Denosumab ) [id number] Diagnosis Code:  Route of submission (phone, fax, portal): portal Phone # Fax # Auth type: Buy/Bill PB Units/visits requested: 60mg  q26months x 2 doses Reference number: [id number] Approval from:  11/15/24 to  11/14/25

## 2024-11-17 ENCOUNTER — Ambulatory Visit

## 2024-12-05 ENCOUNTER — Ambulatory Visit

## 2024-12-11 ENCOUNTER — Ambulatory Visit

## 2024-12-13 ENCOUNTER — Ambulatory Visit

## 2024-12-13 ENCOUNTER — Encounter: Payer: Self-pay | Admitting: Rheumatology

## 2024-12-13 VITALS — BP 116/79 | HR 91 | Temp 97.8°F | Resp 18

## 2024-12-13 DIAGNOSIS — M810 Age-related osteoporosis without current pathological fracture: Secondary | ICD-10-CM

## 2024-12-13 MED ORDER — DENOSUMAB 60 MG/ML ~~LOC~~ SOSY
60.0000 mg | PREFILLED_SYRINGE | Freq: Once | SUBCUTANEOUS | Status: AC
  Administered 2024-12-13: 60 mg via SUBCUTANEOUS

## 2024-12-13 NOTE — Progress Notes (Signed)
 Diagnosis:  Osteoporosis  Provider:  Ishmael Slough MD  Procedure: Injection  Prolia  (Denosumab ), Dose: 60 mg, Site: subcutaneous, Number of injections: 1  Injection Site(s): Left arm  Post Care: Observation period completed  Discharge: Condition: Good, Destination: Home . AVS Declined  Performed by:  Ashwin Tibbs R, LPN

## 2025-02-28 ENCOUNTER — Ambulatory Visit: Admitting: Allergy & Immunology

## 2025-06-13 ENCOUNTER — Ambulatory Visit

## 2025-10-09 ENCOUNTER — Ambulatory Visit: Admitting: Nurse Practitioner
